# Patient Record
Sex: Female | Born: 1950 | Race: White | Hispanic: No | Marital: Married | Smoking: Never smoker
Health system: Southern US, Community
[De-identification: ages and names within clinical notes are randomized; demographics above are authoritative.]

## PROBLEM LIST (undated history)

## (undated) DIAGNOSIS — R0989 Other specified symptoms and signs involving the circulatory and respiratory systems: Secondary | ICD-10-CM

## (undated) DIAGNOSIS — I10 Essential (primary) hypertension: Secondary | ICD-10-CM

## (undated) DIAGNOSIS — M1990 Unspecified osteoarthritis, unspecified site: Secondary | ICD-10-CM

## (undated) DIAGNOSIS — F411 Generalized anxiety disorder: Secondary | ICD-10-CM

## (undated) DIAGNOSIS — K219 Gastro-esophageal reflux disease without esophagitis: Secondary | ICD-10-CM

## (undated) DIAGNOSIS — C4491 Basal cell carcinoma of skin, unspecified: Secondary | ICD-10-CM

## (undated) DIAGNOSIS — R112 Nausea with vomiting, unspecified: Secondary | ICD-10-CM

## (undated) DIAGNOSIS — T7840XA Allergy, unspecified, initial encounter: Secondary | ICD-10-CM

## (undated) DIAGNOSIS — I441 Atrioventricular block, second degree: Secondary | ICD-10-CM

## (undated) DIAGNOSIS — Z8719 Personal history of other diseases of the digestive system: Secondary | ICD-10-CM

## (undated) DIAGNOSIS — J189 Pneumonia, unspecified organism: Secondary | ICD-10-CM

## (undated) DIAGNOSIS — J309 Allergic rhinitis, unspecified: Secondary | ICD-10-CM

## (undated) DIAGNOSIS — E785 Hyperlipidemia, unspecified: Secondary | ICD-10-CM

## (undated) DIAGNOSIS — D509 Iron deficiency anemia, unspecified: Secondary | ICD-10-CM

## (undated) DIAGNOSIS — R519 Headache, unspecified: Secondary | ICD-10-CM

## (undated) DIAGNOSIS — G8929 Other chronic pain: Secondary | ICD-10-CM

## (undated) HISTORY — DX: Unspecified osteoarthritis, unspecified site: M19.90

## (undated) HISTORY — DX: Allergy, unspecified, initial encounter: T78.40XA

## (undated) HISTORY — DX: Basal cell carcinoma of skin, unspecified: C44.91

## (undated) HISTORY — DX: Hyperlipidemia, unspecified: E78.5

## (undated) HISTORY — DX: Essential (primary) hypertension: I10

## (undated) HISTORY — DX: Headache, unspecified: R51.9

## (undated) HISTORY — DX: Generalized anxiety disorder: F41.1

## (undated) HISTORY — DX: Atrioventricular block, second degree: I44.1

## (undated) HISTORY — DX: Other chronic pain: G89.29

## (undated) HISTORY — DX: Other specified symptoms and signs involving the circulatory and respiratory systems: R09.89

## (undated) HISTORY — DX: Personal history of other diseases of the digestive system: Z87.19

## (undated) HISTORY — DX: Allergic rhinitis, unspecified: J30.9

## (undated) HISTORY — DX: Iron deficiency anemia, unspecified: D50.9

---
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
## 1969-12-10 HISTORY — PX: INGUINAL HERNIA REPAIR: SUR1180

## 1980-12-10 HISTORY — PX: TUBAL LIGATION: SHX77

## 1984-12-10 HISTORY — PX: KNEE CARTILAGE SURGERY: SHX688

## 1988-12-10 HISTORY — PX: VAGINAL HYSTERECTOMY: SUR661

## 1992-12-10 HISTORY — PX: LAPAROSCOPIC CHOLECYSTECTOMY: SUR755

## 1992-12-10 HISTORY — PX: ESOPHAGOGASTRODUODENOSCOPY: SHX1529

## 1999-06-08 ENCOUNTER — Other Ambulatory Visit: Admission: RE | Admit: 1999-06-08 | Discharge: 1999-06-08 | Payer: Self-pay | Admitting: Obstetrics and Gynecology

## 1999-12-11 HISTORY — PX: ANTERIOR CRUCIATE LIGAMENT REPAIR: SHX115

## 2000-01-24 ENCOUNTER — Encounter: Admission: RE | Admit: 2000-01-24 | Discharge: 2000-01-24 | Payer: Self-pay | Admitting: Infectious Diseases

## 2000-01-31 ENCOUNTER — Encounter: Admission: RE | Admit: 2000-01-31 | Discharge: 2000-01-31 | Payer: Self-pay | Admitting: Infectious Diseases

## 2000-02-21 ENCOUNTER — Encounter: Admission: RE | Admit: 2000-02-21 | Discharge: 2000-02-21 | Payer: Self-pay | Admitting: *Deleted

## 2000-05-07 ENCOUNTER — Ambulatory Visit (HOSPITAL_BASED_OUTPATIENT_CLINIC_OR_DEPARTMENT_OTHER): Admission: RE | Admit: 2000-05-07 | Discharge: 2000-05-08 | Payer: Self-pay | Admitting: Orthopedic Surgery

## 2000-05-09 ENCOUNTER — Emergency Department (HOSPITAL_COMMUNITY): Admission: EM | Admit: 2000-05-09 | Discharge: 2000-05-09 | Payer: Self-pay | Admitting: Emergency Medicine

## 2000-07-09 ENCOUNTER — Other Ambulatory Visit: Admission: RE | Admit: 2000-07-09 | Discharge: 2000-07-09 | Payer: Self-pay | Admitting: Obstetrics and Gynecology

## 2000-12-10 HISTORY — PX: BREAST LUMPECTOMY: SHX2

## 2001-02-07 ENCOUNTER — Other Ambulatory Visit: Admission: RE | Admit: 2001-02-07 | Discharge: 2001-02-07 | Payer: Self-pay | Admitting: *Deleted

## 2001-02-07 ENCOUNTER — Encounter (INDEPENDENT_AMBULATORY_CARE_PROVIDER_SITE_OTHER): Payer: Self-pay | Admitting: Specialist

## 2001-03-27 ENCOUNTER — Encounter: Payer: Self-pay | Admitting: Occupational Medicine

## 2001-03-27 ENCOUNTER — Encounter: Admission: RE | Admit: 2001-03-27 | Discharge: 2001-03-27 | Payer: Self-pay | Admitting: Occupational Medicine

## 2001-07-22 ENCOUNTER — Other Ambulatory Visit: Admission: RE | Admit: 2001-07-22 | Discharge: 2001-07-22 | Payer: Self-pay | Admitting: Obstetrics and Gynecology

## 2002-01-20 ENCOUNTER — Ambulatory Visit (HOSPITAL_COMMUNITY): Admission: RE | Admit: 2002-01-20 | Discharge: 2002-01-20 | Payer: Self-pay | Admitting: Gastroenterology

## 2005-03-20 ENCOUNTER — Ambulatory Visit: Payer: Self-pay | Admitting: Internal Medicine

## 2005-08-30 ENCOUNTER — Ambulatory Visit: Payer: Self-pay | Admitting: Internal Medicine

## 2005-09-10 ENCOUNTER — Ambulatory Visit: Payer: Self-pay | Admitting: Internal Medicine

## 2005-09-25 ENCOUNTER — Ambulatory Visit: Payer: Self-pay | Admitting: Internal Medicine

## 2006-10-15 ENCOUNTER — Ambulatory Visit: Payer: Self-pay | Admitting: Internal Medicine

## 2006-10-15 LAB — CONVERTED CEMR LAB
ALT: 21 units/L (ref 0–40)
AST: 23 units/L (ref 0–37)
Albumin: 3.7 g/dL (ref 3.5–5.2)
Alkaline Phosphatase: 57 units/L (ref 39–117)
BUN: 15 mg/dL (ref 6–23)
Basophils Absolute: 0 10*3/uL (ref 0.0–0.1)
Basophils Relative: 1 % (ref 0.0–1.0)
Bilirubin Urine: NEGATIVE
CO2: 29 meq/L (ref 19–32)
Calcium: 9.2 mg/dL (ref 8.4–10.5)
Chloride: 106 meq/L (ref 96–112)
Chol/HDL Ratio, serum: 2.7
Cholesterol: 177 mg/dL (ref 0–200)
Creatinine, Ser: 0.7 mg/dL (ref 0.4–1.2)
Eosinophil percent: 7.1 % — ABNORMAL HIGH (ref 0.0–5.0)
GFR calc non Af Amer: 92 mL/min
Glomerular Filtration Rate, Af Am: 111 mL/min/{1.73_m2}
Glucose, Bld: 90 mg/dL (ref 70–99)
HCT: 42.1 % (ref 36.0–46.0)
HDL: 66.2 mg/dL (ref >39.0)
Hemoglobin: 14.1 g/dL (ref 12.0–15.0)
Ketones, ur: NEGATIVE mg/dL
LDL Cholesterol: 99 mg/dL (ref 0–99)
Leukocytes, UA: NEGATIVE
Lymphocytes Relative: 30 % (ref 12.0–46.0)
MCHC: 33.4 g/dL (ref 30.0–36.0)
MCV: 95.4 fL (ref 78.0–100.0)
Monocytes Absolute: 0.5 10*3/uL (ref 0.2–0.7)
Monocytes Relative: 15.4 % — ABNORMAL HIGH (ref 3.0–11.0)
Neutro Abs: 1.7 10*3/uL (ref 1.4–7.7)
Neutrophils Relative %: 46.5 % (ref 43.0–77.0)
Nitrite: NEGATIVE
Platelets: 344 10*3/uL (ref 150–400)
Potassium: 4.2 meq/L (ref 3.5–5.1)
RBC: 4.41 M/uL (ref 3.87–5.11)
RDW: 12.4 % (ref 11.5–14.6)
Sodium: 140 meq/L (ref 135–145)
Specific Gravity, Urine: 1.02 (ref 1.000–1.03)
TSH: 2.38 microintl units/mL (ref 0.35–5.50)
Total Bilirubin: 0.6 mg/dL (ref 0.3–1.2)
Total Protein, Urine: NEGATIVE mg/dL
Total Protein: 6.3 g/dL (ref 6.0–8.3)
Triglyceride fasting, serum: 59 mg/dL (ref 0–149)
Urine Glucose: NEGATIVE mg/dL
Urobilinogen, UA: 0.2 (ref 0.0–1.0)
VLDL: 12 mg/dL (ref 0–40)
WBC: 3.4 10*3/uL — ABNORMAL LOW (ref 4.5–10.5)
pH: 6 (ref 5.0–8.0)

## 2006-10-22 ENCOUNTER — Ambulatory Visit: Payer: Self-pay | Admitting: Internal Medicine

## 2007-03-20 ENCOUNTER — Ambulatory Visit: Payer: Self-pay | Admitting: Internal Medicine

## 2007-09-08 ENCOUNTER — Ambulatory Visit: Payer: Self-pay | Admitting: Internal Medicine

## 2007-09-11 ENCOUNTER — Ambulatory Visit: Payer: Self-pay | Admitting: Internal Medicine

## 2007-09-19 ENCOUNTER — Encounter: Payer: Self-pay | Admitting: Internal Medicine

## 2007-09-19 ENCOUNTER — Ambulatory Visit: Payer: Self-pay | Admitting: Internal Medicine

## 2007-09-19 DIAGNOSIS — D509 Iron deficiency anemia, unspecified: Secondary | ICD-10-CM

## 2007-09-19 DIAGNOSIS — F419 Anxiety disorder, unspecified: Secondary | ICD-10-CM | POA: Insufficient documentation

## 2007-09-19 DIAGNOSIS — J309 Allergic rhinitis, unspecified: Secondary | ICD-10-CM | POA: Insufficient documentation

## 2007-09-19 DIAGNOSIS — J069 Acute upper respiratory infection, unspecified: Secondary | ICD-10-CM | POA: Insufficient documentation

## 2007-09-19 DIAGNOSIS — F411 Generalized anxiety disorder: Secondary | ICD-10-CM

## 2007-09-19 DIAGNOSIS — E785 Hyperlipidemia, unspecified: Secondary | ICD-10-CM | POA: Insufficient documentation

## 2007-09-19 DIAGNOSIS — M1990 Unspecified osteoarthritis, unspecified site: Secondary | ICD-10-CM

## 2007-09-19 DIAGNOSIS — R21 Rash and other nonspecific skin eruption: Secondary | ICD-10-CM | POA: Insufficient documentation

## 2007-09-19 HISTORY — DX: Generalized anxiety disorder: F41.1

## 2007-09-19 HISTORY — DX: Allergic rhinitis, unspecified: J30.9

## 2007-09-19 HISTORY — DX: Iron deficiency anemia, unspecified: D50.9

## 2007-09-19 HISTORY — DX: Unspecified osteoarthritis, unspecified site: M19.90

## 2007-10-28 ENCOUNTER — Ambulatory Visit: Payer: Self-pay | Admitting: Internal Medicine

## 2007-10-28 ENCOUNTER — Encounter: Payer: Self-pay | Admitting: Internal Medicine

## 2007-11-07 ENCOUNTER — Ambulatory Visit: Payer: Self-pay | Admitting: Internal Medicine

## 2007-11-07 LAB — CONVERTED CEMR LAB
ALT: 26 units/L (ref 0–35)
AST: 22 units/L (ref 0–37)
Albumin: 3.9 g/dL (ref 3.5–5.2)
Alkaline Phosphatase: 53 units/L (ref 39–117)
BUN: 13 mg/dL (ref 6–23)
Basophils Absolute: 0 10*3/uL (ref 0.0–0.1)
Basophils Relative: 0.9 % (ref 0.0–1.0)
Bilirubin Urine: NEGATIVE
Bilirubin, Direct: 0.1 mg/dL (ref 0.0–0.3)
CO2: 31 meq/L (ref 19–32)
Calcium: 9.1 mg/dL (ref 8.4–10.5)
Chloride: 104 meq/L (ref 96–112)
Cholesterol: 178 mg/dL (ref 0–200)
Creatinine, Ser: 0.7 mg/dL (ref 0.4–1.2)
Eosinophils Absolute: 0.2 10*3/uL (ref 0.0–0.6)
Eosinophils Relative: 3.9 % (ref 0.0–5.0)
GFR calc Af Amer: 111 mL/min
GFR calc non Af Amer: 91 mL/min
Glucose, Bld: 84 mg/dL (ref 70–99)
HCT: 39.8 % (ref 36.0–46.0)
HDL: 82 mg/dL (ref >39.0)
Hemoglobin: 14.2 g/dL (ref 12.0–15.0)
Ketones, ur: NEGATIVE mg/dL
LDL Cholesterol: 85 mg/dL (ref 0–99)
Leukocytes, UA: NEGATIVE
Lymphocytes Relative: 29 % (ref 12.0–46.0)
MCHC: 35.7 g/dL (ref 30.0–36.0)
MCV: 95.1 fL (ref 78.0–100.0)
Monocytes Absolute: 0.6 10*3/uL (ref 0.2–0.7)
Monocytes Relative: 14.6 % — ABNORMAL HIGH (ref 3.0–11.0)
Neutro Abs: 2.2 10*3/uL (ref 1.4–7.7)
Neutrophils Relative %: 51.6 % (ref 43.0–77.0)
Nitrite: NEGATIVE
Platelets: 308 10*3/uL (ref 150–400)
Potassium: 3.8 meq/L (ref 3.5–5.1)
RBC: 4.19 M/uL (ref 3.87–5.11)
RDW: 12.9 % (ref 11.5–14.6)
Sodium: 141 meq/L (ref 135–145)
Specific Gravity, Urine: >=1.03 (ref 1.000–1.03)
TSH: 2.33 microintl units/mL (ref 0.35–5.50)
Total Bilirubin: 0.6 mg/dL (ref 0.3–1.2)
Total CHOL/HDL Ratio: 2.2
Total Protein, Urine: NEGATIVE mg/dL
Total Protein: 6.2 g/dL (ref 6.0–8.3)
Triglycerides: 53 mg/dL (ref 0–149)
Urine Glucose: NEGATIVE mg/dL
Urobilinogen, UA: 0.2 (ref 0.0–1.0)
VLDL: 11 mg/dL (ref 0–40)
WBC: 4.2 10*3/uL — ABNORMAL LOW (ref 4.5–10.5)
pH: 5 (ref 5.0–8.0)

## 2007-11-19 ENCOUNTER — Ambulatory Visit: Payer: Self-pay | Admitting: Internal Medicine

## 2007-11-19 DIAGNOSIS — J329 Chronic sinusitis, unspecified: Secondary | ICD-10-CM | POA: Insufficient documentation

## 2008-02-23 ENCOUNTER — Encounter: Payer: Self-pay | Admitting: Internal Medicine

## 2008-12-15 ENCOUNTER — Telehealth: Payer: Self-pay | Admitting: Internal Medicine

## 2008-12-15 ENCOUNTER — Encounter: Payer: Self-pay | Admitting: Internal Medicine

## 2008-12-15 ENCOUNTER — Ambulatory Visit: Payer: Self-pay | Admitting: Internal Medicine

## 2008-12-15 DIAGNOSIS — J209 Acute bronchitis, unspecified: Secondary | ICD-10-CM | POA: Insufficient documentation

## 2008-12-15 DIAGNOSIS — R030 Elevated blood-pressure reading, without diagnosis of hypertension: Secondary | ICD-10-CM | POA: Insufficient documentation

## 2008-12-15 DIAGNOSIS — J45991 Cough variant asthma: Secondary | ICD-10-CM | POA: Insufficient documentation

## 2008-12-15 DIAGNOSIS — L500 Allergic urticaria: Secondary | ICD-10-CM | POA: Insufficient documentation

## 2008-12-20 ENCOUNTER — Ambulatory Visit: Payer: Self-pay | Admitting: Internal Medicine

## 2008-12-20 LAB — CONVERTED CEMR LAB
ALT: 16 units/L (ref 0–35)
AST: 20 units/L (ref 0–37)
Albumin: 4.1 g/dL (ref 3.5–5.2)
Alkaline Phosphatase: 62 units/L (ref 39–117)
BUN: 12 mg/dL (ref 6–23)
Basophils Absolute: 0 10*3/uL (ref 0.0–0.1)
Basophils Relative: 0.9 % (ref 0.0–3.0)
Bilirubin Urine: NEGATIVE
Bilirubin, Direct: 0.1 mg/dL (ref 0.0–0.3)
CO2: 31 meq/L (ref 19–32)
Calcium: 9.6 mg/dL (ref 8.4–10.5)
Chloride: 105 meq/L (ref 96–112)
Cholesterol: 172 mg/dL (ref 0–200)
Creatinine, Ser: 0.7 mg/dL (ref 0.4–1.2)
Eosinophils Absolute: 0.1 10*3/uL (ref 0.0–0.7)
Eosinophils Relative: 2.6 % (ref 0.0–5.0)
GFR calc Af Amer: 110 mL/min
GFR calc non Af Amer: 91 mL/min
Glucose, Bld: 95 mg/dL (ref 70–99)
HCT: 42.8 % (ref 36.0–46.0)
HDL: 61.6 mg/dL (ref >39.0)
Hemoglobin, Urine: NEGATIVE
Hemoglobin: 15.1 g/dL — ABNORMAL HIGH (ref 12.0–15.0)
Ketones, ur: NEGATIVE mg/dL
LDL Cholesterol: 97 mg/dL (ref 0–99)
Leukocytes, UA: NEGATIVE
Lymphocytes Relative: 21.9 % (ref 12.0–46.0)
MCHC: 35.2 g/dL (ref 30.0–36.0)
MCV: 95.1 fL (ref 78.0–100.0)
Monocytes Absolute: 0.4 10*3/uL (ref 0.1–1.0)
Monocytes Relative: 7.3 % (ref 3.0–12.0)
Neutro Abs: 3.5 10*3/uL (ref 1.4–7.7)
Neutrophils Relative %: 67.3 % (ref 43.0–77.0)
Nitrite: NEGATIVE
Platelets: 331 10*3/uL (ref 150–400)
Potassium: 4.7 meq/L (ref 3.5–5.1)
RBC: 4.5 M/uL (ref 3.87–5.11)
RDW: 12.4 % (ref 11.5–14.6)
Sodium: 141 meq/L (ref 135–145)
Specific Gravity, Urine: 1.025 (ref 1.000–1.03)
TSH: 1.96 microintl units/mL (ref 0.35–5.50)
Total Bilirubin: 0.8 mg/dL (ref 0.3–1.2)
Total CHOL/HDL Ratio: 2.8
Total Protein, Urine: NEGATIVE mg/dL
Total Protein: 6.5 g/dL (ref 6.0–8.3)
Triglycerides: 69 mg/dL (ref 0–149)
Urine Glucose: NEGATIVE mg/dL
Urobilinogen, UA: 0.2 (ref 0.0–1.0)
VLDL: 14 mg/dL (ref 0–40)
WBC: 5.1 10*3/uL (ref 4.5–10.5)
pH: 5.5 (ref 5.0–8.0)

## 2008-12-27 ENCOUNTER — Ambulatory Visit: Payer: Self-pay | Admitting: Internal Medicine

## 2008-12-27 DIAGNOSIS — L509 Urticaria, unspecified: Secondary | ICD-10-CM | POA: Insufficient documentation

## 2009-05-02 ENCOUNTER — Ambulatory Visit: Payer: Self-pay | Admitting: Internal Medicine

## 2009-05-02 DIAGNOSIS — H6120 Impacted cerumen, unspecified ear: Secondary | ICD-10-CM | POA: Insufficient documentation

## 2009-05-02 DIAGNOSIS — H6690 Otitis media, unspecified, unspecified ear: Secondary | ICD-10-CM | POA: Insufficient documentation

## 2009-05-23 ENCOUNTER — Telehealth: Payer: Self-pay | Admitting: Internal Medicine

## 2009-06-22 ENCOUNTER — Telehealth: Payer: Self-pay | Admitting: Internal Medicine

## 2009-06-30 ENCOUNTER — Encounter (INDEPENDENT_AMBULATORY_CARE_PROVIDER_SITE_OTHER): Payer: Self-pay | Admitting: *Deleted

## 2009-10-05 ENCOUNTER — Ambulatory Visit: Payer: Self-pay | Admitting: Internal Medicine

## 2009-12-30 ENCOUNTER — Ambulatory Visit: Payer: Self-pay | Admitting: Internal Medicine

## 2010-01-02 ENCOUNTER — Ambulatory Visit: Payer: Self-pay | Admitting: Internal Medicine

## 2010-01-02 LAB — CONVERTED CEMR LAB
ALT: 19 units/L (ref 0–35)
AST: 20 units/L (ref 0–37)
Albumin: 3.8 g/dL (ref 3.5–5.2)
Alkaline Phosphatase: 74 units/L (ref 39–117)
BUN: 12 mg/dL (ref 6–23)
Basophils Absolute: 0.1 10*3/uL (ref 0.0–0.1)
Basophils Relative: 1.4 % (ref 0.0–3.0)
Bilirubin Urine: NEGATIVE
Bilirubin, Direct: 0.1 mg/dL (ref 0.0–0.3)
CO2: 30 meq/L (ref 19–32)
Calcium: 9.3 mg/dL (ref 8.4–10.5)
Chloride: 105 meq/L (ref 96–112)
Cholesterol: 160 mg/dL (ref 0–200)
Creatinine, Ser: 0.7 mg/dL (ref 0.4–1.2)
Eosinophils Absolute: 0.3 10*3/uL (ref 0.0–0.7)
Eosinophils Relative: 6.8 % — ABNORMAL HIGH (ref 0.0–5.0)
GFR calc non Af Amer: 90.56 mL/min (ref >60)
Glucose, Bld: 84 mg/dL (ref 70–99)
HCT: 41.4 % (ref 36.0–46.0)
HDL: 57.8 mg/dL (ref >39.00)
Hemoglobin: 14.1 g/dL (ref 12.0–15.0)
Ketones, ur: NEGATIVE mg/dL
LDL Cholesterol: 88 mg/dL (ref 0–99)
Leukocytes, UA: NEGATIVE
Lymphocytes Relative: 24.5 % (ref 12.0–46.0)
Lymphs Abs: 1 10*3/uL (ref 0.7–4.0)
MCHC: 34 g/dL (ref 30.0–36.0)
MCV: 96.6 fL (ref 78.0–100.0)
Monocytes Absolute: 0.4 10*3/uL (ref 0.1–1.0)
Monocytes Relative: 10.5 % (ref 3.0–12.0)
Neutro Abs: 2.1 10*3/uL (ref 1.4–7.7)
Neutrophils Relative %: 56.8 % (ref 43.0–77.0)
Nitrite: NEGATIVE
Platelets: 306 10*3/uL (ref 150.0–400.0)
Potassium: 4.4 meq/L (ref 3.5–5.1)
RBC: 4.29 M/uL (ref 3.87–5.11)
RDW: 12.6 % (ref 11.5–14.6)
Sodium: 141 meq/L (ref 135–145)
Specific Gravity, Urine: 1.025 (ref 1.000–1.030)
TSH: 1.65 microintl units/mL (ref 0.35–5.50)
Total Bilirubin: 0.7 mg/dL (ref 0.3–1.2)
Total CHOL/HDL Ratio: 3
Total Protein, Urine: NEGATIVE mg/dL
Total Protein: 6.4 g/dL (ref 6.0–8.3)
Triglycerides: 73 mg/dL (ref 0.0–149.0)
Urine Glucose: NEGATIVE mg/dL
Urobilinogen, UA: 0.2 (ref 0.0–1.0)
VLDL: 14.6 mg/dL (ref 0.0–40.0)
WBC: 3.9 10*3/uL — ABNORMAL LOW (ref 4.5–10.5)
pH: 5.5 (ref 5.0–8.0)

## 2010-01-06 ENCOUNTER — Ambulatory Visit: Payer: Self-pay | Admitting: Internal Medicine

## 2010-01-06 DIAGNOSIS — M25519 Pain in unspecified shoulder: Secondary | ICD-10-CM | POA: Insufficient documentation

## 2010-01-06 DIAGNOSIS — M79609 Pain in unspecified limb: Secondary | ICD-10-CM | POA: Insufficient documentation

## 2010-01-23 ENCOUNTER — Ambulatory Visit: Payer: Self-pay | Admitting: Internal Medicine

## 2010-01-23 ENCOUNTER — Ambulatory Visit: Payer: Self-pay | Admitting: Family Medicine

## 2010-01-25 ENCOUNTER — Telehealth: Payer: Self-pay | Admitting: Internal Medicine

## 2010-01-25 DIAGNOSIS — R930 Abnormal findings on diagnostic imaging of skull and head, not elsewhere classified: Secondary | ICD-10-CM | POA: Insufficient documentation

## 2010-01-27 ENCOUNTER — Ambulatory Visit: Payer: Self-pay | Admitting: Cardiology

## 2010-01-27 ENCOUNTER — Telehealth: Payer: Self-pay | Admitting: Internal Medicine

## 2010-07-14 ENCOUNTER — Encounter: Payer: Self-pay | Admitting: Internal Medicine

## 2010-07-24 ENCOUNTER — Ambulatory Visit: Payer: Self-pay | Admitting: Internal Medicine

## 2010-07-24 DIAGNOSIS — R51 Headache: Secondary | ICD-10-CM

## 2010-09-08 ENCOUNTER — Ambulatory Visit: Payer: Self-pay | Admitting: Internal Medicine

## 2010-09-20 ENCOUNTER — Encounter: Payer: Self-pay | Admitting: Internal Medicine

## 2011-01-01 ENCOUNTER — Ambulatory Visit
Admission: RE | Admit: 2011-01-01 | Discharge: 2011-01-01 | Payer: Self-pay | Source: Home / Self Care | Attending: Internal Medicine | Admitting: Internal Medicine

## 2011-01-01 ENCOUNTER — Other Ambulatory Visit: Payer: Self-pay | Admitting: Internal Medicine

## 2011-01-01 LAB — URINALYSIS
Bilirubin Urine: NEGATIVE
Ketones, ur: NEGATIVE
Leukocytes, UA: NEGATIVE
Nitrite: NEGATIVE
Specific Gravity, Urine: <=1.005 (ref 1.000–1.030)
Total Protein, Urine: NEGATIVE
Urine Glucose: NEGATIVE
Urobilinogen, UA: 0.2 (ref 0.0–1.0)
pH: 6.5 (ref 5.0–8.0)

## 2011-01-01 LAB — HEPATIC FUNCTION PANEL
ALT: 25 U/L (ref 0–35)
AST: 27 U/L (ref 0–37)
Albumin: 4.2 g/dL (ref 3.5–5.2)
Alkaline Phosphatase: 78 U/L (ref 39–117)
Bilirubin, Direct: 0.1 mg/dL (ref 0.0–0.3)
Total Bilirubin: 0.7 mg/dL (ref 0.3–1.2)
Total Protein: 6.5 g/dL (ref 6.0–8.3)

## 2011-01-01 LAB — LIPID PANEL
Cholesterol: 173 mg/dL (ref 0–200)
HDL: 68.5 mg/dL (ref >39.00)
LDL Cholesterol: 90 mg/dL (ref 0–99)
Total CHOL/HDL Ratio: 3
Triglycerides: 74 mg/dL (ref 0.0–149.0)
VLDL: 14.8 mg/dL (ref 0.0–40.0)

## 2011-01-01 LAB — CBC WITH DIFFERENTIAL/PLATELET
Basophils Absolute: 0 10*3/uL (ref 0.0–0.1)
Basophils Relative: 0.6 % (ref 0.0–3.0)
Eosinophils Absolute: 0.2 10*3/uL (ref 0.0–0.7)
Eosinophils Relative: 4.5 % (ref 0.0–5.0)
HCT: 42.6 % (ref 36.0–46.0)
Hemoglobin: 14.7 g/dL (ref 12.0–15.0)
Lymphocytes Relative: 23 % (ref 12.0–46.0)
Lymphs Abs: 1.1 10*3/uL (ref 0.7–4.0)
MCHC: 34.4 g/dL (ref 30.0–36.0)
MCV: 95.7 fl (ref 78.0–100.0)
Monocytes Absolute: 0.6 10*3/uL (ref 0.1–1.0)
Monocytes Relative: 11.5 % (ref 3.0–12.0)
Neutro Abs: 3 10*3/uL (ref 1.4–7.7)
Neutrophils Relative %: 60.4 % (ref 43.0–77.0)
Platelets: 323 10*3/uL (ref 150.0–400.0)
RBC: 4.45 Mil/uL (ref 3.87–5.11)
RDW: 12.8 % (ref 11.5–14.6)
WBC: 4.9 10*3/uL (ref 4.5–10.5)

## 2011-01-01 LAB — BASIC METABOLIC PANEL
BUN: 11 mg/dL (ref 6–23)
CO2: 29 mEq/L (ref 19–32)
Calcium: 9.8 mg/dL (ref 8.4–10.5)
Chloride: 104 mEq/L (ref 96–112)
Creatinine, Ser: 0.6 mg/dL (ref 0.4–1.2)
GFR: 103.83 mL/min (ref >60.00)
Glucose, Bld: 81 mg/dL (ref 70–99)
Potassium: 4.7 mEq/L (ref 3.5–5.1)
Sodium: 140 mEq/L (ref 135–145)

## 2011-01-01 LAB — TSH: TSH: 2.01 u[IU]/mL (ref 0.35–5.50)

## 2011-01-08 ENCOUNTER — Encounter: Payer: Self-pay | Admitting: Internal Medicine

## 2011-01-08 ENCOUNTER — Ambulatory Visit
Admission: RE | Admit: 2011-01-08 | Discharge: 2011-01-08 | Payer: Self-pay | Source: Home / Self Care | Attending: Internal Medicine | Admitting: Internal Medicine

## 2011-01-08 DIAGNOSIS — R0989 Other specified symptoms and signs involving the circulatory and respiratory systems: Secondary | ICD-10-CM | POA: Insufficient documentation

## 2011-01-08 HISTORY — DX: Other specified symptoms and signs involving the circulatory and respiratory systems: R09.89

## 2011-01-09 ENCOUNTER — Encounter: Payer: Self-pay | Admitting: Internal Medicine

## 2011-01-09 NOTE — Assessment & Plan Note (Signed)
Summary: SINUS PROBLEM--STC   Vital Signs:  Patient profile:   60 year old female Height:      64 inches Weight:      170 pounds BMI:     29.29 O2 Sat:      97 % on Room air Temp:     98.3 degrees F oral Pulse rate:   76 / minute Pulse rhythm:   regular Resp:     16 per minute BP sitting:   120 / 60  (left arm) Cuff size:   regular  Vitals Entered By: Lanier Prude, CMA(AAMA) (July 24, 2010 4:18 PM)  O2 Flow:  Room air CC: sinus pain/pressure X 2 wks Is Patient Diabetic? No   CC:  sinus pain/pressure X 2 wks.  History of Present Illness: C/o severe constant HA in L eye, temple and L maxilla x 3-4 d like before w/sinusitis. OTC meds did not help She has a question about her BDS She is allergic to red dye F/u urticaria  Current Medications (verified): 1)  Aspir-Low 81 Mg Tbec (Aspirin) .Marland Kitchen.. 1 Once Daily Pc 2)  Alprazolam 0.25 Mg Tabs (Alprazolam) .Marland Kitchen.. 1 Two Times A Day Prn 3)  Vitamin D3 1000 Unit  Tabs (Cholecalciferol) .Marland Kitchen.. 1 Qd 4)  Etodolac Cr 500 Mg  Tb24 (Etodolac) .Marland Kitchen.. 1 By Mouth Once Daily Pc 5)  Lipitor 20 Mg Tabs (Atorvastatin Calcium) .Marland Kitchen.. 1po Qd 6)  Epipen 2-Pak 0.3 Mg/0.5ml (1:1000) Devi (Epinephrine Hcl (Anaphylaxis)) .... As Dirr 7)  Hydrocodone-Homatropine 5-1.5 Mg/60ml Syrp (Hydrocodone-Homatropine) .Marland Kitchen.. 1 Tsp By Mouth Q 6 Hrs As Needed Cough 8)  Anti-Hist Allergy 25 Mg Tabs (Diphenhydramine Hcl) .... 2 Tabs Q4 H As Needed Hives 9)  Triamcinolone Acetonide 0.5 % Crea (Triamcinolone Acetonide) .... Apply Bid To Affected Area 10)  Fluticasone Propionate 50 Mcg/act  Susp (Fluticasone Propionate) .... 2 Sprays Each Nostril Once Daily 11)  Prednisone 10 Mg Tabs (Prednisone) .... Take 40mg  Qd For 3 Days, Then 20 Mg Qd For 3 Days, Then 10mg  Qd For 6 Days, Then Stop. Take Pc.  Allergies (verified): 1)  ! Sulfa 2)  ! Biaxin 3)  ! Food Color Red (Dye Fdc Red 40 (Allura Red)) 4)  ! * Depo Medrol  Review of Systems  The patient denies fever, hoarseness,  dyspnea on exertion, and abdominal pain.    Physical Exam  General:  alert and well-developed.    Head:  Normocephalic and atraumatic without obvious abnormalities. No apparent alopecia or balding. NT. Eyes:  No corneal or conjunctival inflammation noted. EOMI. Perrla.  Ears:  R TM w/slight bulging Nose:  External nasal examination shows no deformity or inflammation. Nasal mucosa are pink and moist without lesions or exudates. Mouth:  WNL Neck:  supple and cervical lymphadenopathy.   Lungs:  Normal respiratory effort, chest expands symmetrically. Lungs are clear to auscultation, no crackles or wheezes. Heart:  Normal rate and regular rhythm. S1 and S2 normal without gallop, murmur, click, rub or other extra sounds. Skin:  Intact without suspicious lesions or rashes Psych:  Cognition and judgment appear intact. Alert and cooperative with normal attention span and concentration. No apparent delusions, illusions, hallucinations   Impression & Recommendations:  Problem # 1:  SINUSITIS, ACUTE (ICD-461.9) R maxillary Assessment New  Her updated medication list for this problem includes:    Hydrocodone-homatropine 5-1.5 Mg/46ml Syrp (Hydrocodone-homatropine) .Marland Kitchen... 1 tsp by mouth q 6 hrs as needed cough    Fluticasone Propionate 50 Mcg/act Susp (Fluticasone propionate) .Marland Kitchen... 2 sprays  each nostril once daily    Ceftin 250 Mg Tabs (Cefuroxime axetil) .Marland Kitchen... 2 by mouth two times a day for sinus infection    Sudafed 12 Hour 120 Mg Xr12h-tab (Pseudoephedrine hcl) .Marland Kitchen... 1 by mouth two times a day as needed allergies  Orders: Depo- Medrol 40mg  (J1030) Admin of Therapeutic Inj  intramuscular or subcutaneous ([address])  Problem # 2:  HEADACHE (ICD-784.0) R due to #1 Assessment: New  Her updated medication list for this problem includes:    Aspir-low 81 Mg Tbec (Aspirin) .Marland Kitchen... 1 once daily pc    Etodolac Cr 500 Mg Tb24 (Etodolac) .Marland Kitchen... 1 by mouth once daily pc  Problem # 3:  URTICARIA  (ICD-708.9) Assessment: Comment Only Take 40mg  qd for 3 days, then 20 mg qd for 3 days, then 10mg  qd for 6 days, then stop. Take pc.  - use PRN  Problem # 4:  BDS report was OK Discussed  Complete Medication List: 1)  Aspir-low 81 Mg Tbec (Aspirin) .Marland Kitchen.. 1 once daily pc 2)  Alprazolam 0.25 Mg Tabs (Alprazolam) .Marland Kitchen.. 1 two times a day prn 3)  Vitamin D3 1000 Unit Tabs (Cholecalciferol) .Marland Kitchen.. 1 qd 4)  Etodolac Cr 500 Mg Tb24 (Etodolac) .Marland Kitchen.. 1 by mouth once daily pc 5)  Lipitor 20 Mg Tabs (Atorvastatin calcium) .Marland Kitchen.. 1po qd 6)  Epipen 2-pak 0.3 Mg/0.67ml (1:1000) Devi (Epinephrine hcl (anaphylaxis)) .... As dirr 7)  Hydrocodone-homatropine 5-1.5 Mg/72ml Syrp (Hydrocodone-homatropine) .Marland Kitchen.. 1 tsp by mouth q 6 hrs as needed cough 8)  Anti-hist Allergy 25 Mg Tabs (Diphenhydramine hcl) .... 2 tabs q4 h as needed hives 9)  Triamcinolone Acetonide 0.5 % Crea (Triamcinolone acetonide) .... Apply bid to affected area 10)  Fluticasone Propionate 50 Mcg/act Susp (Fluticasone propionate) .... 2 sprays each nostril once daily 11)  Prednisone 10 Mg Tabs (Prednisone) .... Take 40mg  qd for 3 days, then 20 mg qd for 3 days, then 10mg  qd for 6 days, then stop. take pc. 12)  Ceftin 250 Mg Tabs (Cefuroxime axetil) .... 2 by mouth two times a day for sinus infection 13)  Sudafed 12 Hour 120 Mg Xr12h-tab (Pseudoephedrine hcl) .Marland Kitchen.. 1 by mouth two times a day as needed allergies  Patient Instructions: 1)  Call if you are not better in a reasonable amount of time or if worse.  Prescriptions: PREDNISONE 10 MG TABS (PREDNISONE) Take 40mg  qd for 3 days, then 20 mg qd for 3 days, then 10mg  qd for 6 days, then stop. Take pc.  #24 x 2   Entered and Authorized by:   Tresa Garter MD   Signed by:   Tresa Garter MD on 07/24/2010   Method used:   Print then Give to Patient   RxID:   [id number] SUDAFED 12 HOUR 120 MG XR12H-TAB (PSEUDOEPHEDRINE HCL) 1 by mouth two times a day as needed allergies  #60 x 1    Entered and Authorized by:   Tresa Garter MD   Signed by:   Tresa Garter MD on 07/24/2010   Method used:   Print then Give to Patient   RxID:   [id number] CEFTIN 250 MG TABS (CEFUROXIME AXETIL) 2 by mouth two times a day for sinus infection  #56 x 1   Entered and Authorized by:   Tresa Garter MD   Signed by:   Tresa Garter MD on 07/24/2010   Method used:   Print then Give to Patient   RxID:   [id number]  Medication Administration  Injection # 1:    Medication: Depo- Medrol 40mg     Diagnosis: SINUSITIS, ACUTE (ICD-461.9)    Route: IM    Site: LUOQ gluteus    Exp Date: 03/10/2013    Lot #: [id number]    Mfr: [employer]    Comments: pt rec 120mg     Patient tolerated injection without complications    Given by: Lanier Prude, CMA(AAMA) (July 24, 2010 5:06 PM)  Injection # 2:    Medication: Depo- Medrol 80mg     Comments: same as above.  pt rec 120mg   Orders Added: 1)  Depo- Medrol 40mg  [J1030] 2)  Admin of Therapeutic Inj  intramuscular or subcutaneous [96372] 3)  Est. Patient Level IV [31517]

## 2011-01-09 NOTE — Assessment & Plan Note (Signed)
Summary: EARACHE--STC   Vital Signs:  Patient profile:   60 year old female Height:      64 inches Weight:      167 pounds BMI:     28.77 Temp:     98.3 degrees F oral Pulse rate:   84 / minute Pulse rhythm:   regular Resp:     16 per minute BP sitting:   118 / 80  (left arm) Cuff size:   regular  Vitals Entered By: Lanier Prude, CMA(AAMA) (September 08, 2010 9:12 AM) CC: Lt ear ache, sinus pain & pressure X 2 wks Is Patient Diabetic? No   CC:  Lt ear ache and sinus pain & pressure X 2 wks.  History of Present Illness: C/o earache L>>R and sinus pain x 1 wk  Preventive Screening-Counseling & Management  Alcohol-Tobacco     Smoking Status: never     Passive Smoke Exposure: no  Current Medications (verified): 1)  Aspir-Low 81 Mg Tbec (Aspirin) .Marland Kitchen.. 1 Once Daily Pc 2)  Alprazolam 0.25 Mg Tabs (Alprazolam) .Marland Kitchen.. 1 Two Times A Day Prn 3)  Vitamin D3 1000 Unit  Tabs (Cholecalciferol) .Marland Kitchen.. 1 Qd 4)  Etodolac Cr 500 Mg  Tb24 (Etodolac) .Marland Kitchen.. 1 By Mouth Once Daily Pc 5)  Lipitor 20 Mg Tabs (Atorvastatin Calcium) .Marland Kitchen.. 1po Qd 6)  Epipen 2-Pak 0.3 Mg/0.47ml (1:1000) Devi (Epinephrine Hcl (Anaphylaxis)) .... As Dirr 7)  Hydrocodone-Homatropine 5-1.5 Mg/43ml Syrp (Hydrocodone-Homatropine) .Marland Kitchen.. 1 Tsp By Mouth Q 6 Hrs As Needed Cough 8)  Anti-Hist Allergy 25 Mg Tabs (Diphenhydramine Hcl) .... 2 Tabs Q4 H As Needed Hives 9)  Triamcinolone Acetonide 0.5 % Crea (Triamcinolone Acetonide) .... Apply Bid To Affected Area 10)  Fluticasone Propionate 50 Mcg/act  Susp (Fluticasone Propionate) .... 2 Sprays Each Nostril Once Daily 11)  Prednisone 10 Mg Tabs (Prednisone) .... Take 40mg  Qd For 3 Days, Then 20 Mg Qd For 3 Days, Then 10mg  Qd For 6 Days, Then Stop. Take Pc. 12)  Sudafed 12 Hour 120 Mg Xr12h-Tab (Pseudoephedrine Hcl) .Marland Kitchen.. 1 By Mouth Two Times A Day As Needed Allergies  Allergies (verified): 1)  ! Sulfa 2)  ! Biaxin 3)  ! Food Color Red (Dye Fdc Red 40 (Allura Red)) 4)  ! 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    Ears:  L TM w/bulging; R TM w/slight bulging Nose:  External nasal examination shows no deformity or inflammation. Nasal mucosa are pink and moist without lesions or exudates. Mouth:  WNL Lungs:  Normal respiratory effort, chest expands symmetrically. Lungs are clear to auscultation, no crackles or wheezes. Heart:  Normal rate and regular rhythm. S1 and S2 normal without gallop, murmur, click, rub or other extra sounds. Skin:  Intact without suspicious lesions or rashes   Impression & Recommendations:  Problem # 1:  OTITIS MEDIA, ACUTE, LEFT (ICD-382.9)L>R - keeps coming back Assessment New  Her updated medication list for this problem includes:    Aspir-low 81 Mg Tbec (Aspirin) .Marland Kitchen... 1 once daily pc    Etodolac Cr 500 Mg Tb24 (Etodolac) .Marland Kitchen... 1 by mouth once daily pc    Ceftin 250 Mg Tabs (Cefuroxime axetil) .Marland Kitchen... 2 by mouth bid  Orders: ENT Referral (ENT) ?? ear tubes  Problem # 2:  HEADACHE (ICD-784.0) Assessment: Deteriorated  Her updated medication list  for this problem includes:    Aspir-low 81 Mg Tbec (Aspirin) .Marland Kitchen... 1 once daily pc    Etodolac Cr 500 Mg Tb24 (Etodolac) .Marland Kitchen... 1 by mouth once daily pc  Complete Medication List: 1)  Aspir-low 81 Mg Tbec (Aspirin) .Marland Kitchen.. 1 once daily pc 2)  Alprazolam 0.25 Mg Tabs (Alprazolam) .Marland Kitchen.. 1 two times a day prn 3)  Vitamin D3 1000 Unit Tabs (Cholecalciferol) .Marland Kitchen.. 1 qd 4)  Etodolac Cr 500 Mg Tb24 (Etodolac) .Marland Kitchen.. 1 by mouth once daily pc 5)  Lipitor 20 Mg Tabs (Atorvastatin calcium) .Marland Kitchen.. 1po qd 6)  Epipen 2-pak 0.3 Mg/0.51ml (1:1000) Devi (Epinephrine hcl (anaphylaxis)) .... As dirr 7)  Hydrocodone-homatropine 5-1.5 Mg/42ml Syrp (Hydrocodone-homatropine) .Marland Kitchen.. 1 tsp by mouth q 6 hrs as needed  cough 8)  Anti-hist Allergy 25 Mg Tabs (Diphenhydramine hcl) .... 2 tabs q4 h as needed hives 9)  Triamcinolone Acetonide 0.5 % Crea (Triamcinolone acetonide) .... Apply bid to affected area 10)  Fluticasone Propionate 50 Mcg/act Susp (Fluticasone propionate) .... 2 sprays each nostril once daily 11)  Prednisone 10 Mg Tabs (Prednisone) .... Take 40mg  qd for 3 days, then 20 mg qd for 3 days, then 10mg  qd for 6 days, then stop. take pc. 12)  Sudafed 12 Hour 120 Mg Xr12h-tab (Pseudoephedrine hcl) .Marland Kitchen.. 1 by mouth two times a day as needed allergies 13)  Ceftin 250 Mg Tabs (Cefuroxime axetil) .... 2 by mouth bid 14)  Meclizine Hcl 12.5 Mg Tabs (Meclizine hcl) .Marland Kitchen.. 1-2 by mouth qid as needed dizziness  Patient Instructions: 1)  Call if you are not better in a reasonable amount of time or if worse.  Prescriptions: MECLIZINE HCL 12.5 MG TABS (MECLIZINE HCL) 1-2 by mouth qid as needed dizziness  #60 x 1   Entered and Authorized by:   Tresa Garter MD   Signed by:   Tresa Garter MD on 09/08/2010   Method used:   Electronically to        [facility]  [address] [id number] (retail)       [address]       [place], [place]  [address]       Ph: [phone] or [phone]       Fax: [phone]   RxID:   [id number] CEFTIN 250 MG TABS (CEFUROXIME AXETIL) 2 by mouth bid  #56 x 2   Entered and Authorized by:   Tresa Garter MD   Signed by:   Tresa Garter MD on 09/08/2010   Method used:   Electronically to        [facility]  [address] [id number] (retail)       [address]       [place], [place]  [address]       Ph: [phone] or [phone]       Fax: [phone]   RxID:   [id number] SUDAFED 12 HOUR 120 MG XR12H-TAB (PSEUDOEPHEDRINE HCL) 1 by mouth two times a day as needed allergies  #60 x 1   Entered and Authorized by:   Tresa Garter MD   Signed by:   Tresa Garter MD on 09/08/2010   Method used:   Electronically to         [facility]  [address] [id number]       [address]       [facility]  [place], [place]  [address]       Ph: [phone] or [phone]       Fax: [phone]   RxID:   [id number] PREDNISONE 10 MG TABS (PREDNISONE) Take 40mg  qd for 3 days, then 20 mg qd for 3 days, then 10mg  qd for 6 days, then stop. Take pc.  #24 x 2   Entered and Authorized by:   Tresa Garter MD   Signed by:   Tresa Garter MD on 09/08/2010   Method used:   [facility]ronically to        [facility]  [address] [id number] ([facility]       [address]       [place], [place]  [address]       Ph: [phone] or [phone]       Fax: [phone]   RxID:   [id number]

## 2011-01-09 NOTE — Consult Note (Signed)
Summary: [facility]  [facility]   Imported By: Sherian Rein 09/28/2010 11:36:08  _____________________________________________________________________  External Attachment:    Type:   Image     Comment:   External Document

## 2011-01-09 NOTE — Progress Notes (Signed)
Summary: Rtn call  Phone Note Call from Patient Call back at Home Phone [phone] Call back at [phone]   Summary of Call: Patient is returning MD call to discuss test results and referral. Patient will be having a mammogram from around 11-12 today, but would really like to discuss with MD. Initial call taken by: Lucious Groves,  January 25, 2010 8:44 AM  Follow-up for Phone Call        called will get a CT Follow-up by: Tresa Garter MD,  January 25, 2010 5:59 PM  New Problems: ABNORMAL CHEST XRAY (ICD-793.1)   New Problems: ABNORMAL CHEST XRAY (ICD-793.1)

## 2011-01-09 NOTE — Progress Notes (Signed)
Summary: CT TODAY  Phone Note From Other Clinic Call back at [facility] Phone [phone]   Caller: Cell [phone] Summary of Call: Pt is scheduled for CT today at 4pm. She is very anxious for results.  Initial call taken by: Lamar Sprinkles, CMA,  January 27, 2010 2:29 PM  Follow-up for Phone Call        Results ready in EMR. Please review, pt would like to know before the weekend.  Follow-up by: Lamar Sprinkles, CMA,  January 27, 2010 4:44 PM  Additional Follow-up for Phone Call Additional follow up Details #1::        I called and left a VM - good news - no pulm. fibrosis on CT Additional Follow-up by: Tresa Garter MD,  January 27, 2010 5:11 PM    Additional Follow-up for Phone Call Additional follow up Details #2::    Pt informed  Follow-up by: Lamar Sprinkles, CMA,  January 27, 2010 5:37 PM

## 2011-01-09 NOTE — Assessment & Plan Note (Signed)
Summary: CONGESTION / COUGH / AVP'S PT/NWS   Vital Signs:  Patient profile:   60 year old female Height:      64 inches Weight:      168 pounds BMI:     28.94 O2 Sat:      98 % on Room air Temp:     98.2 degrees F oral Pulse rate:   81 / minute BP sitting:   138 / 100  (left arm) Cuff size:   regular  Vitals Entered ByZella Ball Ewing (December 30, 2009 10:35 AM)  O2 Flow:  Room air CC: congestion,cough,sore throat/RE   CC:  congestion, cough, and sore throat/RE.  History of Present Illness: here with acute symptoms;  states BP at home has been < 140/90;  acute symptoms include left ear pain and fever mild to mod for 3 days without sinus pain, congestion or ST, but does have prod cough with greenish sputum.  ;  Pt denies CP, sob, doe, wheezing, orthopnea, pnd, worsening LE edema, palps, dizziness or syncope .  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 1 Once Daily Pc 2)  Alprazolam 0.25 Mg Tabs (Alprazolam) .Marland Kitchen.. 1 Two Times A Day Prn 3)  Vitamin D3 1000 Unit  Tabs (Cholecalciferol) .Marland Kitchen.. 1 Qd 4)  Etodolac Cr 500 Mg   Tb24 (Etodolac) .Marland Kitchen.. 1 By Mouth Once Daily Pc 5)  Lipitor 20 Mg Tabs (Atorvastatin Calcium) .Marland Kitchen.. 1po Qd 6)  Epipen 2-Pak 0.3 Mg/0.60ml (1:1000) Devi (Epinephrine Hcl (Anaphylaxis)) .... As Dirr 7)  Prednisone 10 Mg  Tabs (Prednisone) .... 4 Tabs On D#1 and 2 Tabs On D#2 As Needed Hives 8)  Anti-Hist Allergy 25 Mg Tabs (Diphenhydramine Hcl) .... 2 Tabs Q4 H As Needed Hives 9)  Triamcinolone Acetonide 0.5 % Crea (Triamcinolone Acetonide) .... Apply Bid To Affected Area 10)  Fluticasone Propionate 50 Mcg/act  Susp (Fluticasone Propionate) .... 2 Sprays Each Nostril Once Daily 11)  Prednisone 10 Mg  Tabs (Prednisone) .... Take 40mg  Qd For 3 Days, Then 20 Mg Qd For 3 Days, Then 10mg  Qd For 6 Days, Then Stop. Take Pc.  Current Medications (verified): 1)  Aspir-Low 81 Mg Tbec (Aspirin) .Marland Kitchen.. 1 Once Daily Pc 2)  Alprazolam 0.25 Mg Tabs (Alprazolam) .Marland Kitchen.. 1 Two Times A Day Prn 3)  Vitamin D3 1000 Unit  Tabs (Cholecalciferol) .Marland Kitchen.. 1 Qd 4)  Etodolac Cr 500 Mg  Tb24 (Etodolac) .Marland Kitchen.. 1 By Mouth Once Daily Pc 5)  Lipitor 20 Mg Tabs (Atorvastatin Calcium) .Marland Kitchen.. 1po Qd 6)  Epipen 2-Pak 0.3 Mg/0.70ml (1:1000) Devi (Epinephrine Hcl (Anaphylaxis)) .... As Dirr 7)  Hydrocodone-Homatropine 5-1.5 Mg/45ml Syrp (Hydrocodone-Homatropine) .Marland KitchenMarland KitchenMarland Kitchen  1 Tsp By Mouth Q 6 Hrs As Needed Cough 8)  Anti-Hist Allergy 25 Mg Tabs (Diphenhydramine Hcl) .... 2 Tabs Q4 H As Needed Hives 9)  Triamcinolone Acetonide 0.5 % Crea (Triamcinolone Acetonide) .... Apply Bid To Affected Area 10)  Fluticasone Propionate 50 Mcg/act  Susp (Fluticasone Propionate) .... 2 Sprays Each Nostril Once Daily 11)  Ceftin 250 Mg Tabs (Cefuroxime Axetil) .Marland Kitchen.. 1 By Mouth Two Times A Day  Allergies (verified): 1)  ! Sulfa 2)  ! Biaxin 3)  ! Food Color Red (Dye Fdc Red 40 (Allura Red)) 4)  ! 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  , mild ill  Head:  normocephalic and atraumatic.   Eyes:  vision grossly intact, pupils equal, and pupils round.   Ears:  keft tm with severe erythema and mild bulging;  right TM ok;  sinus nontender Nose:  nasal dischargemucosal pallor and mucosal erythema.   Mouth:  pharyngeal erythema and fair dentition.   Neck:  supple and cervical lymphadenopathy.   Lungs:  normal respiratory effort and normal breath sounds.   Heart:  normal rate and regular rhythm.   Extremities:  no edema, no erythema    Impression & Recommendations:  Problem # 1:  OTITIS MEDIA, ACUTE, LEFT (ICD-382.9)  Her updated medication list for this problem includes:    Aspir-low 81 Mg Tbec (Aspirin) .Marland Kitchen... 1 once daily pc    Etodolac Cr 500 Mg Tb24 (Etodolac) .Marland Kitchen... 1 by mouth once daily pc    Ceftin 250 Mg Tabs (Cefuroxime axetil) .Marland Kitchen... 1 by mouth two times a day treat as above, f/u any worsening signs or symptoms   Problem # 2:  BRONCHITIS-ACUTE (ICD-466.0)  Her updated medication list for this problem includes:    Hydrocodone-homatropine 5-1.5 Mg/75ml Syrp (Hydrocodone-homatropine) .Marland Kitchen... 1 tsp by mouth q 6 hrs as needed cough    Ceftin 250 Mg Tabs (Cefuroxime axetil) .Marland Kitchen... 1 by mouth two times a day treat as above, f/u any worsening signs or symptoms   Problem # 3:  ELEVATED BLOOD PRESSURE WITHOUT DIAGNOSIS OF HYPERTENSION (ICD-796.2)  BP today: 138/100 Prior BP: 144/80  (10/05/2009)  Labs Reviewed: Creat: 0.7 (12/20/2008) Chol: 172 (12/20/2008)   HDL: 61.6 (12/20/2008)   LDL: 97 (12/20/2008)   TG: 69 (12/20/2008)  Instructed in low sodium diet (DASH Handout) and behavior modification.  mild elev today, likely situational, ok to follow, continue same treatment .  Declines meds at this time.    Complete Medication List: 1)  Aspir-low 81 Mg Tbec (Aspirin) .Marland Kitchen.. 1 once daily pc 2)  Alprazolam 0.25 Mg Tabs (Alprazolam) .Marland Kitchen.. 1 two times a day prn 3)  Vitamin D3 1000 Unit Tabs (Cholecalciferol) .Marland Kitchen.. 1 qd 4)  Etodolac Cr 500 Mg Tb24 (Etodolac) .Marland Kitchen.. 1 by mouth once daily pc 5)  Lipitor 20 Mg Tabs (Atorvastatin calcium) .Marland Kitchen.. 1po qd 6)  Epipen 2-pak 0.3 Mg/0.55ml (1:1000) Devi (Epinephrine hcl (anaphylaxis)) .Marland KitchenMarland KitchenMarland Kitchen  As dirr 7)  Hydrocodone-homatropine 5-1.5 Mg/33ml Syrp (Hydrocodone-homatropine) .Marland Kitchen.. 1 tsp by mouth q 6 hrs as needed cough 8)  Anti-hist Allergy 25 Mg Tabs (Diphenhydramine hcl) .... 2 tabs q4 h as needed hives 9)  Triamcinolone Acetonide 0.5 % Crea (Triamcinolone acetonide) .... Apply bid to affected area 10)  Fluticasone Propionate 50 Mcg/act Susp (Fluticasone propionate) .... 2 sprays each nostril once daily 11)  Ceftin 250 Mg Tabs (Cefuroxime axetil) .Marland Kitchen.. 1 by mouth two times a day  Patient Instructions: 1)  Please take all new medications as prescribed 2)  Continue all previous medications as before this visit  3)  Please schedule an appointment with your primary doctor as needed 4)  Check your Blood Pressure regularly. If it is above 140/90: you should make an appointment as soon as able. Prescriptions: HYDROCODONE-HOMATROPINE 5-1.5 MG/5ML SYRP (HYDROCODONE-HOMATROPINE) 1 tsp by mouth q 6 hrs as needed cough  #6 oz x 1   Entered and Authorized by:   Corwin Levins MD   Signed by:   Corwin Levins MD on 12/30/2009   Method used:   Print then Give to Patient   RxID:   [id number] CEFTIN 250 MG TABS (CEFUROXIME AXETIL) 1 by mouth two times a day   #20 x 0   Entered and Authorized by:   Corwin Levins MD   Signed by:   Corwin Levins MD on 12/30/2009   Method used:   Print then Give to Patient   RxID:   [id number]

## 2011-01-09 NOTE — Miscellaneous (Signed)
Summary: BONE DENSITY  Clinical Lists Changes  Orders: Added new Test order of T-Bone Densitometry (77080) - Signed Added new Test order of T-Lumbar Vertebral Assessment (77082) - Signed 

## 2011-01-09 NOTE — Assessment & Plan Note (Signed)
Summary: EARACHE/CD   Vital Signs:  Patient profile:   60 year old female Weight:      170 pounds Temp:     98.2 degrees F oral Pulse rate:   70 / minute BP sitting:   142 / 90  (left arm)  Vitals Entered By: Tora Perches (January 23, 2010 11:22 AM) CC: earache Is Patient Diabetic? No   CC:  earache.  History of Present Illness: C/o cough x almost a year following a bad case of URI, mild cough, nonproductive C/o bad L earache x 2 d F/u foot pain - better  Preventive Screening-Counseling & Management  Alcohol-Tobacco     Smoking Status: never  Allergies: 1)  ! Sulfa 2)  ! Biaxin 3)  ! Food Color Red (Dye Fdc Red 40 (Allura Red)) 4)  ! 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  The patient denies fever, chest pain, syncope, and dyspnea on exertion.    Physical Exam  General:  alert and well-developed.    Ears:  External ear exam shows no significant lesions or deformities.  Otoscopic examination reveals clear canals, tympanic membranes are intact bilaterally with mild bulging, no  retraction,no  inflammation or discharge. Hearing is grossly normal bilaterally. Nose:  External nasal examination shows no deformity or inflammation. Nasal mucosa are pink and moist without lesions or exudates. Mouth:  Oral mucosa and oropharynx without lesions or exudates.  Teeth in good repair. Lungs:  Normal respiratory effort, chest expands symmetrically. Lungs are clear to auscultation, no crackles or wheezes. Heart:  Normal rate and regular rhythm. S1 and S2 normal without gallop, murmur, click, rub or other extra  sounds.   Impression & Recommendations:  Problem # 1:  OTITIS MEDIA (ICD-382.9) L Assessment Deteriorated Take 40mg  qd for 3 days, then 20 mg qd for 3 days, then 10mg  qd for 6 days, then stop. Take pc.     Ceftin 250 Mg Tabs (Cefuroxime axetil) .Marland Kitchen... 2 by mouth two times a day if worse  Orders: ENT Referral (ENT)  Problem # 2:  ABNORMAL CHEST XRAY (ICD-793.1) poss pulm fibrosis per Radiol. - new since last CXR 1 y ago Assessment: New Will get a chest CT  Problem # 3:  COUGH (ICD-786.2) Assessment: Unchanged As per #2 Orders: T-2 View CXR, Same Day (71020.5TC)  Problem # 4:  FOOT PAIN (ICD-729.5) Assessment: Improved The Xrays were reviewed with the patient.   Complete Medication List: 1)  Aspir-low 81 Mg Tbec (Aspirin) .Marland Kitchen.. 1 once daily pc 2)  Alprazolam 0.25 Mg Tabs (Alprazolam) .Marland Kitchen.. 1 two times a day prn 3)  Vitamin D3 1000 Unit Tabs (Cholecalciferol) .Marland Kitchen.. 1 qd 4)  Etodolac Cr 500 Mg Tb24 (Etodolac) .Marland Kitchen.. 1 by mouth once daily pc 5)  Lipitor 20 Mg Tabs (Atorvastatin calcium) .Marland Kitchen.. 1po qd 6)  Epipen 2-pak 0.3 Mg/0.100ml (1:1000) Devi (Epinephrine hcl (anaphylaxis)) .... As dirr 7)  Hydrocodone-homatropine 5-1.5 Mg/71ml Syrp (Hydrocodone-homatropine) .Marland Kitchen.. 1 tsp by mouth q 6 hrs as needed cough 8)  Anti-hist Allergy 25 Mg Tabs (Diphenhydramine hcl) .... 2 tabs q4 h as needed hives 9)  Triamcinolone Acetonide 0.5 % Crea (Triamcinolone acetonide) .... Apply bid to affected area  10)  Fluticasone Propionate 50 Mcg/act Susp (Fluticasone propionate) .... 2 sprays each nostril once daily 11)  Ceftin 250 Mg Tabs (Cefuroxime axetil) .... 2 by mouth two times a day 12)  Prednisone 10 Mg Tabs (Prednisone) .... Take 40mg  qd for 3 days, then 20 mg qd for 3 days, then 10mg  qd for 6 days, then stop. take pc.  Patient Instructions: 1)  Call if you are not better in a reasonable amount of time or if worse.  Prescriptions: CEFTIN 250 MG TABS (CEFUROXIME AXETIL) 2 by mouth two times a day  #40 x  0   Entered and Authorized by:   Tresa Garter MD   Signed by:   Tresa Garter MD on 01/23/2010   Method used:   Print then Give to Patient   RxID:   [id number] PREDNISONE 10 MG TABS (PREDNISONE) Take 40mg  qd for 3 days, then 20 mg qd for 3 days, then 10mg  qd for 6 days, then stop. Take pc.  #24 x 1   Entered and Authorized by:   Tresa Garter MD   Signed by:   Tresa Garter MD on 01/23/2010   Method used:   Print then Give to Patient   RxID:   [id number]

## 2011-01-09 NOTE — Letter (Signed)
Summary: [facility]   Imported By: Sherian Rein 08/09/2010 10:04:26  _____________________________________________________________________  External Attachment:    Type:   Image     Comment:   External Document

## 2011-01-09 NOTE — Assessment & Plan Note (Signed)
Summary: CPX/[facility]/#/CD   Vital Signs:  Patient profile:   60 year old female Weight:      167 pounds Temp:     98 degrees F oral Pulse rate:   69 / minute BP sitting:   140 / 92  (left arm)  Vitals Entered By: Tora Perches (January 06, 2010 10:35 AM) CC: cpx Is Patient Diabetic? No   CC:  cpx.  History of Present Illness: The patient presents for a wellness examination   Preventive Screening-Counseling & Management  Alcohol-Tobacco     Smoking Status: never  Current Medications (verified): 1)  Aspir-Low 81 Mg Tbec (Aspirin) .Marland Kitchen.. 1 Once Daily Pc 2)  Alprazolam 0.25 Mg Tabs (Alprazolam) .Marland Kitchen.. 1 Two Times A Day Prn 3)  Vitamin D3 1000 Unit  Tabs (Cholecalciferol) .Marland Kitchen.. 1 Qd 4)  Etodolac Cr 500 Mg  Tb24 (Etodolac) .Marland Kitchen.. 1 By Mouth Once Daily Pc 5)  Lipitor 20 Mg Tabs (Atorvastatin Calcium) .Marland Kitchen.. 1po Qd 6)  Epipen 2-Pak 0.3 Mg/0.17ml (1:1000) Devi (Epinephrine Hcl (Anaphylaxis)) .... As Dirr 7)  Hydrocodone-Homatropine 5-1.5 Mg/39ml Syrp (Hydrocodone-Homatropine) .Marland Kitchen.. 1 Tsp By Mouth Q 6 Hrs As Needed Cough 8)  Anti-Hist Allergy 25 Mg Tabs (Diphenhydramine Hcl) .... 2 Tabs Q4 H As Needed Hives 9)  Triamcinolone Acetonide 0.5 % Crea (Triamcinolone Acetonide) .... Apply Bid To Affected Area 10)  Fluticasone Propionate 50 Mcg/act  Susp (Fluticasone Propionate) .... 2 Sprays Each Nostril Once Daily 11)  Ceftin 250 Mg Tabs (Cefuroxime Axetil) .Marland Kitchen.. 1 By Mouth Two Times A Day  Allergies: 1)  ! Sulfa 2)  ! Biaxin 3)  ! Food Color Red (Dye Fdc Red 40 (Allura Red)) 4)  ! 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  The patient denies anorexia, fever, weight loss, vision loss, decreased hearing, hoarseness, chest pain, syncope, dyspnea on exertion, peripheral edema, prolonged cough, headaches, hemoptysis, abdominal pain, melena, hematochezia, severe indigestion/heartburn, hematuria, incontinence, genital sores, muscle weakness, suspicious skin lesions, transient blindness, difficulty walking, depression, unusual weight change, abnormal bleeding, enlarged lymph nodes, angioedema, and breast masses.    Physical Exam  General:  alert and well-developed.    Head:  Normocephalic and atraumatic without obvious abnormalities. No apparent alopecia or balding. Eyes:  No corneal or conjunctival inflammation noted. EOMI. Perrla. Funduscopic exam benign, without hemorrhages, exudates or papilledema. Vision grossly normal. Ears:  External ear exam shows no significant lesions or deformities.  Otoscopic examination reveals clear canals, tympanic membranes are intact bilaterally without bulging, retraction, inflammation or discharge. Hearing is grossly normal bilaterally. Nose:  External nasal examination shows no deformity or inflammation. Nasal mucosa are pink and moist without lesions or exudates. Mouth:  Oral mucosa and oropharynx without lesions or exudates.  Teeth in good repair. Neck:  supple and cervical lymphadenopathy.   Lungs:  Normal respiratory effort, chest expands symmetrically. Lungs are clear to auscultation, no crackles or wheezes. Heart:  Normal rate and regular rhythm. S1 and S2 normal without gallop, murmur, click, rub or other extra sounds.  Abdomen:  Bowel sounds positive,abdomen soft and non-tender without masses, organomegaly or hernias noted. Msk:  R lat foot swollen and tender L AC prominent,  NT Pulses:  R and L carotid,radial,femoral,dorsalis pedis and posterior tibial pulses are full and equal bilaterally Extremities:  No clubbing, cyanosis, edema, or deformity noted with normal full range of motion of all joints.   Neurologic:  No cranial nerve deficits noted. Station and gait are normal. Plantar reflexes are down-going bilaterally. DTRs are symmetrical throughout. Sensory, motor and coordinative functions appear intact. Skin:  Intact without suspicious lesions or rashes Cervical Nodes:  No lymphadenopathy noted Inguinal Nodes:  No significant adenopathy Psych:  Cognition and judgment appear intact. Alert and cooperative with normal attention span and concentration. No apparent delusions, illusions, hallucinations   Impression & Recommendations:  Problem # 1:  PHYSICAL EXAMINATION (ICD-V70.0) Assessment New BP nl at home Orders: EKG w/ Interpretation (93000) nl Health and age related issues were discussed. Available screening tests and vaccinations were discussed as well. Healthy life style including good diet and execise was discussed. The labs were reviewed with the patient.   Problem # 2:  ANXIETY (ICD-300.00) Assessment: Unchanged  Her updated medication list for this problem includes:    Alprazolam 0.25 Mg Tabs (Alprazolam) .Marland Kitchen... 1 two times a day prn  Problem # 3:  HYPERLIPIDEMIA (ICD-272.4) Assessment: Unchanged  Her updated medication list for this problem includes:    Lipitor 20 Mg Tabs (Atorvastatin calcium) .Marland Kitchen... 1po qd  Problem # 4:  URTICARIA (ICD-708.9) Assessment: Improved  Problem # 5:  SHOULDER PAIN (ICD-719.41) Assessment: New  Her updated medication list for this problem includes:    Aspir-low 81 Mg Tbec (Aspirin) .Marland Kitchen... 1 once daily pc    Etodolac Cr 500 Mg Tb24 (Etodolac) .Marland Kitchen... 1 by mouth once daily pc  Orders: T-Shoulder Left Min 2 Views (73030TC)  Problem # 6:  FOOT PAIN (ICD-729.5) r/o fx  Assessment: New  Orders: T-Foot Right  (73630TC)  Complete Medication List: 1)  Aspir-low 81 Mg Tbec (Aspirin) .Marland Kitchen.. 1 once daily pc 2)  Alprazolam 0.25 Mg Tabs (Alprazolam) .Marland Kitchen.. 1 two times a day prn 3)  Vitamin D3 1000 Unit Tabs (Cholecalciferol) .Marland Kitchen.. 1 qd 4)  Etodolac Cr 500 Mg Tb24 (Etodolac) .Marland Kitchen.. 1 by mouth once daily pc 5)  Lipitor 20 Mg Tabs (Atorvastatin calcium) .Marland Kitchen.. 1po qd 6)  Epipen 2-pak 0.3 Mg/0.73ml (1:1000) Devi (Epinephrine hcl (anaphylaxis)) .... As dirr 7)  Hydrocodone-homatropine 5-1.5 Mg/15ml Syrp (Hydrocodone-homatropine) .Marland Kitchen.. 1 tsp by mouth q 6 hrs as needed cough 8)  Anti-hist Allergy 25 Mg Tabs (Diphenhydramine hcl) .... 2 tabs q4 h as needed hives 9)  Triamcinolone Acetonide 0.5 % Crea (Triamcinolone acetonide) .... Apply bid to affected area 10)  Fluticasone Propionate 50 Mcg/act Susp (Fluticasone propionate) .... 2 sprays each nostril once daily 11)  Ceftin 250 Mg Tabs (Cefuroxime axetil) .Marland Kitchen.. 1 by mouth two times a day  Other Orders: Zoster (Shingles) Vaccine Live [phone]) Admin 1st Vaccine (19147)  Patient Instructions: 1)  Please schedule a follow-up appointment in 1 year well w/labs. Prescriptions: ETODOLAC CR 500 MG  TB24 (ETODOLAC) 1 by mouth once daily pc  #90 x 3   Entered and Authorized by:   Tresa Garter MD   Signed by:   Tresa Garter MD on 01/06/2010   Method used:   Print then Give to Patient   RxID:   [id number] ALPRAZOLAM 0.25 MG TABS (ALPRAZOLAM) 1 two times a day prn  #60  x 3   Entered and Authorized by:   Tresa Garter MD   Signed by:   Tresa Garter MD on 01/06/2010   Method used:   Print then Give to Patient   RxID:   [id number] LIPITOR 20 MG TABS (ATORVASTATIN CALCIUM) 1po qd  #90 x 3   Entered and Authorized by:   Tresa Garter MD   Signed by:   Tresa Garter MD on 01/06/2010   Method used:   Print then Give to Patient   RxID:   [id number]    Immunizations Administered:  Zostavax # 1:    Vaccine Type: Zostavax     Site: left deltoid    Mfr: Merck    Dose: 0.5 ml    Route:     Given by: Tora Perches    Exp. Date: 01/06/2011    Lot #: [id number]    VIS given: 09/21/05 given January 06, 2010.

## 2011-01-10 ENCOUNTER — Encounter (INDEPENDENT_AMBULATORY_CARE_PROVIDER_SITE_OTHER): Payer: 59

## 2011-01-10 DIAGNOSIS — R0989 Other specified symptoms and signs involving the circulatory and respiratory systems: Secondary | ICD-10-CM

## 2011-01-12 ENCOUNTER — Encounter: Payer: Self-pay | Admitting: Internal Medicine

## 2011-01-17 NOTE — Assessment & Plan Note (Signed)
Summary: ]cpx/[facility]/#?cd   Vital Signs:  Patient profile:   60 year old female Height:      64 inches Weight:      172 pounds BMI:     29.63 Temp:     97.9 degrees F oral Pulse rate:   80 / minute Pulse rhythm:   regular Resp:     16 per minute BP sitting:   150 / 98  (left arm) Cuff size:   regular  Vitals Entered By: Lanier Prude, CMA(AAMA) (January 08, 2011 3:47 PM) CC: CPX Is Patient Diabetic? No   CC:  CPX.  History of Present Illness: The patient presents for a preventive health examination. C/o foot injury  Current Medications (verified): 1)  Aspir-Low 81 Mg Tbec (Aspirin) .Marland Kitchen.. 1 Once Daily Pc 2)  Alprazolam 0.25 Mg Tabs (Alprazolam) .Marland Kitchen.. 1 Two Times A Day Prn 3)  Vitamin D3 1000 Unit  Tabs (Cholecalciferol) .Marland Kitchen.. 1 Qd 4)  Etodolac Cr 500 Mg  Tb24 (Etodolac) .Marland Kitchen.. 1 By Mouth Once Daily Pc 5)  Lipitor 20 Mg Tabs (Atorvastatin Calcium) .Marland Kitchen.. 1po Qd 6)  Epipen 2-Pak 0.3 Mg/0.27ml (1:1000) Devi (Epinephrine Hcl (Anaphylaxis)) .... As Dirr 7)  Hydrocodone-Homatropine 5-1.5 Mg/28ml Syrp (Hydrocodone-Homatropine) .Marland Kitchen.. 1 Tsp By Mouth Q 6 Hrs As Needed Cough 8)  Anti-Hist Allergy 25 Mg Tabs (Diphenhydramine Hcl) .... 2 Tabs Q4 H As Needed Hives 9)  Triamcinolone Acetonide 0.5 % Crea (Triamcinolone Acetonide) .... Apply Bid To Affected Area 10)  Fluticasone Propionate 50 Mcg/act  Susp (Fluticasone Propionate) .... 2 Sprays Each Nostril Once Daily 11)  Prednisone 10 Mg Tabs (Prednisone) .... Take 40mg  Qd For 3 Days, Then 20 Mg Qd For 3 Days, Then 10mg  Qd For 6 Days, Then Stop. Take Pc. 12)  Sudafed 12 Hour 120 Mg Xr12h-Tab (Pseudoephedrine Hcl) .Marland Kitchen.. 1 By Mouth Two Times A Day As Needed Allergies 13)  Meclizine Hcl 12.5 Mg Tabs (Meclizine Hcl) .Marland Kitchen.. 1-2 By Mouth Qid As Needed Dizziness  Allergies (verified): 1)  ! Sulfa 2)  ! Biaxin 3)  ! Food Color Red (Dye Fdc Red 40 (Allura Red)) 4)  ! 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-yes  Past Surgical History: Cholecystectomy  89 Hysterectomy98 partial Tonsillectomy ACL L knee B ear tubes 10/11  Review of Systems  The patient denies anorexia, fever, weight loss, weight gain, vision loss, decreased hearing, hoarseness, chest pain, syncope, dyspnea on exertion, peripheral edema, prolonged cough, headaches, hemoptysis, abdominal pain, melena, hematochezia, severe indigestion/heartburn, hematuria, incontinence, genital sores, muscle weakness, suspicious skin lesions, transient blindness, difficulty walking, depression, unusual weight change, abnormal bleeding, enlarged lymph nodes, angioedema, and breast masses.         BP nl at home  Physical Exam  General:  alert and well-developed.    Head:  Normocephalic and atraumatic without obvious abnormalities. No apparent alopecia or balding. NT. Eyes:  No corneal or conjunctival inflammation noted. EOMI. Perrla.  Ears:  tubes in B TMs Nose:  External nasal examination shows no deformity or inflammation. Nasal mucosa are pink and moist without lesions or exudates. Mouth:  WNL Neck:  No deformities, masses, or tenderness noted. Lungs:  Normal respiratory effort, chest  expands symmetrically. Lungs are clear to auscultation, no crackles or wheezes. Heart:  Normal rate and regular rhythm. S1 and S2 normal without gallop, murmur, click, rub or other extra sounds. Abdomen:  Bowel sounds positive,abdomen soft and non-tender without masses, organomegaly or hernias noted. Msk:  R lat foot swollen and tender L AC prominent, NT Pulses:  R and L  carotid,radial,femoral,dorsalis pedis and posterior tibial pulses are full and equal bilaterally Extremities:  No clubbing, cyanosis, edema, or deformity noted with normal full range of motion of all joints.   Neurologic:  No cranial nerve deficits noted. Station and gait are normal. Plantar reflexes are down-going bilaterally. DTRs are symmetrical throughout. Sensory, motor and coordinative functions appear intact. Skin:  Intact without suspicious lesions or rashes Cervical Nodes:  No lymphadenopathy noted Inguinal Nodes:  No significant adenopathy Psych:  Cognition and judgment appear intact. Alert and cooperative with normal attention span and concentration. No apparent delusions, illusions, hallucinations   Impression & Recommendations:  Problem # 1:  PHYSICAL EXAMINATION (ICD-V70.0) Assessment New Health and age related issues were discussed. Available screening tests and vaccinations were discussed as well. Healthy life style including good diet and exercise was discussed.  The labs were reviewed with the patient.  GYN q 12 months  Colon when recalled by Dr Kinnie Scales Orders: EKG w/ Interpretation (93000)OK  Problem # 2:  FOOT PAIN (ICD-729.5)L  contusion Assessment: New Xray if not better  Problem # 3:  CAROTID BRUIT (ICD-785.9)R Assessment: New  Orders: Doppler Referral (Doppler)  Complete Medication List: 1)  Aspir-low 81 Mg Tbec (Aspirin) .Marland Kitchen.. 1 once daily pc 2)  Alprazolam 0.25 Mg Tabs (Alprazolam) .Marland Kitchen.. 1 two times a day prn 3)  Vitamin D3 1000 Unit Tabs (Cholecalciferol) .Marland Kitchen.. 1 qd 4)  Etodolac Cr 500 Mg Tb24 (Etodolac) .Marland Kitchen.. 1 by mouth once daily pc 5)  Lipitor 20 Mg Tabs (Atorvastatin calcium) .Marland Kitchen.. 1po qd 6)  Epipen 2-pak 0.3 Mg/0.83ml (1:1000) Devi (Epinephrine hcl (anaphylaxis)) .... As dirr 7)  Hydrocodone-homatropine 5-1.5 Mg/32ml Syrp (Hydrocodone-homatropine) .Marland Kitchen.. 1 tsp by mouth q 6 hrs as needed cough 8)  Anti-hist Allergy 25 Mg Tabs (Diphenhydramine hcl) .... 2  tabs q4 h as needed hives 9)  Triamcinolone Acetonide 0.5 % Crea (Triamcinolone acetonide) .... Apply bid to affected area 10)  Fluticasone Propionate 50 Mcg/act Susp (Fluticasone propionate) .... 2 sprays each nostril once daily 11)  Prednisone 10 Mg Tabs (Prednisone) .... Take 40mg  qd for 3 days, then 20 mg qd for 3 days, then 10mg  qd for 6 days, then stop. take pc. 12)  Sudafed 12 Hour 120 Mg Xr12h-tab (Pseudoephedrine hcl) .Marland Kitchen.. 1 by mouth two times a day as needed allergies 13)  Meclizine Hcl 12.5 Mg Tabs (Meclizine hcl) .Marland Kitchen.. 1-2 by mouth qid as needed dizziness  Patient Instructions: 1)  Please schedule a follow-up appointment in 6 months. 2)  BMP prior to visit, ICD-9: 3)  Hepatic Panel prior to visit, ICD-9:272.0 4)  Lipid Panel prior to visit, ICD-9: Prescriptions: LIPITOR 20 MG TABS (ATORVASTATIN CALCIUM) 1po qd  #90 x 3   Entered and Authorized by:   Tresa Garter MD   Signed by:   Tresa Garter MD on 01/08/2011   Method used:   Print then Give to Patient   RxID:   [id number] ETODOLAC CR 500 MG  TB24 (ETODOLAC) 1 by mouth once daily pc  #90 x 3   Entered and Authorized by:   Tresa Garter MD   Signed by:  Georgina Quint Plotnikov MD on 01/08/2011   Method used:   Print then Give to Patient   RxID:   [id number] ALPRAZOLAM 0.25 MG TABS (ALPRAZOLAM) 1 two times a day prn  #60 x 3   Entered and Authorized by:   Tresa Garter MD   Signed by:   Tresa Garter MD on 01/08/2011   Method used:   Print then Give to Patient   RxID:   [id number]    Orders Added: 1)  EKG w/ Interpretation [93000] 2)  Doppler Referral [Doppler] 3)  Est. Patient 40-64 years [99396]   Immunization History:  Influenza Immunization History:    Influenza:  historical (09/21/2010)   Immunization History:  Influenza Immunization History:    Influenza:  Historical (09/21/2010)

## 2011-01-17 NOTE — Miscellaneous (Signed)
Summary: Orders Update  Clinical Lists Changes  Problems: Added new problem of CAROTID BRUIT (ICD-785.9) Orders: Added new Test order of Carotid Duplex (Carotid Duplex) - Signed 

## 2011-01-31 ENCOUNTER — Encounter: Payer: Self-pay | Admitting: Internal Medicine

## 2011-02-14 ENCOUNTER — Encounter: Payer: Self-pay | Admitting: Internal Medicine

## 2011-02-20 NOTE — Miscellaneous (Signed)
Summary: mammogram 2012  Clinical Lists Changes  Observations: Added new observation of MAMMOGRAM: normal (01/31/2011 9:17)      Preventive Care Screening  Mammogram:    Date:  01/31/2011    Results:  normal

## 2011-04-27 NOTE — Op Note (Signed)
[place]  Patient:    Anna Ellis, Anna Ellis                      MRN: [id number] Proc. Date: 05/07/00 Adm. Date:  [id number] Attending:  Sandi Raveling                           Operative Report  PREOPERATIVE DIAGNOSIS: 1. Left knee anterior cruciate ligament tear. 2. Left knee medial and lateral meniscal tears.  POSTOPERATIVE DIAGNOSIS: 1. Left knee anterior cruciate ligament tear. 2. Left knee medial and lateral meniscal tears. 3. 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  OPERATION PERFORMED:  Left knee examination under anesthesia followed by arthroscopically assisted endoscopic bone-patellar tendon-bone allograft, anterior cruciate ligament reconstruction using 9 x 25 mm femoral interference Bioscrew and 9 x 25 mm tibial interference Bioscrew.  Left knee partial medial and lateral meniscectomies.  Left knee lateral compartment chondroplasty.  SURGEON:  Elana Alm. Thurston Hole, M.D.  ASSISTANT:  Kirstin Adelberger, P.A.  ANESTHESIA:  General.  OPERATIVE TIME:  One and a half hours.  COMPLICATIONS:  None.  INDICATIONS FOR PROCEDURE:  Anna Ellis is a 60 year old woman who has had significant left knee pain secondary to a twisting injury approximately eight weeks ago.  Has a history of an ACL deficient knee but has done well until this recent pivot shift twisting episode and now has significant pain and instability and swelling of the knee.  She is now to undergo arthroscopy after an MRI documented medial and lateral meniscal tears and an ACL deficiency and also ACL reconstruction due to her significant instability.  DESCRIPTION OF PROCEDURE:  Anna Ellis was brought to the operating room on May 07, 2000, placed on the operating table in a supine position.  After an adequate level of general anesthesia was obtained, her left knee was examined under anesthesia.  She had range of motion from 0 to 130 degrees, mild valgus deformity,  3+ Lachman, positive pivot shift, knee stable to varus-valgus and posterior stress.  After this was done, the knee was sterilely injected with 0.25% Marcaine with epinephrine.  The left leg was prepped using sterile Betadine and draped using sterile technique.  She received 1 gm IV Ancef preoperatively for prophylaxis.  Initially, through an inferolateral portal, the arthroscope with a pump attached was placed and through an inferomedial portal an arthroscopic probe was placed.  On initial inspection of the medial compartment, articular cartilage, medial femoral condyle, medial tibial plateau showed 30 to 40% grade 1 and 2 chondromalacia which was debrided, medial meniscus tear posterior horn in the white on white nonrepairable zone and thus the posterior 50% was resected back to a stable rim.  The medial and anterior horns were intact.  Intercondylar notch inspected.  The anterior cruciate ligament was absent.  There was significant anterior laxity.  The posterior cruciate ligament was intact and stable.  There were osteophytes in the tibial spine area and these were resected with a 6 mm bur.  Lateral compartment inspected.  She had a discrete 30% grade 4 chondral defect on the lateral femoral condyle which was debrided.  She had grade 2 changes over the lateral tibial plateau and 20 to 30% more grade 3 changes laterally on the femoral condyle which were debrided as well.  The lateral meniscus showed a split tear of the posterolateral corner of which 20 to 30% was resected back to a stable  rim.  The patellofemoral joint showed mild grade 1 and 2 chondromalacia.  The patella tracked normally.  Moderate synovitis in the medial and lateral gutters was debrided.  Otherwise they were free of pathology.  The patellar tendon graft allograft was prepared with 10 x 28 mm of patellar bone and tibial tubercle bone and then a 12 mm strip of patellar tendon was tubed and sewn with 2-0 Vicryl suture.   The tibial drill guide was then placed in the anteromedial aspect of the anteromedial portal and a Steinmann pin drilled up into the ACL insertion point on the tibial plateau through a 1.5 cm anteromedial proximal tibial incision and then overdrilled with a 10 mm drill.  Through this hole a posterior femoral guide was placed after a notch plasty had been performed and a Steinmann pin drilled up in the ACL origin point in the posterior femoral notch and then overdrilled with a 109 mm drill to a depth of 30 mm.  Through the tibial hole, a double pin passer was passed up through the tibial hole joint, femoral hole and through the femoral cortex and thigh through a stab wound.  This was used to pass the ACL allograft through the joint into the femoral tunnel.  It was locked into position there with a 9 x 25 mm interference Bioscrew through a separate anteromedial low portal.  After this was done, the knee was brought through a full range of motion.  There was found to be no impingement of the graft.  The tibial bone plug was then locked into its tunnel with a 9 x 25 mm interference Bioscrew with the knee in 10 degrees of flexion and the tibial held reduced on the femur.  After this was done there was found to be firm fixation of both bone plugs.  The Lachman and pivot shift were found to be eliminated and the knee could be brought through a full range of motion with no impingement of the graft.  At this point it is felt that all pathology had been satisfactorily addressed.  The wounds were closed with 2-0 Vicryl and 3-0 Prolene.  Steri-Strips were applied.  Sterile dressings were applied and a long leg splint.  The patient had a femoral nerve block placed by Dr. Krista Blue of anesthesia.  She was then awakened and taken to the recovery room in stable condition.  FOLLOW-UP:  Anna Ellis will be followed overnight [facility] for IV pain control and neurovascular monitoring and CPM  use.  Discharged tomorrow on Percocet and Naprosyn with a home CPM and ice machine.  See her back in the office in a week for suture removal and follow-up. DD:  05/07/00 TD:  05/09/00 Job: [id number]

## 2011-05-04 ENCOUNTER — Encounter (INDEPENDENT_AMBULATORY_CARE_PROVIDER_SITE_OTHER): Payer: Self-pay | Admitting: General Surgery

## 2011-06-15 ENCOUNTER — Ambulatory Visit (INDEPENDENT_AMBULATORY_CARE_PROVIDER_SITE_OTHER): Payer: 59 | Admitting: Internal Medicine

## 2011-06-15 ENCOUNTER — Encounter: Payer: Self-pay | Admitting: Internal Medicine

## 2011-06-15 VITALS — BP 130/88 | HR 80 | Temp 97.8°F | Resp 16 | Ht 64.0 in | Wt 167.0 lb

## 2011-06-15 DIAGNOSIS — J45909 Unspecified asthma, uncomplicated: Secondary | ICD-10-CM | POA: Insufficient documentation

## 2011-06-15 DIAGNOSIS — J069 Acute upper respiratory infection, unspecified: Secondary | ICD-10-CM

## 2011-06-15 DIAGNOSIS — L509 Urticaria, unspecified: Secondary | ICD-10-CM

## 2011-06-15 DIAGNOSIS — J309 Allergic rhinitis, unspecified: Secondary | ICD-10-CM

## 2011-06-15 DIAGNOSIS — R05 Cough: Secondary | ICD-10-CM

## 2011-06-15 MED ORDER — PREDNISONE 10 MG PO TABS: ORAL_TABLET | ORAL | Status: DC

## 2011-06-15 MED ORDER — METHYLPREDNISOLONE ACETATE 80 MG/ML IJ SUSP
120.0000 mg | Freq: Once | INTRAMUSCULAR | Status: AC
  Administered 2011-06-15: 120 mg via INTRAMUSCULAR

## 2011-06-15 MED ORDER — BUDESONIDE-FORMOTEROL FUMARATE 80-4.5 MCG/ACT IN AERO: 2.0000 | INHALATION_SPRAY | Freq: Two times a day (BID) | RESPIRATORY_TRACT | Status: DC

## 2011-06-15 MED ORDER — BENZONATATE 100 MG PO CAPS: 100.0000 mg | ORAL_CAPSULE | Freq: Three times a day (TID) | ORAL | Status: DC | PRN

## 2011-06-15 MED ORDER — CEFUROXIME AXETIL 250 MG PO TABS: 500.0000 mg | ORAL_TABLET | Freq: Two times a day (BID) | ORAL | Status: DC

## 2011-06-15 MED ORDER — HYDROCOD POLST-CPM POLST ER 10-8 MG PO CP12: 1.0000 | ORAL_CAPSULE | Freq: Two times a day (BID) | ORAL | Status: DC | PRN

## 2011-06-15 NOTE — Progress Notes (Signed)
  Subjective:    Patient ID: Anna Ellis, female    DOB: [birth year], 60 y.o.   MRN: [id number]  HPIf grass.  C/o bad cough x 2-3 wks, dry and spastic, not relieved w/OTC meds. It started after she cut 3 acres of grass. C/o sinus congestion   Review of Systems  Constitutional: Positive for chills and fatigue. Negative for fever, activity change, appetite change and unexpected weight change.  HENT: Negative for congestion, mouth sores and sinus pressure.   Eyes: Negative for visual disturbance.  Respiratory: Positive for cough, shortness of breath and wheezing. Negative for choking and chest tightness.   Cardiovascular: Negative for chest pain and palpitations.  Gastrointestinal: Negative for nausea, abdominal pain and constipation.  Genitourinary: Negative for frequency, difficulty urinating and vaginal pain.  Musculoskeletal: Negative for back pain and gait problem.  Skin: Negative for pallor and rash.  Neurological: Negative for dizziness, tremors, weakness, numbness and headaches.  Psychiatric/Behavioral: Negative for confusion and sleep disturbance. The patient is not nervous/anxious.        Objective:   Physical Exam  Constitutional: She appears well-developed and well-nourished. No distress.  HENT:  Head: Normocephalic.  Right Ear: External ear normal.  Left Ear: External ear normal.  Nose: Nose normal.  Mouth/Throat: Oropharynx is clear and moist.       eryth throat  Eyes: Conjunctivae are normal. Pupils are equal, round, and reactive to light. Right eye exhibits no discharge. Left eye exhibits no discharge.  Neck: Normal range of motion. Neck supple. No JVD present. No tracheal deviation present. No thyromegaly present.  Cardiovascular: Normal rate, regular rhythm and normal heart sounds.   Pulmonary/Chest: No stridor. No respiratory distress. She has no wheezes.  Abdominal: Soft. Bowel sounds are normal. She exhibits no distension and no mass. There is no tenderness.  There is no rebound and no guarding.  Musculoskeletal: She exhibits no edema and no tenderness.  Lymphadenopathy:    She has no cervical adenopathy.  Neurological: She displays normal reflexes. No cranial nerve deficit. She exhibits normal muscle tone. Coordination normal.  Skin: No rash noted. No erythema.  Psychiatric: She has a normal mood and affect. Her behavior is normal. Judgment and thought content normal.          Assessment & Plan:

## 2011-06-17 NOTE — Assessment & Plan Note (Signed)
Abx if worse

## 2011-06-17 NOTE — Assessment & Plan Note (Signed)
On Meds 

## 2011-06-17 NOTE — Assessment & Plan Note (Addendum)
Cont avoidance See Meds

## 2011-06-17 NOTE — Assessment & Plan Note (Signed)
Pulm cons

## 2011-06-17 NOTE — Assessment & Plan Note (Signed)
Depo 120 mg IM Symbicort bid

## 2011-07-10 ENCOUNTER — Institutional Professional Consult (permissible substitution): Payer: 59 | Admitting: Critical Care Medicine

## 2011-07-11 ENCOUNTER — Ambulatory Visit (INDEPENDENT_AMBULATORY_CARE_PROVIDER_SITE_OTHER): Payer: 59 | Admitting: Critical Care Medicine

## 2011-07-11 ENCOUNTER — Encounter: Payer: Self-pay | Admitting: Critical Care Medicine

## 2011-07-11 VITALS — BP 144/88 | HR 67 | Temp 98.0°F | Ht 64.0 in | Wt 167.2 lb

## 2011-07-11 DIAGNOSIS — R05 Cough: Secondary | ICD-10-CM

## 2011-07-11 DIAGNOSIS — K219 Gastro-esophageal reflux disease without esophagitis: Secondary | ICD-10-CM | POA: Insufficient documentation

## 2011-07-11 NOTE — Patient Instructions (Signed)
No lung medications needed You have asthmatic bronchitis that is now stable Follow a reflux diet No further antibiotics needed Stay off inhalers for now Return 6 months or sooner if cough recurs

## 2011-07-11 NOTE — Progress Notes (Signed)
Subjective:    Patient ID: Anna Ellis, female    DOB: [birth year], 60 y.o.   MRN: [id number] 60 y.o.WF referred for bronchitis eval Cough This is a recurrent problem. The current episode started more than 1 year ago. The problem has been rapidly improving. The cough is non-productive. Associated symptoms include ear pain, heartburn, postnasal drip, rhinorrhea and a sore throat. Pertinent negatives include no chest pain, chills, fever, headaches, hemoptysis, myalgias, nasal congestion, rash, shortness of breath or wheezing. The symptoms are aggravated by nothing. Treatments tried: cipro, abx, symbicort  The treatment provided moderate relief. Her past medical history is significant for bronchitis, environmental allergies and pneumonia. There is no history of asthma, bronchiectasis, COPD or emphysema.       Review of Systems  Constitutional: Negative for fever, chills, diaphoresis, activity change, appetite change, fatigue and unexpected weight change.  HENT: Positive for ear pain, congestion, sore throat, rhinorrhea, postnasal drip and tinnitus. Negative for hearing loss, nosebleeds, facial swelling, sneezing, mouth sores, trouble swallowing, neck pain, neck stiffness, dental problem, voice change, sinus pressure and ear discharge.   Eyes: Positive for itching. Negative for photophobia, discharge and visual disturbance.  Respiratory: Positive for cough. Negative for apnea, hemoptysis, choking, chest tightness, shortness of breath, wheezing and stridor.   Cardiovascular: Negative for chest pain, palpitations and leg swelling.  Gastrointestinal: Positive for heartburn. Negative for nausea, vomiting, abdominal pain, constipation, blood in stool and abdominal distention.  Genitourinary: Negative for dysuria, urgency, frequency, hematuria, flank pain, decreased urine volume and difficulty urinating.  Musculoskeletal: Negative for myalgias, back pain, joint swelling, arthralgias and gait problem.    Skin: Negative for color change, pallor and rash.  Neurological: Negative for dizziness, tremors, seizures, syncope, speech difficulty, weakness, light-headedness, numbness and headaches.  Hematological: Positive for environmental allergies. Negative for adenopathy. Does not bruise/bleed easily.  Psychiatric/Behavioral: Negative for confusion, sleep disturbance and agitation. The patient is not nervous/anxious.        Objective:   Physical Exam Filed Vitals:   07/11/11 1340  BP: 144/88  Pulse: 67  Temp: 98 F (36.7 C)  TempSrc: Oral  Height: 5\' 4"  (1.626 m)  Weight: 167 lb 3.2 oz (75.841 kg)  SpO2: 100%    Gen: Pleasant, well-nourished, in no distress,  normal affect  ENT: No lesions,  mouth clear,  oropharynx clear, no postnasal drip  Neck: No JVD, no TMG, no carotid bruits  Lungs: No use of accessory muscles, no dullness to percussion, clear without rales or rhonchi  Cardiovascular: RRR, heart sounds normal, no murmur or gallops, no peripheral edema  Abdomen: soft and NT, no HSM,  BS normal  Musculoskeletal: No deformities, no cyanosis or clubbing  Neuro: alert, non focal  Skin: Warm, no lesions or rashes        Assessment & Plan:   Asthma, cough variant Pt clearly had lower airway inflammation but now spirometry is normal and cough is resolved. I see no role for ICS meds now.   Plan No need for pulm meds Follow expectantly Follow a reflux diet, given. Return 6months or sooner if needed     Updated Medication List Outpatient Encounter Prescriptions as of 07/11/2011  Medication Sig Dispense Refill  . aspirin 81 MG chewable tablet Chew 81 mg by mouth daily.        Marland Kitchen atorvastatin (LIPITOR) 10 MG tablet Take 10 mg by mouth daily.        Marland Kitchen CALCIUM-VITAMIN D PO Take by mouth.        Marland Kitchen  etodolac (LODINE XL) 500 MG 24 hr tablet Take 500 mg by mouth daily.        . Nutritional Supplements (GLUCOSAMINE COMPLEX PO) Take by mouth.        . benzonatate (TESSALON  PERLES) 100 MG capsule Take 1 capsule (100 mg total) by mouth 3 (three) times daily as needed for cough.  100 capsule  0  . Hydrocod Polst-Chlorphen Polst (TUSSICAPS) 10-8 MG CP12 Take 1 capsule by mouth 2 (two) times daily as needed.  100 each  2  . VITAMIN E PO Take by mouth.        . DISCONTD: budesonide-formoterol (SYMBICORT) 80-4.5 MCG/ACT inhaler Inhale 2 puffs into the lungs 2 (two) times daily.  1 Inhaler  12

## 2011-07-12 NOTE — Assessment & Plan Note (Addendum)
Pt clearly had lower airway inflammation but now spirometry is normal and cough is resolved. I see no role for ICS meds now.   Plan No need for pulm meds Follow expectantly Follow a reflux diet, given. Return 6months or sooner if needed

## 2011-07-25 ENCOUNTER — Encounter (INDEPENDENT_AMBULATORY_CARE_PROVIDER_SITE_OTHER): Payer: Self-pay | Admitting: General Surgery

## 2011-07-26 ENCOUNTER — Ambulatory Visit (INDEPENDENT_AMBULATORY_CARE_PROVIDER_SITE_OTHER): Payer: 59 | Admitting: General Surgery

## 2011-08-06 ENCOUNTER — Ambulatory Visit (INDEPENDENT_AMBULATORY_CARE_PROVIDER_SITE_OTHER): Payer: 59 | Admitting: General Surgery

## 2011-08-06 ENCOUNTER — Encounter (INDEPENDENT_AMBULATORY_CARE_PROVIDER_SITE_OTHER): Payer: Self-pay | Admitting: General Surgery

## 2011-08-06 DIAGNOSIS — N6019 Diffuse cystic mastopathy of unspecified breast: Secondary | ICD-10-CM | POA: Insufficient documentation

## 2011-08-06 DIAGNOSIS — Z803 Family history of malignant neoplasm of breast: Secondary | ICD-10-CM

## 2011-08-06 NOTE — Assessment & Plan Note (Signed)
No new masses or symptoms. Mammogram from February reviewed from [place].   Follow up in 1 year.

## 2011-08-06 NOTE — Assessment & Plan Note (Signed)
No new relatives with breast cancer. Continue yearly mammogram and yearly breast exam.

## 2011-08-06 NOTE — Progress Notes (Signed)
HISTORY: Patient is doing well status post history of abnormal mammograms.  She has not noticed any masses in her breasts and has not had any new breast pain.  She denies nipple discharge, nipple retraction, skin dimpling, palpable masses. She has not had any family history of breast cancer. She is retired. She did get a callback last year for a 6 month follow up mammogram, but February's mammogram cleared her for 1 year follow up.     PERTINENT REVIEW OF SYSTEMS: 12 point review of systems otherwise negative.     EXAM: Head: Normocephalic and atraumatic.  Eyes:  Conjunctivae are normal. Pupils are equal, round, and reactive to light. No scleral icterus.  Neck:  Normal range of motion. Neck supple. No tracheal deviation present. No thyromegaly present.  Resp: No respiratory distress, normal effort. Breast:   Abd: Soft. Bowel sounds are normal. Abdomen is soft, non distended and non tender No masses are palpable.  There is no rebound and no guarding.  Neurological: Alert and oriented to person, place, and time. Coordination normal.  Skin: Skin is warm and dry. No rash noted. No diaphoretic. No erythema. No pallor.  Psychiatric: Normal mood and affect. Normal behavior. Judgment and thought content normal.     ASSESSMENT AND PLAN:    Family history of breast cancer No new relatives with breast cancer. Continue yearly mammogram and yearly breast exam.    Fibrocystic breast disease No new masses or symptoms. Mammogram from February reviewed from [place].   Follow up in 1 year.         Maudry Diego, MD Surgical Oncology, General & Endocrine Surgery [facility]  Sonda Primes, MD, MD Sonda Primes, MD

## 2011-08-15 ENCOUNTER — Encounter: Payer: Self-pay | Admitting: General Surgery

## 2011-08-20 ENCOUNTER — Encounter: Payer: Self-pay | Admitting: Internal Medicine

## 2011-08-20 ENCOUNTER — Ambulatory Visit (INDEPENDENT_AMBULATORY_CARE_PROVIDER_SITE_OTHER): Payer: 59 | Admitting: Internal Medicine

## 2011-08-20 DIAGNOSIS — J309 Allergic rhinitis, unspecified: Secondary | ICD-10-CM

## 2011-08-20 DIAGNOSIS — J019 Acute sinusitis, unspecified: Secondary | ICD-10-CM

## 2011-08-20 MED ORDER — CEFUROXIME AXETIL 250 MG PO TABS: 500.0000 mg | ORAL_TABLET | Freq: Two times a day (BID) | ORAL | Status: DC

## 2011-08-20 MED ORDER — METHYLPREDNISOLONE ACETATE 80 MG/ML IJ SUSP
120.0000 mg | Freq: Once | INTRAMUSCULAR | Status: AC
  Administered 2011-08-20: 120 mg via INTRAMUSCULAR

## 2011-08-20 NOTE — Progress Notes (Signed)
  Subjective:    Patient ID: Anna Ellis, female    DOB: 09/24/1951, 60 y.o.   MRN: [id number]  HPI  complains of sinus pressure typical of infection Located over right maxillary Episodes occur 3-4 years Onset current symptoms 2 weeks ago Not responding to OTC meds and taking rx meds as rx'd  Past Medical History  Diagnosis Date  . Basal cell cancer   . Arthritis   . Hyperlipidemia   . ALLERGIC RHINITIS 09/19/2007  . Allergic urticaria 12/15/2008  . ANEMIA-IRON DEFICIENCY 09/19/2007  . ANXIETY 09/19/2007  . Asthma with hay fever 06/15/2011  . Asthma, cough variant 12/15/2008  . CAROTID BRUIT 01/08/2011  . SHOULDER PAIN 01/06/2010  . OSTEOARTHRITIS 09/19/2007     Review of Systems  HENT: Negative for ear pain, tinnitus and ear discharge.   Respiratory: Negative for shortness of breath.   Cardiovascular: Negative for chest pain.       Objective:   Physical Exam BP 130/82  Pulse 84  Temp(Src) 97.5 F (36.4 C) (Oral)  Ht 5\' 4"  (1.626 m)  SpO2 96% Constitutional: She is well-developed and well-nourished. No acute distress.  HENT: Head: Normocephalic and atraumatic, tender over R maxillary region. Ears: B TMs ok, no erythema or effusion; Nose: Nose normal.  Mouth/Throat: Oropharynx is clear and moist. No oropharyngeal exudate.  Eyes: Conjunctivae and EOM are normal. Pupils are equal, round, and reactive to light. No scleral icterus.  Neck: Normal range of motion. Neck supple. No JVD present. No thyromegaly present.  Cardiovascular: Normal rate, regular rhythm and normal heart sounds.  No murmur heard. No BLE edema. Pulmonary/Chest: Effort normal and breath sounds normal. No respiratory distress. She has no wheezes.   Skin: Skin is warm and dry. No rash noted. No erythema.  Psychiatric: She has a normal mood and affect. Her behavior is normal. Judgment and thought content normal.   Lab Results  Component Value Date   WBC 4.9 01/01/2011   HGB 14.7 01/01/2011   HCT 42.6  01/01/2011   PLT 323.0 01/01/2011   CHOL 173 01/01/2011   TRIG 74.0 01/01/2011   HDL 68.50 01/01/2011   ALT 25 01/01/2011   AST 27 01/01/2011   NA 140 01/01/2011   K 4.7 01/01/2011   CL 104 01/01/2011   CREATININE 0.6 01/01/2011   BUN 11 01/01/2011   CO2 29 01/01/2011   TSH 2.01 01/01/2011       Assessment & Plan:  Acute maxillary sinusitis - hx same, complicated by allergic rhinitis flare - reports prior tx with medrol shot and antibiotics gives best response Ceftin x 10d and IM medrol today - to call if unimproved, sooner if worse  allergic rhinitis - tx as above and continue home meds

## 2011-08-20 NOTE — Patient Instructions (Signed)
It was good to see you today. Medrol 120mg  steroid shot given today Ceftin antibiotics as previously prescribed - Your prescription(s) have been submitted to your pharmacy. Please take as directed and contact our office if you believe you are having problem(s) with the medication(s). Continue ongoing allergy and sinus treatments as your are doing, call if symptoms worse or not improved

## 2011-09-11 ENCOUNTER — Other Ambulatory Visit (INDEPENDENT_AMBULATORY_CARE_PROVIDER_SITE_OTHER): Payer: 59

## 2011-09-11 ENCOUNTER — Ambulatory Visit (INDEPENDENT_AMBULATORY_CARE_PROVIDER_SITE_OTHER): Payer: 59 | Admitting: Internal Medicine

## 2011-09-11 ENCOUNTER — Encounter: Payer: Self-pay | Admitting: Internal Medicine

## 2011-09-11 ENCOUNTER — Other Ambulatory Visit: Payer: Self-pay | Admitting: *Deleted

## 2011-09-11 VITALS — BP 140/90 | HR 84 | Temp 98.3°F | Resp 16 | Wt 168.0 lb

## 2011-09-11 DIAGNOSIS — R51 Headache: Secondary | ICD-10-CM

## 2011-09-11 DIAGNOSIS — R110 Nausea: Secondary | ICD-10-CM

## 2011-09-11 LAB — CBC WITH DIFFERENTIAL/PLATELET
Basophils Absolute: 0 10*3/uL (ref 0.0–0.1)
Basophils Relative: 0.6 % (ref 0.0–3.0)
Eosinophils Absolute: 0.3 10*3/uL (ref 0.0–0.7)
Eosinophils Relative: 5 % (ref 0.0–5.0)
HCT: 43.5 % (ref 36.0–46.0)
Hemoglobin: 14.6 g/dL (ref 12.0–15.0)
Lymphocytes Relative: 17.3 % (ref 12.0–46.0)
Lymphs Abs: 1.1 10*3/uL (ref 0.7–4.0)
MCHC: 33.7 g/dL (ref 30.0–36.0)
MCV: 95.8 fl (ref 78.0–100.0)
Monocytes Absolute: 0.6 10*3/uL (ref 0.1–1.0)
Monocytes Relative: 9.5 % (ref 3.0–12.0)
Neutro Abs: 4.1 10*3/uL (ref 1.4–7.7)
Neutrophils Relative %: 67.6 % (ref 43.0–77.0)
Platelets: 304 10*3/uL (ref 150.0–400.0)
RBC: 4.54 Mil/uL (ref 3.87–5.11)
RDW: 14.1 % (ref 11.5–14.6)
WBC: 6.1 10*3/uL (ref 4.5–10.5)

## 2011-09-11 LAB — COMPREHENSIVE METABOLIC PANEL
ALT: 20 U/L (ref 0–35)
AST: 20 U/L (ref 0–37)
Albumin: 4.2 g/dL (ref 3.5–5.2)
Alkaline Phosphatase: 64 U/L (ref 39–117)
BUN: 20 mg/dL (ref 6–23)
CO2: 29 mEq/L (ref 19–32)
Calcium: 9.1 mg/dL (ref 8.4–10.5)
Chloride: 105 mEq/L (ref 96–112)
Creatinine, Ser: 0.6 mg/dL (ref 0.4–1.2)
GFR: 101.7 mL/min (ref >60.00)
Glucose, Bld: 94 mg/dL (ref 70–99)
Potassium: 3.9 mEq/L (ref 3.5–5.1)
Sodium: 142 mEq/L (ref 135–145)
Total Bilirubin: 0.7 mg/dL (ref 0.3–1.2)
Total Protein: 6.9 g/dL (ref 6.0–8.3)

## 2011-09-11 LAB — SEDIMENTATION RATE: Sed Rate: 12 mm/hr (ref 0–22)

## 2011-09-11 MED ORDER — ACYCLOVIR 800 MG PO TABS: 800.0000 mg | ORAL_TABLET | Freq: Every day | ORAL | Status: DC

## 2011-09-11 MED ORDER — IBUPROFEN 600 MG PO TABS: ORAL_TABLET | ORAL | Status: DC

## 2011-09-11 MED ORDER — PREGABALIN 50 MG PO CAPS: 50.0000 mg | ORAL_CAPSULE | Freq: Three times a day (TID) | ORAL | Status: DC

## 2011-09-11 MED ORDER — TRAMADOL HCL 50 MG PO TABS: 50.0000 mg | ORAL_TABLET | Freq: Four times a day (QID) | ORAL | Status: DC | PRN

## 2011-09-11 MED ORDER — IBUPROFEN 600 MG PO TABS: ORAL_TABLET | ORAL | Status: AC

## 2011-09-11 NOTE — Assessment & Plan Note (Signed)
9/12 severe R HA x 5 wks - trigeminal neuralgia vs other MRI brain Labs See Meds

## 2011-09-11 NOTE — Assessment & Plan Note (Signed)
MRI brain

## 2011-09-11 NOTE — Progress Notes (Signed)
RX's were sent to [facility], she needs RX's to go to [facility], resent, Patient informed

## 2011-09-11 NOTE — Progress Notes (Signed)
  Subjective:    Patient ID: Anna Ellis, female    DOB: 1951/02/05, 60 y.o.   MRN: [id number]  HPI  C/o R sinus severe burning pain spreading to the whole 1/2 of her head in waves x 5 wks - not better with meds, recent abx and steroids.  Review of Systems  Constitutional: Positive for fatigue. Negative for fever, chills, activity change, appetite change and unexpected weight change.  HENT: Positive for congestion. Negative for rhinorrhea, sneezing, mouth sores, neck pain, neck stiffness and sinus pressure.   Eyes: Positive for pain. Negative for visual disturbance.  Respiratory: Negative for cough and chest tightness.   Gastrointestinal: Negative for nausea, abdominal pain and abdominal distention.  Genitourinary: Negative for frequency, difficulty urinating and vaginal pain.  Musculoskeletal: Negative for back pain and gait problem.  Skin: Negative for pallor and rash.  Neurological: Positive for headaches. Negative for dizziness, tremors, weakness and numbness.  Psychiatric/Behavioral: Negative for confusion and sleep disturbance.       Objective:   Physical Exam  Constitutional: She appears well-developed and well-nourished. No distress.  HENT:  Head: Normocephalic.  Right Ear: External ear normal.  Left Ear: External ear normal.  Nose: Nose normal.  Mouth/Throat: Oropharynx is clear and moist.  Eyes: Conjunctivae are normal. Pupils are equal, round, and reactive to light. Right eye exhibits no discharge. Left eye exhibits no discharge.  Neck: Normal range of motion. Neck supple. No JVD present. No tracheal deviation present. No thyromegaly present.  Cardiovascular: Normal rate, regular rhythm and normal heart sounds.   Pulmonary/Chest: No stridor. No respiratory distress. She has no wheezes.  Abdominal: Soft. Bowel sounds are normal. She exhibits no distension and no mass. There is no tenderness. There is no rebound and no guarding.  Musculoskeletal: She exhibits no edema  and no tenderness.  Lymphadenopathy:    She has no cervical adenopathy.  Neurological: She displays normal reflexes. No cranial nerve deficit. She exhibits normal muscle tone. Coordination normal.  Skin: No rash noted. No erythema.  Psychiatric: She has a normal mood and affect. Her behavior is normal. Judgment and thought content normal.   R scalp palpation is sensitive. No rash       Assessment & Plan:

## 2011-09-17 ENCOUNTER — Telehealth: Payer: Self-pay | Admitting: *Deleted

## 2011-09-17 NOTE — Telephone Encounter (Signed)
Pt called re: Depomedrol IM inj she received 08-2011. I advised her the lots that we have were not affected and she is not at risk for meningitis per Dr. Posey Rea.

## 2011-11-19 ENCOUNTER — Ambulatory Visit (INDEPENDENT_AMBULATORY_CARE_PROVIDER_SITE_OTHER): Payer: 59 | Admitting: Internal Medicine

## 2011-11-19 ENCOUNTER — Encounter: Payer: Self-pay | Admitting: Internal Medicine

## 2011-11-19 VITALS — BP 102/72 | HR 72 | Temp 99.2°F | Resp 16 | Wt 169.0 lb

## 2011-11-19 DIAGNOSIS — R51 Headache: Secondary | ICD-10-CM

## 2011-11-19 DIAGNOSIS — H6120 Impacted cerumen, unspecified ear: Secondary | ICD-10-CM

## 2011-11-19 DIAGNOSIS — J069 Acute upper respiratory infection, unspecified: Secondary | ICD-10-CM

## 2011-11-19 MED ORDER — BENZONATATE 100 MG PO CAPS: 100.0000 mg | ORAL_CAPSULE | Freq: Four times a day (QID) | ORAL | Status: DC | PRN

## 2011-11-19 MED ORDER — HYDROCOD POLST-CPM POLST ER 10-8 MG PO CP12: 1.0000 | ORAL_CAPSULE | Freq: Two times a day (BID) | ORAL | Status: DC | PRN

## 2011-11-19 MED ORDER — MUPIROCIN 2 % EX OINT: TOPICAL_OINTMENT | Freq: Two times a day (BID) | CUTANEOUS | Status: DC

## 2011-11-19 NOTE — Progress Notes (Signed)
  Subjective:    Patient ID: Anna Ellis, female    DOB: [birth year], 60 y.o.   MRN: [id number]  HPI   HPI  C/o URI sx's x 4 days. C/o ST, cough, weakness. Not better with OTC medicines. Actually, the patient is getting worse. The patient did not sleep last night due to cough.  Review of Systems  Constitutional: Positive for fever, chills and fatigue.  HENT: Positive for congestion, rhinorrhea, sneezing and postnasal drip.   Eyes: Positive for photophobia and pain. Negative for discharge and visual disturbance.  Respiratory: Positive for cough and wheezing.   Positive for chest pain.  Gastrointestinal: Negative for vomiting, abdominal pain, diarrhea and abdominal distention.  Genitourinary: Negative for dysuria and difficulty urinating.  Skin: Negative for rash.  Neurological: Positive for dizziness, weakness and light-headedness.      Review of Systems     Objective:   Physical Exam  Constitutional: She appears well-developed and well-nourished. No distress.  HENT:  Head: Normocephalic.  Right Ear: External ear normal.  Left Ear: External ear normal.  Nose: Nose normal.       eryth throat Wax L ear  Eyes: Conjunctivae are normal. Pupils are equal, round, and reactive to light. Right eye exhibits no discharge. Left eye exhibits no discharge.  Neck: Normal range of motion. Neck supple. No JVD present. No tracheal deviation present. No thyromegaly present.  Cardiovascular: Normal rate, regular rhythm and normal heart sounds.   Pulmonary/Chest: No stridor. No respiratory distress. She has no wheezes.  Abdominal: Soft. Bowel sounds are normal. She exhibits no distension and no mass. There is no tenderness. There is no rebound and no guarding.  Musculoskeletal: She exhibits no edema and no tenderness.  Lymphadenopathy:    She has no cervical adenopathy.  Neurological: She displays normal reflexes. No cranial nerve deficit. She exhibits normal muscle tone. Coordination normal.   Skin: No rash noted. No erythema.  Psychiatric: She has a normal mood and affect. Her behavior is normal. Judgment and thought content normal.          Assessment & Plan:

## 2011-11-19 NOTE — Assessment & Plan Note (Signed)
Removed w/a loop

## 2011-11-20 ENCOUNTER — Encounter: Payer: Self-pay | Admitting: Internal Medicine

## 2011-11-20 NOTE — Assessment & Plan Note (Signed)
Acute 12/12 See meds

## 2011-11-20 NOTE — Assessment & Plan Note (Signed)
Resolved for now 

## 2011-11-21 ENCOUNTER — Ambulatory Visit: Payer: 59

## 2011-11-22 ENCOUNTER — Ambulatory Visit (INDEPENDENT_AMBULATORY_CARE_PROVIDER_SITE_OTHER): Payer: 59 | Admitting: *Deleted

## 2011-11-22 DIAGNOSIS — Z23 Encounter for immunization: Secondary | ICD-10-CM

## 2011-12-13 ENCOUNTER — Telehealth: Payer: Self-pay | Admitting: *Deleted

## 2011-12-13 DIAGNOSIS — Z0000 Encounter for general adult medical examination without abnormal findings: Secondary | ICD-10-CM

## 2011-12-13 NOTE — Telephone Encounter (Signed)
Feb CPE labs entered.

## 2012-01-09 ENCOUNTER — Other Ambulatory Visit (INDEPENDENT_AMBULATORY_CARE_PROVIDER_SITE_OTHER): Payer: 59

## 2012-01-09 DIAGNOSIS — Z0000 Encounter for general adult medical examination without abnormal findings: Secondary | ICD-10-CM

## 2012-01-09 LAB — CBC WITH DIFFERENTIAL/PLATELET
Basophils Absolute: 0 10*3/uL (ref 0.0–0.1)
Basophils Relative: 0.9 % (ref 0.0–3.0)
Eosinophils Absolute: 0.2 10*3/uL (ref 0.0–0.7)
Eosinophils Relative: 6.3 % — ABNORMAL HIGH (ref 0.0–5.0)
HCT: 42.2 % (ref 36.0–46.0)
Hemoglobin: 14.4 g/dL (ref 12.0–15.0)
Lymphocytes Relative: 26.9 % (ref 12.0–46.0)
Lymphs Abs: 1 10*3/uL (ref 0.7–4.0)
MCHC: 34.2 g/dL (ref 30.0–36.0)
MCV: 95.8 fl (ref 78.0–100.0)
Monocytes Absolute: 0.4 10*3/uL (ref 0.1–1.0)
Monocytes Relative: 11.6 % (ref 3.0–12.0)
Neutro Abs: 2 10*3/uL (ref 1.4–7.7)
Neutrophils Relative %: 54.3 % (ref 43.0–77.0)
Platelets: 313 10*3/uL (ref 150.0–400.0)
RBC: 4.4 Mil/uL (ref 3.87–5.11)
RDW: 12.8 % (ref 11.5–14.6)
WBC: 3.6 10*3/uL — ABNORMAL LOW (ref 4.5–10.5)

## 2012-01-09 LAB — BASIC METABOLIC PANEL
BUN: 15 mg/dL (ref 6–23)
CO2: 29 mEq/L (ref 19–32)
Calcium: 9.4 mg/dL (ref 8.4–10.5)
Chloride: 106 mEq/L (ref 96–112)
Creatinine, Ser: 0.7 mg/dL (ref 0.4–1.2)
GFR: 97.99 mL/min (ref >60.00)
Glucose, Bld: 78 mg/dL (ref 70–99)
Potassium: 5.4 mEq/L — ABNORMAL HIGH (ref 3.5–5.1)
Sodium: 142 mEq/L (ref 135–145)

## 2012-01-09 LAB — HEPATIC FUNCTION PANEL
ALT: 21 U/L (ref 0–35)
AST: 23 U/L (ref 0–37)
Albumin: 4.1 g/dL (ref 3.5–5.2)
Alkaline Phosphatase: 67 U/L (ref 39–117)
Bilirubin, Direct: 0.1 mg/dL (ref 0.0–0.3)
Total Bilirubin: 0.4 mg/dL (ref 0.3–1.2)
Total Protein: 6.7 g/dL (ref 6.0–8.3)

## 2012-01-09 LAB — URINALYSIS, ROUTINE W REFLEX MICROSCOPIC
Hgb urine dipstick: NEGATIVE
Ketones, ur: NEGATIVE
Leukocytes, UA: NEGATIVE
Nitrite: NEGATIVE
Specific Gravity, Urine: 1.02 (ref 1.000–1.030)
Total Protein, Urine: NEGATIVE
Urine Glucose: NEGATIVE
Urobilinogen, UA: 0.2 (ref 0.0–1.0)
pH: 6 (ref 5.0–8.0)

## 2012-01-09 LAB — LIPID PANEL
Cholesterol: 165 mg/dL (ref 0–200)
HDL: 63.2 mg/dL (ref >39.00)
LDL Cholesterol: 87 mg/dL (ref 0–99)
Total CHOL/HDL Ratio: 3
Triglycerides: 74 mg/dL (ref 0.0–149.0)
VLDL: 14.8 mg/dL (ref 0.0–40.0)

## 2012-01-09 LAB — TSH: TSH: 3.05 u[IU]/mL (ref 0.35–5.50)

## 2012-01-14 ENCOUNTER — Ambulatory Visit (INDEPENDENT_AMBULATORY_CARE_PROVIDER_SITE_OTHER): Payer: 59 | Admitting: Internal Medicine

## 2012-01-14 ENCOUNTER — Encounter: Payer: Self-pay | Admitting: Internal Medicine

## 2012-01-14 DIAGNOSIS — N3090 Cystitis, unspecified without hematuria: Secondary | ICD-10-CM | POA: Insufficient documentation

## 2012-01-14 DIAGNOSIS — Z23 Encounter for immunization: Secondary | ICD-10-CM

## 2012-01-14 DIAGNOSIS — Z0000 Encounter for general adult medical examination without abnormal findings: Secondary | ICD-10-CM | POA: Insufficient documentation

## 2012-01-14 DIAGNOSIS — E875 Hyperkalemia: Secondary | ICD-10-CM

## 2012-01-14 DIAGNOSIS — E785 Hyperlipidemia, unspecified: Secondary | ICD-10-CM

## 2012-01-14 DIAGNOSIS — F411 Generalized anxiety disorder: Secondary | ICD-10-CM

## 2012-01-14 MED ORDER — ALPRAZOLAM 0.25 MG PO TABS: 0.2500 mg | ORAL_TABLET | Freq: Two times a day (BID) | ORAL | Status: DC

## 2012-01-14 MED ORDER — CIPROFLOXACIN HCL 250 MG PO TABS: ORAL_TABLET | ORAL | Status: DC

## 2012-01-14 MED ORDER — ETODOLAC ER 500 MG PO TB24: 500.0000 mg | ORAL_TABLET | Freq: Every day | ORAL | Status: DC

## 2012-01-14 MED ORDER — ESTROGENS, CONJUGATED 0.625 MG/GM VA CREA: TOPICAL_CREAM | VAGINAL | Status: DC

## 2012-01-14 MED ORDER — ATORVASTATIN CALCIUM 10 MG PO TABS: 10.0000 mg | ORAL_TABLET | Freq: Every day | ORAL | Status: DC

## 2012-01-14 MED ORDER — TRAMADOL HCL 50 MG PO TABS: 50.0000 mg | ORAL_TABLET | Freq: Four times a day (QID) | ORAL | Status: DC | PRN

## 2012-01-14 MED ORDER — ATORVASTATIN CALCIUM 20 MG PO TABS: 20.0000 mg | ORAL_TABLET | Freq: Every day | ORAL | Status: DC

## 2012-01-14 NOTE — Assessment & Plan Note (Signed)
Will recheck

## 2012-01-14 NOTE — Assessment & Plan Note (Signed)
Continue with current prescription therapy as reflected on the Med list.  

## 2012-01-14 NOTE — Progress Notes (Signed)
  Subjective:    Patient ID: Anna Ellis, female    DOB: 04/30/1951, 61 y.o.   MRN: [id number]  HPI  The patient is here for a wellness exam. The patient has been doing well overall without major physical or psychological issues going on lately, except for bladder pains at times The patient needs to address  chronic hypertension that has been well controlled with medicines; to address chronic  hyperlipidemia controlled with medicines as well; and to address type 2 chronic diabetes, controlled with medical treatment and diet.   Review of Systems  Constitutional: Negative for chills, activity change, appetite change, fatigue and unexpected weight change.  HENT: Negative for congestion, mouth sores and sinus pressure.   Eyes: Negative for visual disturbance.  Respiratory: Negative for cough and chest tightness.   Gastrointestinal: Negative for nausea and abdominal pain.  Genitourinary: Negative for frequency, difficulty urinating and vaginal pain.  Musculoskeletal: Negative for back pain and gait problem.  Skin: Negative for pallor and rash.  Neurological: Negative for dizziness, tremors, weakness, numbness and headaches.  Psychiatric/Behavioral: Negative for confusion and sleep disturbance.       Objective:   Physical Exam  Constitutional: She appears well-developed. No distress.  HENT:  Head: Normocephalic.  Right Ear: External ear normal.  Left Ear: External ear normal.  Nose: Nose normal.  Mouth/Throat: Oropharynx is clear and moist.  Eyes: Conjunctivae are normal. Pupils are equal, round, and reactive to light. Right eye exhibits no discharge. Left eye exhibits no discharge.  Neck: Normal range of motion. Neck supple. No JVD present. No tracheal deviation present. No thyromegaly present.  Cardiovascular: Normal rate, regular rhythm and normal heart sounds.   Pulmonary/Chest: No stridor. No respiratory distress. She has no wheezes.  Abdominal: Soft. Bowel sounds are normal.  She exhibits no distension and no mass. There is no tenderness. There is no rebound and no guarding.  Musculoskeletal: She exhibits no edema and no tenderness.  Lymphadenopathy:    She has no cervical adenopathy.  Neurological: She displays normal reflexes. No cranial nerve deficit. She exhibits normal muscle tone. Coordination normal.  Skin: No rash noted. No erythema.  Psychiatric: She has a normal mood and affect. Her behavior is normal. Judgment and thought content normal.   Lab Results  Component Value Date   WBC 3.6* 01/09/2012   HGB 14.4 01/09/2012   HCT 42.2 01/09/2012   PLT 313.0 01/09/2012   GLUCOSE 78 01/09/2012   CHOL 165 01/09/2012   TRIG 74.0 01/09/2012   HDL 63.20 01/09/2012   LDLCALC 87 01/09/2012   ALT 21 01/09/2012   AST 23 01/09/2012   NA 142 01/09/2012   K 5.4* 01/09/2012   CL 106 01/09/2012   CREATININE 0.7 01/09/2012   BUN 15 01/09/2012   CO2 29 01/09/2012   TSH 3.05 01/09/2012          Assessment & Plan:

## 2012-01-14 NOTE — Assessment & Plan Note (Signed)
Chronic  Potential benefits of a long term benzodiazepines  use as well as potential risks  and complications were explained to the patient and were aknowledged. 

## 2012-01-14 NOTE — Assessment & Plan Note (Signed)
We discussed age appropriate health related issues, including available/recomended screening tests and vaccinations. We discussed a need for adhering to healthy diet and exercise. Labs/EKG were reviewed/ordered. All questions were answered.   

## 2012-01-14 NOTE — Assessment & Plan Note (Signed)
Try Cipro, Premarin, Try HCT cream

## 2012-03-05 ENCOUNTER — Encounter (INDEPENDENT_AMBULATORY_CARE_PROVIDER_SITE_OTHER): Payer: Self-pay

## 2012-04-08 ENCOUNTER — Other Ambulatory Visit (INDEPENDENT_AMBULATORY_CARE_PROVIDER_SITE_OTHER): Payer: 59

## 2012-04-08 DIAGNOSIS — E875 Hyperkalemia: Secondary | ICD-10-CM

## 2012-04-08 DIAGNOSIS — E785 Hyperlipidemia, unspecified: Secondary | ICD-10-CM

## 2012-04-08 DIAGNOSIS — Z0000 Encounter for general adult medical examination without abnormal findings: Secondary | ICD-10-CM

## 2012-04-08 DIAGNOSIS — N3090 Cystitis, unspecified without hematuria: Secondary | ICD-10-CM

## 2012-04-08 DIAGNOSIS — F411 Generalized anxiety disorder: Secondary | ICD-10-CM

## 2012-04-08 LAB — URINALYSIS, ROUTINE W REFLEX MICROSCOPIC
Ketones, ur: NEGATIVE
Leukocytes, UA: NEGATIVE
Nitrite: NEGATIVE
Specific Gravity, Urine: 1.025 (ref 1.000–1.030)
Total Protein, Urine: NEGATIVE
Urine Glucose: NEGATIVE
Urobilinogen, UA: 0.2 (ref 0.0–1.0)
pH: 6 (ref 5.0–8.0)

## 2012-04-08 LAB — BASIC METABOLIC PANEL
BUN: 16 mg/dL (ref 6–23)
CO2: 29 mEq/L (ref 19–32)
Calcium: 9.5 mg/dL (ref 8.4–10.5)
Chloride: 107 mEq/L (ref 96–112)
Creatinine, Ser: 0.6 mg/dL (ref 0.4–1.2)
GFR: 101.51 mL/min (ref >60.00)
Glucose, Bld: 84 mg/dL (ref 70–99)
Potassium: 4.4 mEq/L (ref 3.5–5.1)
Sodium: 142 mEq/L (ref 135–145)

## 2012-04-14 ENCOUNTER — Encounter: Payer: Self-pay | Admitting: Internal Medicine

## 2012-04-14 ENCOUNTER — Ambulatory Visit (INDEPENDENT_AMBULATORY_CARE_PROVIDER_SITE_OTHER): Payer: 59 | Admitting: Internal Medicine

## 2012-04-14 VITALS — BP 132/80 | HR 84 | Temp 97.8°F | Resp 16 | Wt 172.0 lb

## 2012-04-14 DIAGNOSIS — N3090 Cystitis, unspecified without hematuria: Secondary | ICD-10-CM

## 2012-04-14 DIAGNOSIS — E875 Hyperkalemia: Secondary | ICD-10-CM

## 2012-04-14 DIAGNOSIS — R51 Headache: Secondary | ICD-10-CM

## 2012-04-14 MED ORDER — ACYCLOVIR 800 MG PO TABS: 800.0000 mg | ORAL_TABLET | Freq: Every day | ORAL | Status: DC

## 2012-04-14 NOTE — Assessment & Plan Note (Signed)
9/12 severe R HA x 5 wks - trigeminal neuralgia vs other

## 2012-04-14 NOTE — Assessment & Plan Note (Signed)
2/13 - poss lab error Rechecked nl

## 2012-04-14 NOTE — Progress Notes (Signed)
  Subjective:    Patient ID: Anna Ellis, female    DOB: January 14, 1951, 61 y.o.   MRN: [id number]  HPI  F/u elev K, urinary complaints C/o L ear ache F/u trigeminal neuralgia Knee OA  Wt Readings from Last 3 Encounters:  04/14/12 172 lb (78.019 kg)  01/14/12 170 lb (77.111 kg)  11/19/11 169 lb (76.658 kg)   BP Readings from Last 3 Encounters:  04/14/12 132/80  01/14/12 140/90  11/19/11 102/72     .   Review of Systems  Constitutional: Negative for fever, chills, diaphoresis, activity change, appetite change, fatigue and unexpected weight change.  HENT: Positive for ear pain (L). Negative for hearing loss, congestion, sore throat, rhinorrhea, sneezing, mouth sores, neck pain, dental problem, voice change, postnasal drip and sinus pressure.   Eyes: Negative for pain and visual disturbance.  Respiratory: Negative for cough, chest tightness, wheezing and stridor.   Cardiovascular: Negative for chest pain, palpitations and leg swelling.  Gastrointestinal: Negative for nausea, vomiting, abdominal pain, blood in stool, abdominal distention and rectal pain.  Genitourinary: Negative for dysuria, hematuria, decreased urine volume, vaginal bleeding, vaginal discharge, difficulty urinating, vaginal pain and menstrual problem.  Musculoskeletal: Negative for back pain, joint swelling and gait problem.  Skin: Negative for color change, rash and wound.  Neurological: Negative for dizziness, tremors, syncope, speech difficulty and light-headedness.  Hematological: Negative for adenopathy.  Psychiatric/Behavioral: Negative for suicidal ideas, hallucinations, behavioral problems, confusion, sleep disturbance, dysphoric mood and decreased concentration. The patient is not hyperactive.        Objective:   Physical Exam  Constitutional: She appears well-developed. No distress.  HENT:  Head: Normocephalic.  Right Ear: External ear normal.  Left Ear: External ear normal.  Nose: Nose normal.    Mouth/Throat: Oropharynx is clear and moist.       Tubes in B ears  Eyes: Conjunctivae are normal. Pupils are equal, round, and reactive to light. Right eye exhibits no discharge. Left eye exhibits no discharge.  Neck: Normal range of motion. Neck supple. No JVD present. No tracheal deviation present. No thyromegaly present.  Cardiovascular: Normal rate, regular rhythm and normal heart sounds.   Pulmonary/Chest: No stridor. No respiratory distress. She has no wheezes.  Abdominal: Soft. Bowel sounds are normal. She exhibits no distension and no mass. There is no tenderness. There is no rebound and no guarding.  Musculoskeletal: She exhibits no edema and no tenderness.  Lymphadenopathy:    She has no cervical adenopathy.  Neurological: She displays normal reflexes. No cranial nerve deficit. She exhibits normal muscle tone. Coordination normal.  Skin: No rash noted. No erythema.  Psychiatric: She has a normal mood and affect. Her behavior is normal. Judgment and thought content normal.    Lab Results  Component Value Date   WBC 3.6* 01/09/2012   HGB 14.4 01/09/2012   HCT 42.2 01/09/2012   PLT 313.0 01/09/2012   GLUCOSE 84 04/08/2012   CHOL 165 01/09/2012   TRIG 74.0 01/09/2012   HDL 63.20 01/09/2012   LDLCALC 87 01/09/2012   ALT 21 01/09/2012   AST 23 01/09/2012   NA 142 04/08/2012   K 4.4 04/08/2012   CL 107 04/08/2012   CREATININE 0.6 04/08/2012   BUN 16 04/08/2012   CO2 29 04/08/2012   TSH 3.05 01/09/2012         Assessment & Plan:

## 2012-04-15 ENCOUNTER — Encounter: Payer: Self-pay | Admitting: Internal Medicine

## 2012-04-15 NOTE — Assessment & Plan Note (Signed)
Resolved

## 2012-07-16 ENCOUNTER — Encounter (INDEPENDENT_AMBULATORY_CARE_PROVIDER_SITE_OTHER): Payer: 59 | Admitting: General Surgery

## 2012-07-25 ENCOUNTER — Ambulatory Visit (INDEPENDENT_AMBULATORY_CARE_PROVIDER_SITE_OTHER): Payer: 59 | Admitting: General Surgery

## 2012-07-25 ENCOUNTER — Encounter (INDEPENDENT_AMBULATORY_CARE_PROVIDER_SITE_OTHER): Payer: Self-pay | Admitting: General Surgery

## 2012-07-25 VITALS — BP 140/78 | HR 72 | Temp 97.8°F | Resp 14 | Ht 64.0 in | Wt 169.2 lb

## 2012-07-25 DIAGNOSIS — R1031 Right lower quadrant pain: Secondary | ICD-10-CM | POA: Insufficient documentation

## 2012-07-25 DIAGNOSIS — N6019 Diffuse cystic mastopathy of unspecified breast: Secondary | ICD-10-CM

## 2012-07-25 NOTE — Assessment & Plan Note (Signed)
Possible recurrent right inguinal hernia.  Call if symptoms increase.

## 2012-07-25 NOTE — Patient Instructions (Signed)
Follow up for breast exam next June/july.  Call earlier if your groin pain increases.    Get mammogram in February.

## 2012-07-25 NOTE — Assessment & Plan Note (Addendum)
Pt without clinical evidence of breast cancer.   Mammogram and exam negative. Pt with very dense breasts.    Follow up in around 10-11 months. Will try to stagger mammogram with my appt and PCP appt.

## 2012-07-25 NOTE — Progress Notes (Addendum)
HISTORY: Patient is a 61 year old female who has a family history of breast cancer in her mother and a history of abnormal mammograms.  She also has very dense breasts. She has not had to get called back now for two mammogram cycles.  She has not felt any breast masses and has not felt any change in her breast contour. She denies nipple discharge. She has not had any new family members with breast cancer.  Patient does have her ovaries and uses estrogen vaginal cream but no other estrogen containing products.  Of note she has noticed some right groin pain. This was very severe for a few days that has lessened dramatically. She had a right open inguinal hernia repair in 1971 in the [employer].  She denies obstructive symptoms.     PERTINENT REVIEW OF SYSTEMS: Otherwise negative.     EXAM: Head: Normocephalic and atraumatic.  Eyes:  Conjunctivae are normal. Pupils are equal, round, and reactive to light. No scleral icterus.  Neck:  Normal range of motion. Neck supple. No tracheal deviation present. No thyromegaly present.  Resp: No respiratory distress, normal effort. Breasts:  No masses or skin dimpling.  No nipple discharge.  No axillary or supraclavicular lymphadenopathy.  Slight difference in breast symmetry that is stable when patient elevates arm.   Abd:  Abdomen is soft, non distended and non tender. No masses are palpable.  There is no rebound and no guarding.  Groin:  Laxity in right inguinal floor.  No hernia on left.   Neurological: Alert and oriented to person, place, and time. Coordination normal.  Skin: Skin is warm and dry. No rash noted. No diaphoretic. No erythema. No pallor.  Psychiatric: Normal mood and affect. Normal behavior. Judgment and thought content normal.      ASSESSMENT AND PLAN:   Fibrocystic breast disease Pt without clinical evidence of breast cancer.   Mammogram and exam negative. Pt with very dense breasts.    Follow up in around 10-11 months. Will try to  stagger mammogram with my appt and PCP appt.     Right groin pain Possible recurrent right inguinal hernia.  Call if symptoms increase.      Maudry Diego, MD Surgical Oncology, General & Endocrine Surgery [facility]  Sonda Primes, MD Plotnikov, Georgina Quint, MD

## 2012-10-29 ENCOUNTER — Ambulatory Visit (INDEPENDENT_AMBULATORY_CARE_PROVIDER_SITE_OTHER): Payer: 59 | Admitting: Internal Medicine

## 2012-10-29 ENCOUNTER — Encounter: Payer: Self-pay | Admitting: Internal Medicine

## 2012-10-29 VITALS — BP 140/96 | HR 80 | Temp 97.4°F | Resp 16 | Wt 170.0 lb

## 2012-10-29 DIAGNOSIS — R51 Headache: Secondary | ICD-10-CM

## 2012-10-29 DIAGNOSIS — H5710 Ocular pain, unspecified eye: Secondary | ICD-10-CM

## 2012-10-29 DIAGNOSIS — J019 Acute sinusitis, unspecified: Secondary | ICD-10-CM

## 2012-10-29 DIAGNOSIS — Z23 Encounter for immunization: Secondary | ICD-10-CM

## 2012-10-29 DIAGNOSIS — H5711 Ocular pain, right eye: Secondary | ICD-10-CM

## 2012-10-29 DIAGNOSIS — L500 Allergic urticaria: Secondary | ICD-10-CM

## 2012-10-29 DIAGNOSIS — E785 Hyperlipidemia, unspecified: Secondary | ICD-10-CM

## 2012-10-29 MED ORDER — PREDNISONE 10 MG PO TABS: ORAL_TABLET | ORAL | Status: DC

## 2012-10-29 MED ORDER — ACYCLOVIR 800 MG PO TABS: 800.0000 mg | ORAL_TABLET | Freq: Every day | ORAL | Status: DC

## 2012-10-29 MED ORDER — CEFUROXIME AXETIL 250 MG PO TABS: 500.0000 mg | ORAL_TABLET | Freq: Two times a day (BID) | ORAL | Status: DC

## 2012-10-29 MED ORDER — PREDNISOLONE ACETATE 1 % OP SUSP: 1.0000 [drp] | Freq: Four times a day (QID) | OPHTHALMIC | Status: DC

## 2012-10-29 NOTE — Progress Notes (Signed)
  Subjective:    Patient ID: Anna Ellis, female    DOB: October 27, 1951, 61 y.o.   MRN: [id number]  Eye Pain  Pertinent negatives include no fever, nausea or vomiting.    C/o R eye and R cheek pain x 2 wks - severe F/u trigeminal neuralgia R   Wt Readings from Last 3 Encounters:  10/29/12 170 lb (77.111 kg)  07/25/12 169 lb 4 oz (76.771 kg)  04/14/12 172 lb (78.019 kg)   BP Readings from Last 3 Encounters:  10/29/12 140/96  07/25/12 140/78  04/14/12 132/80     .   Review of Systems  Constitutional: Negative for fever, chills, diaphoresis, activity change, appetite change, fatigue and unexpected weight change.  HENT: Positive for ear pain (L). Negative for hearing loss, congestion, sore throat, rhinorrhea, sneezing, mouth sores, neck pain, dental problem, voice change, postnasal drip and sinus pressure.   Eyes: Positive for pain. Negative for visual disturbance.  Respiratory: Negative for cough, chest tightness, wheezing and stridor.   Cardiovascular: Negative for chest pain, palpitations and leg swelling.  Gastrointestinal: Negative for nausea, vomiting, abdominal pain, blood in stool, abdominal distention and rectal pain.  Genitourinary: Negative for dysuria, hematuria, decreased urine volume, vaginal bleeding, vaginal discharge, difficulty urinating, vaginal pain and menstrual problem.  Musculoskeletal: Negative for back pain, joint swelling and gait problem.  Skin: Negative for color change, rash and wound.  Neurological: Negative for dizziness, tremors, syncope, speech difficulty and light-headedness.  Hematological: Negative for adenopathy.  Psychiatric/Behavioral: Negative for suicidal ideas, hallucinations, behavioral problems, confusion, sleep disturbance, dysphoric mood and decreased concentration. The patient is not hyperactive.        Objective:   Physical Exam  Constitutional: She appears well-developed. No distress.  HENT:  Head: Normocephalic.  Right Ear:  External ear normal.  Left Ear: External ear normal.  Nose: Nose normal.  Mouth/Throat: Oropharynx is clear and moist.       Tubes in B ears  Eyes: Conjunctivae normal are normal. Pupils are equal, round, and reactive to light. Right eye exhibits no discharge. Left eye exhibits no discharge.  Neck: Normal range of motion. Neck supple. No JVD present. No tracheal deviation present. No thyromegaly present.  Cardiovascular: Normal rate, regular rhythm and normal heart sounds.   Pulmonary/Chest: No stridor. No respiratory distress. She has no wheezes.  Abdominal: Soft. Bowel sounds are normal. She exhibits no distension and no mass. There is no tenderness. There is no rebound and no guarding.  Musculoskeletal: She exhibits no edema and no tenderness.  Lymphadenopathy:    She has no cervical adenopathy.  Neurological: She displays normal reflexes. No cranial nerve deficit. She exhibits normal muscle tone. Coordination normal.  Skin: No rash noted. No erythema.  Psychiatric: She has a normal mood and affect. Her behavior is normal. Judgment and thought content normal.    Lab Results  Component Value Date   WBC 3.6* 01/09/2012   HGB 14.4 01/09/2012   HCT 42.2 01/09/2012   PLT 313.0 01/09/2012   GLUCOSE 84 04/08/2012   CHOL 165 01/09/2012   TRIG 74.0 01/09/2012   HDL 63.20 01/09/2012   LDLCALC 87 01/09/2012   ALT 21 01/09/2012   AST 23 01/09/2012   NA 142 04/08/2012   K 4.4 04/08/2012   CL 107 04/08/2012   CREATININE 0.6 04/08/2012   BUN 16 04/08/2012   CO2 29 04/08/2012   TSH 3.05 01/09/2012         Assessment & Plan:

## 2012-10-30 DIAGNOSIS — H9201 Otalgia, right ear: Secondary | ICD-10-CM | POA: Insufficient documentation

## 2012-10-30 DIAGNOSIS — H5711 Ocular pain, right eye: Secondary | ICD-10-CM | POA: Insufficient documentation

## 2012-10-30 NOTE — Assessment & Plan Note (Signed)
Continue with current prescription therapy as reflected on the Med list.  

## 2012-10-30 NOTE — Assessment & Plan Note (Signed)
9/12 severe R HA x 5 wks - trigeminal neuralgia vs other Pred forte eye drops

## 2012-10-30 NOTE — Assessment & Plan Note (Signed)
Allergic to red dye

## 2012-10-30 NOTE — Assessment & Plan Note (Signed)
9/12 severe R HA x 5 wks - trigeminal neuralgia vs other Empiric Acyclovir Pred forte eye drops

## 2012-11-18 ENCOUNTER — Telehealth: Payer: Self-pay | Admitting: Internal Medicine

## 2012-11-18 MED ORDER — BENZONATATE 100 MG PO CAPS: 100.0000 mg | ORAL_CAPSULE | Freq: Two times a day (BID) | ORAL | Status: DC | PRN

## 2012-11-18 MED ORDER — HYDROCOD POLST-CPM POLST ER 10-8 MG PO CP12: ORAL_CAPSULE | ORAL | Status: DC

## 2012-11-18 NOTE — Telephone Encounter (Signed)
Needs cough meds OK

## 2012-12-19 ENCOUNTER — Telehealth: Payer: Self-pay | Admitting: *Deleted

## 2012-12-19 DIAGNOSIS — Z0000 Encounter for general adult medical examination without abnormal findings: Secondary | ICD-10-CM

## 2012-12-19 NOTE — Telephone Encounter (Signed)
Message copied by Merrilyn Puma on Fri Dec 19, 2012  2:15 PM ------      Message from: Etheleen Sia      Created: Thu Nov 13, 2012  2:40 PM      Regarding: LABS       PHYSICAL LABS FOR FEB APPT

## 2012-12-19 NOTE — Telephone Encounter (Signed)
Labs entered.

## 2013-01-06 ENCOUNTER — Other Ambulatory Visit (INDEPENDENT_AMBULATORY_CARE_PROVIDER_SITE_OTHER): Payer: 59

## 2013-01-06 DIAGNOSIS — Z0000 Encounter for general adult medical examination without abnormal findings: Secondary | ICD-10-CM

## 2013-01-06 LAB — CBC WITH DIFFERENTIAL/PLATELET
Basophils Absolute: 0 10*3/uL (ref 0.0–0.1)
Basophils Relative: 0.7 % (ref 0.0–3.0)
Eosinophils Absolute: 0.2 10*3/uL (ref 0.0–0.7)
Eosinophils Relative: 5.6 % — ABNORMAL HIGH (ref 0.0–5.0)
HCT: 41.7 % (ref 36.0–46.0)
Hemoglobin: 14.1 g/dL (ref 12.0–15.0)
Lymphocytes Relative: 30.5 % (ref 12.0–46.0)
Lymphs Abs: 1.2 10*3/uL (ref 0.7–4.0)
MCHC: 33.7 g/dL (ref 30.0–36.0)
MCV: 93.4 fl (ref 78.0–100.0)
Monocytes Absolute: 0.4 10*3/uL (ref 0.1–1.0)
Monocytes Relative: 10.3 % (ref 3.0–12.0)
Neutro Abs: 2.1 10*3/uL (ref 1.4–7.7)
Neutrophils Relative %: 52.9 % (ref 43.0–77.0)
Platelets: 322 10*3/uL (ref 150.0–400.0)
RBC: 4.47 Mil/uL (ref 3.87–5.11)
RDW: 13.1 % (ref 11.5–14.6)
WBC: 3.9 10*3/uL — ABNORMAL LOW (ref 4.5–10.5)

## 2013-01-06 LAB — URINALYSIS, ROUTINE W REFLEX MICROSCOPIC
Ketones, ur: NEGATIVE
Leukocytes, UA: NEGATIVE
Nitrite: NEGATIVE
Specific Gravity, Urine: 1.025 (ref 1.000–1.030)
Total Protein, Urine: NEGATIVE
Urine Glucose: NEGATIVE
Urobilinogen, UA: 0.2 (ref 0.0–1.0)
pH: 5.5 (ref 5.0–8.0)

## 2013-01-06 LAB — TSH: TSH: 2.53 u[IU]/mL (ref 0.35–5.50)

## 2013-01-06 LAB — BASIC METABOLIC PANEL
BUN: 11 mg/dL (ref 6–23)
CO2: 28 mEq/L (ref 19–32)
Calcium: 9.2 mg/dL (ref 8.4–10.5)
Chloride: 107 mEq/L (ref 96–112)
Creatinine, Ser: 0.6 mg/dL (ref 0.4–1.2)
GFR: 105.1 mL/min (ref >60.00)
Glucose, Bld: 86 mg/dL (ref 70–99)
Potassium: 4 mEq/L (ref 3.5–5.1)
Sodium: 140 mEq/L (ref 135–145)

## 2013-01-06 LAB — LIPID PANEL
Cholesterol: 164 mg/dL (ref 0–200)
HDL: 57.3 mg/dL (ref >39.00)
LDL Cholesterol: 91 mg/dL (ref 0–99)
Total CHOL/HDL Ratio: 3
Triglycerides: 77 mg/dL (ref 0.0–149.0)
VLDL: 15.4 mg/dL (ref 0.0–40.0)

## 2013-01-06 LAB — HEPATIC FUNCTION PANEL
ALT: 17 U/L (ref 0–35)
AST: 21 U/L (ref 0–37)
Albumin: 4 g/dL (ref 3.5–5.2)
Alkaline Phosphatase: 69 U/L (ref 39–117)
Bilirubin, Direct: 0.1 mg/dL (ref 0.0–0.3)
Total Bilirubin: 0.7 mg/dL (ref 0.3–1.2)
Total Protein: 6.8 g/dL (ref 6.0–8.3)

## 2013-01-14 ENCOUNTER — Encounter: Payer: Self-pay | Admitting: Internal Medicine

## 2013-01-14 ENCOUNTER — Ambulatory Visit (INDEPENDENT_AMBULATORY_CARE_PROVIDER_SITE_OTHER): Payer: 59 | Admitting: Internal Medicine

## 2013-01-14 VITALS — BP 130/90 | HR 76 | Temp 97.8°F | Resp 16 | Ht 64.0 in | Wt 173.0 lb

## 2013-01-14 DIAGNOSIS — Z0000 Encounter for general adult medical examination without abnormal findings: Secondary | ICD-10-CM

## 2013-01-14 MED ORDER — ACYCLOVIR 800 MG PO TABS: 800.0000 mg | ORAL_TABLET | Freq: Every day | ORAL | Status: DC

## 2013-01-14 MED ORDER — ATORVASTATIN CALCIUM 20 MG PO TABS: 20.0000 mg | ORAL_TABLET | Freq: Every day | ORAL | Status: DC

## 2013-01-14 MED ORDER — ETODOLAC ER 500 MG PO TB24: 500.0000 mg | ORAL_TABLET | Freq: Every day | ORAL | Status: DC

## 2013-01-14 MED ORDER — ALPRAZOLAM 0.25 MG PO TABS: 0.2500 mg | ORAL_TABLET | Freq: Two times a day (BID) | ORAL | Status: AC

## 2013-01-14 NOTE — Assessment & Plan Note (Signed)
We discussed age appropriate health related issues, including available/recomended screening tests and vaccinations. We discussed a need for adhering to healthy diet and exercise. Labs/EKG were reviewed/ordered. All questions were answered.   

## 2013-01-14 NOTE — Progress Notes (Signed)
   Subjective:     HPI  The patient is here for a wellness exam.   The patient has been doing well overall without major physical or psychological issues going on lately, except for bladder pains at times The patient needs to address  chronic hypertension that has been well controlled with medicines; to address chronic  hyperlipidemia controlled with medicines as well; and to address serous otitis, controlled with medical treatment and diet.  Wt Readings from Last 3 Encounters:  01/14/13 173 lb (78.472 kg)  10/29/12 170 lb (77.111 kg)  07/25/12 169 lb 4 oz (76.771 kg)   BP Readings from Last 3 Encounters:  01/14/13 130/90  10/29/12 140/96  07/25/12 140/78       Review of Systems  Constitutional: Negative for chills, activity change, appetite change, fatigue and unexpected weight change.  HENT: Negative for congestion, mouth sores and sinus pressure.   Eyes: Negative for visual disturbance.  Respiratory: Negative for cough and chest tightness.   Gastrointestinal: Negative for nausea and abdominal pain.  Genitourinary: Negative for frequency, difficulty urinating and vaginal pain.  Musculoskeletal: Negative for back pain and gait problem.  Skin: Negative for pallor and rash.  Neurological: Negative for dizziness, tremors, weakness, numbness and headaches.  Psychiatric/Behavioral: Negative for confusion and sleep disturbance.       Objective:   Physical Exam  Constitutional: She appears well-developed. No distress.  HENT:  Head: Normocephalic.  Right Ear: External ear normal.  Left Ear: External ear normal.  Nose: Nose normal.  Mouth/Throat: Oropharynx is clear and moist.  Eyes: Conjunctivae normal are normal. Pupils are equal, round, and reactive to light. Right eye exhibits no discharge. Left eye exhibits no discharge.  Neck: Normal range of motion. Neck supple. No JVD present. No tracheal deviation present. No thyromegaly present.  Cardiovascular: Normal rate,  regular rhythm and normal heart sounds.   Pulmonary/Chest: No stridor. No respiratory distress. She has no wheezes.  Abdominal: Soft. Bowel sounds are normal. She exhibits no distension and no mass. There is no tenderness. There is no rebound and no guarding.  Musculoskeletal: She exhibits no edema and no tenderness.  Lymphadenopathy:    She has no cervical adenopathy.  Neurological: She displays normal reflexes. No cranial nerve deficit. She exhibits normal muscle tone. Coordination normal.  Skin: No rash noted. No erythema.  Psychiatric: She has a normal mood and affect. Her behavior is normal. Judgment and thought content normal.  R ear tube  Lab Results  Component Value Date   WBC 3.9* 01/06/2013   HGB 14.1 01/06/2013   HCT 41.7 01/06/2013   PLT 322.0 01/06/2013   GLUCOSE 86 01/06/2013   CHOL 164 01/06/2013   TRIG 77.0 01/06/2013   HDL 57.30 01/06/2013   LDLCALC 91 01/06/2013   ALT 17 01/06/2013   AST 21 01/06/2013   NA 140 01/06/2013   K 4.0 01/06/2013   CL 107 01/06/2013   CREATININE 0.6 01/06/2013   BUN 11 01/06/2013   CO2 28 01/06/2013   TSH 2.53 01/06/2013          Assessment & Plan:

## 2013-02-23 ENCOUNTER — Ambulatory Visit: Payer: 59 | Admitting: Internal Medicine

## 2013-02-24 ENCOUNTER — Ambulatory Visit (INDEPENDENT_AMBULATORY_CARE_PROVIDER_SITE_OTHER): Payer: 59 | Admitting: Internal Medicine

## 2013-02-24 ENCOUNTER — Ambulatory Visit: Payer: 59 | Admitting: Internal Medicine

## 2013-02-24 ENCOUNTER — Encounter: Payer: Self-pay | Admitting: Internal Medicine

## 2013-02-24 VITALS — BP 128/82 | HR 80 | Temp 98.0°F | Resp 16 | Wt 172.0 lb

## 2013-02-24 DIAGNOSIS — K1370 Unspecified lesions of oral mucosa: Secondary | ICD-10-CM

## 2013-02-24 DIAGNOSIS — K121 Other forms of stomatitis: Secondary | ICD-10-CM | POA: Insufficient documentation

## 2013-02-24 MED ORDER — ACYCLOVIR 400 MG PO TABS: 400.0000 mg | ORAL_TABLET | Freq: Three times a day (TID) | ORAL | Status: DC

## 2013-02-24 NOTE — Assessment & Plan Note (Signed)
Acyclovir tid

## 2013-02-24 NOTE — Progress Notes (Signed)
  Subjective:    HPI  C/o mouth and tongue sores x 3 wks. She was seen at [facility] and given a mouth wash   Wt Readings from Last 3 Encounters:  02/24/13 172 lb (78.019 kg)  01/14/13 173 lb (78.472 kg)  10/29/12 170 lb (77.111 kg)   BP Readings from Last 3 Encounters:  02/24/13 128/82  01/14/13 130/90  10/29/12 140/96     .   Review of Systems  Constitutional: Negative for chills, diaphoresis, activity change, appetite change, fatigue and unexpected weight change.  HENT: Negative for hearing loss, sore throat, rhinorrhea, sneezing, mouth sores, dental problem, voice change, postnasal drip and sinus pressure.   Eyes: Negative for visual disturbance.  Respiratory: Negative for chest tightness and wheezing.   Cardiovascular: Negative for chest pain, palpitations and leg swelling.  Gastrointestinal: Negative for blood in stool, abdominal distention and rectal pain.  Genitourinary: Negative for dysuria, hematuria, decreased urine volume, vaginal bleeding, vaginal discharge, difficulty urinating, vaginal pain and menstrual problem.  Musculoskeletal: Negative for back pain, joint swelling and gait problem.  Skin: Negative for color change, rash and wound.  Neurological: Negative for dizziness, tremors, syncope, speech difficulty and light-headedness.  Hematological: Negative for adenopathy.  Psychiatric/Behavioral: Negative for suicidal ideas, hallucinations, behavioral problems, confusion, sleep disturbance, dysphoric mood and decreased concentration. The patient is not hyperactive.        Objective:   Physical Exam  Constitutional: She appears well-developed. No distress.  HENT:  Head: Normocephalic.  Right Ear: External ear normal.  Left Ear: External ear normal.  Nose: Nose normal.  Mouth/Throat: Oropharynx is clear and moist.  Tubes in B ears  Eyes: Conjunctivae are normal. Pupils are equal, round, and reactive to light. Right eye exhibits no discharge. Left eye exhibits no  discharge.  Neck: Normal range of motion. Neck supple. No JVD present. No tracheal deviation present. No thyromegaly present.  Cardiovascular: Normal rate, regular rhythm and normal heart sounds.   Pulmonary/Chest: No stridor. No respiratory distress. She has no wheezes.  Abdominal: Soft. Bowel sounds are normal. She exhibits no distension and no mass. There is no tenderness. There is no rebound and no guarding.  Musculoskeletal: She exhibits no edema and no tenderness.  Lymphadenopathy:    She has no cervical adenopathy.  Neurological: She displays normal reflexes. No cranial nerve deficit. She exhibits normal muscle tone. Coordination normal.  Skin: No rash noted. No erythema.  Psychiatric: She has a normal mood and affect. Her behavior is normal. Judgment and thought content normal.  Hard palate one 3 mm ulcer and 1 L tongue ulcer  Lab Results  Component Value Date   WBC 3.9* 01/06/2013   HGB 14.1 01/06/2013   HCT 41.7 01/06/2013   PLT 322.0 01/06/2013   GLUCOSE 86 01/06/2013   CHOL 164 01/06/2013   TRIG 77.0 01/06/2013   HDL 57.30 01/06/2013   LDLCALC 91 01/06/2013   ALT 17 01/06/2013   AST 21 01/06/2013   NA 140 01/06/2013   K 4.0 01/06/2013   CL 107 01/06/2013   CREATININE 0.6 01/06/2013   BUN 11 01/06/2013   CO2 28 01/06/2013   TSH 2.53 01/06/2013         Assessment & Plan:

## 2013-03-03 ENCOUNTER — Encounter: Payer: Self-pay | Admitting: Internal Medicine

## 2013-06-30 ENCOUNTER — Other Ambulatory Visit: Payer: Self-pay | Admitting: Otolaryngology

## 2013-06-30 DIAGNOSIS — R51 Headache: Secondary | ICD-10-CM

## 2013-07-07 ENCOUNTER — Ambulatory Visit
Admission: RE | Admit: 2013-07-07 | Discharge: 2013-07-07 | Disposition: A | Discharge status: Home / Self Care | Payer: 59 | Source: Ambulatory Visit | Attending: Otolaryngology | Admitting: Otolaryngology

## 2013-07-07 DIAGNOSIS — R51 Headache: Secondary | ICD-10-CM

## 2013-07-07 MED ORDER — GADOBENATE DIMEGLUMINE 529 MG/ML IV SOLN
15.0000 mL | Freq: Once | INTRAVENOUS | Status: AC | PRN
  Administered 2013-07-07: 15 mL via INTRAVENOUS

## 2013-07-22 DIAGNOSIS — G501 Atypical facial pain: Secondary | ICD-10-CM | POA: Insufficient documentation

## 2013-07-27 ENCOUNTER — Ambulatory Visit (INDEPENDENT_AMBULATORY_CARE_PROVIDER_SITE_OTHER): Payer: 59 | Admitting: General Surgery

## 2013-08-04 ENCOUNTER — Ambulatory Visit (INDEPENDENT_AMBULATORY_CARE_PROVIDER_SITE_OTHER): Payer: 59 | Admitting: General Surgery

## 2013-08-04 ENCOUNTER — Encounter (INDEPENDENT_AMBULATORY_CARE_PROVIDER_SITE_OTHER): Payer: Self-pay | Admitting: General Surgery

## 2013-08-04 VITALS — BP 136/80 | HR 68 | Resp 16 | Ht 64.0 in | Wt 172.0 lb

## 2013-08-04 DIAGNOSIS — Z803 Family history of malignant neoplasm of breast: Secondary | ICD-10-CM

## 2013-08-04 DIAGNOSIS — N6019 Diffuse cystic mastopathy of unspecified breast: Secondary | ICD-10-CM

## 2013-08-04 NOTE — Assessment & Plan Note (Signed)
No recent development of other family breast cancers.

## 2013-08-04 NOTE — Assessment & Plan Note (Signed)
Pt has no new breast masses or concerning findings.    Re-examine in 1 year.    Advised patient to call earlier if concerns arise.

## 2013-08-04 NOTE — Progress Notes (Signed)
HISTORY: Patient is a 63 year old female who has a family history of breast cancer in her mother and a history of abnormal mammograms.  She also has very dense breasts. She has not had to get called back now for two mammogram cycles.  She has not felt any breast masses and has not felt any change in her breast contour. She denies nipple discharge. She has not had any new family members with breast cancer. She did have a scare in march when she got called back for abnormal mammogram. She underwent ultrasound for focal asymmetry, and the ultrasound was negative for concerning lesions.  She was very anxious during this time.  She is debating whether or not to pursue 3D mammogram next year.    PERTINENT REVIEW OF SYSTEMS: Otherwise negative.     EXAM: Head: Normocephalic and atraumatic.  Eyes:  Conjunctivae are normal. Pupils are equal, round, and reactive to light. No scleral icterus.  Neck:  Normal range of motion. Neck supple. No tracheal deviation present. No thyromegaly present.  Resp: No respiratory distress, normal effort. Breasts:  No masses or skin dimpling.  No nipple discharge.  No axillary or supraclavicular lymphadenopathy.  Breasts are symmetric bilaterally.  Dense breasts bilaterally.  Abd:  Abdomen is soft, non distended and non tender. No masses are palpable.  There is no rebound and no guarding.  Neurological: Alert and oriented to person, place, and time. Coordination normal.  Skin: Skin is warm and dry. No rash noted. No diaphoretic. No erythema. No pallor.  Psychiatric: Normal mood and affect. Normal behavior. Judgment and thought content normal.      ASSESSMENT AND PLAN:   Family history of breast cancer No recent development of other family breast cancers.    Fibrocystic breast disease Pt has no new breast masses or concerning findings.    Re-examine in 1 year.    Advised patient to call earlier if concerns arise.       Maudry Diego, MD Surgical Oncology,  General & Endocrine Surgery [facility]  Sonda Primes, MD No ref. provider found

## 2013-08-04 NOTE — Patient Instructions (Signed)
Follow up in 1 year.    Get mammogram in march.    Call earlier if you develop concerns.

## 2013-08-12 ENCOUNTER — Ambulatory Visit (INDEPENDENT_AMBULATORY_CARE_PROVIDER_SITE_OTHER): Payer: 59 | Admitting: Internal Medicine

## 2013-08-12 ENCOUNTER — Encounter: Payer: Self-pay | Admitting: Internal Medicine

## 2013-08-12 VITALS — BP 140/90 | HR 80 | Temp 98.1°F | Resp 16 | Wt 172.0 lb

## 2013-08-12 DIAGNOSIS — L920 Granuloma annulare: Secondary | ICD-10-CM | POA: Insufficient documentation

## 2013-08-12 DIAGNOSIS — H5710 Ocular pain, unspecified eye: Secondary | ICD-10-CM

## 2013-08-12 DIAGNOSIS — K121 Other forms of stomatitis: Secondary | ICD-10-CM

## 2013-08-12 DIAGNOSIS — K1370 Unspecified lesions of oral mucosa: Secondary | ICD-10-CM

## 2013-08-12 DIAGNOSIS — L538 Other specified erythematous conditions: Secondary | ICD-10-CM

## 2013-08-12 DIAGNOSIS — H5711 Ocular pain, right eye: Secondary | ICD-10-CM

## 2013-08-12 MED ORDER — ACYCLOVIR 400 MG PO TABS: 400.0000 mg | ORAL_TABLET | Freq: Two times a day (BID) | ORAL | Status: DC

## 2013-08-12 MED ORDER — TOPIRAMATE 25 MG PO TABS: 25.0000 mg | ORAL_TABLET | Freq: Every day | ORAL | Status: DC

## 2013-08-12 MED ORDER — DICLOFENAC SODIUM 75 MG PO TBEC: 75.0000 mg | DELAYED_RELEASE_TABLET | Freq: Two times a day (BID) | ORAL | Status: DC

## 2013-08-12 MED ORDER — SUMATRIPTAN SUCCINATE 100 MG PO TABS: 100.0000 mg | ORAL_TABLET | Freq: Every day | ORAL | Status: DC | PRN

## 2013-08-12 NOTE — Assessment & Plan Note (Addendum)
9/12, 11/13, 9/14 severe R HA x 5 wks - trigeminal neuralgia vs migraines vs other Neurol appt w/Dr Daphine Deutscher at [facility] is pending A complex case  [facility] Rx: Topamax 25-50 mg qhs Acyclovir 400mg  bid Sumatriptan 100 mg qd prn acute pain

## 2013-08-12 NOTE — Assessment & Plan Note (Addendum)
9/14:L forearm x2 Will inject Verbal consent. Skin was cleaned. Each lesion was injected with 10 mg of DepoMedrol and 0.5 cc 1% Lidocaine.  Tolerated well Complications none Band-aid applied

## 2013-08-12 NOTE — Assessment & Plan Note (Signed)
No recent relapse 

## 2013-08-12 NOTE — Patient Instructions (Addendum)
Gluten free trial (no wheat products) for 4-6 weeks. OK to use gluten-free bread and gluten-free pasta.  Milk free trial (no milk, ice cream, cheese and yogurt) for 4-6 weeks. OK to use almond, coconut, rice or soy milk.  

## 2013-08-12 NOTE — Progress Notes (Signed)
Subjective:     Headache  This is a recurrent problem. The current episode started more than 1 year ago. The problem occurs intermittently. The problem has been unchanged. The pain is located in the right unilateral (R eye) region. Associated symptoms include eye pain (R eye). Pertinent negatives include no abdominal pain, back pain, coughing, dizziness, nausea, numbness, sinus pressure or weakness. The treatment provided no relief.      C/o R eye pain - relapsing. She saw Dr Ramond Dial, ENT. She had an MRI of her brain.  She has a Neurol appt sch for Oct 2014 at [facility].  The patient needs to address  chronic hypertension that has been well controlled with medicines; to address chronic  hyperlipidemia controlled with medicines as well; and to address serous otitis, controlled with medical treatment and diet.  Wt Readings from Last 3 Encounters:  08/12/13 172 lb (78.019 kg)  08/04/13 172 lb (78.019 kg)  02/24/13 172 lb (78.019 kg)   BP Readings from Last 3 Encounters:  08/12/13 140/90  08/04/13 136/80  02/24/13 128/82       Review of Systems  Constitutional: Negative for chills, activity change, appetite change, fatigue and unexpected weight change.  HENT: Negative for congestion, mouth sores and sinus pressure.   Eyes: Positive for pain (R eye). Negative for visual disturbance.  Respiratory: Negative for cough and chest tightness.   Gastrointestinal: Negative for nausea and abdominal pain.  Genitourinary: Negative for frequency, difficulty urinating and vaginal pain.  Musculoskeletal: Negative for back pain and gait problem.  Skin: Negative for pallor and rash.  Neurological: Negative for dizziness, tremors, weakness, numbness and headaches.  Psychiatric/Behavioral: Negative for confusion and sleep disturbance.       Objective:   Physical Exam  Constitutional: She appears well-developed. No distress.  HENT:  Head: Normocephalic.  Right Ear: External ear normal.  Left Ear:  External ear normal.  Nose: Nose normal.  Mouth/Throat: Oropharynx is clear and moist.  Eyes: Conjunctivae are normal. Pupils are equal, round, and reactive to light. Right eye exhibits no discharge. Left eye exhibits no discharge.  Neck: Normal range of motion. Neck supple. No JVD present. No tracheal deviation present. No thyromegaly present.  Cardiovascular: Normal rate, regular rhythm and normal heart sounds.   Pulmonary/Chest: No stridor. No respiratory distress. She has no wheezes.  Abdominal: Soft. Bowel sounds are normal. She exhibits no distension and no mass. There is no tenderness. There is no rebound and no guarding.  Musculoskeletal: She exhibits no edema and no tenderness.  Lymphadenopathy:    She has no cervical adenopathy.  Neurological: She displays normal reflexes. No cranial nerve deficit. She exhibits normal muscle tone. Coordination normal.  Skin: No rash noted. No erythema.  Psychiatric: She has a normal mood and affect. Her behavior is normal. Judgment and thought content normal.  R ear tube  Lab Results  Component Value Date   WBC 3.9* 01/06/2013   HGB 14.1 01/06/2013   HCT 41.7 01/06/2013   PLT 322.0 01/06/2013   GLUCOSE 86 01/06/2013   CHOL 164 01/06/2013   TRIG 77.0 01/06/2013   HDL 57.30 01/06/2013   LDLCALC 91 01/06/2013   ALT 17 01/06/2013   AST 21 01/06/2013   NA 140 01/06/2013   K 4.0 01/06/2013   CL 107 01/06/2013   CREATININE 0.6 01/06/2013   BUN 11 01/06/2013   CO2 28 01/06/2013   TSH 2.53 01/06/2013   MRI brain  A complex case     Assessment &  Plan:

## 2013-10-15 ENCOUNTER — Other Ambulatory Visit: Payer: Self-pay

## 2013-11-19 ENCOUNTER — Telehealth: Payer: Self-pay | Admitting: Internal Medicine

## 2013-11-19 ENCOUNTER — Ambulatory Visit (INDEPENDENT_AMBULATORY_CARE_PROVIDER_SITE_OTHER): Payer: 59 | Admitting: *Deleted

## 2013-11-19 ENCOUNTER — Other Ambulatory Visit: Payer: Self-pay | Admitting: Internal Medicine

## 2013-11-19 DIAGNOSIS — Z23 Encounter for immunization: Secondary | ICD-10-CM

## 2013-11-19 MED ORDER — TRIAMCINOLONE ACETONIDE 0.5 % EX CREA: 1.0000 "application " | TOPICAL_CREAM | Freq: Three times a day (TID) | CUTANEOUS | Status: DC

## 2013-11-19 MED ORDER — PREDNISONE 10 MG PO TABS: ORAL_TABLET | ORAL | Status: DC

## 2013-11-19 NOTE — Telephone Encounter (Signed)
C/o hives

## 2014-01-18 ENCOUNTER — Encounter: Payer: 59 | Admitting: Internal Medicine

## 2014-01-18 ENCOUNTER — Encounter: Payer: Self-pay | Admitting: Internal Medicine

## 2014-01-18 ENCOUNTER — Ambulatory Visit (INDEPENDENT_AMBULATORY_CARE_PROVIDER_SITE_OTHER): Payer: 59 | Admitting: Internal Medicine

## 2014-01-18 ENCOUNTER — Other Ambulatory Visit (INDEPENDENT_AMBULATORY_CARE_PROVIDER_SITE_OTHER): Payer: 59

## 2014-01-18 VITALS — BP 164/88 | HR 72 | Temp 98.3°F | Resp 16 | Ht 64.0 in | Wt 167.0 lb

## 2014-01-18 DIAGNOSIS — F411 Generalized anxiety disorder: Secondary | ICD-10-CM

## 2014-01-18 DIAGNOSIS — Z0000 Encounter for general adult medical examination without abnormal findings: Secondary | ICD-10-CM

## 2014-01-18 LAB — CBC WITH DIFFERENTIAL/PLATELET
Basophils Absolute: 0 10*3/uL (ref 0.0–0.1)
Basophils Relative: 0.7 % (ref 0.0–3.0)
Eosinophils Absolute: 0.3 10*3/uL (ref 0.0–0.7)
Eosinophils Relative: 7.3 % — ABNORMAL HIGH (ref 0.0–5.0)
HCT: 42.6 % (ref 36.0–46.0)
Hemoglobin: 14.3 g/dL (ref 12.0–15.0)
Lymphocytes Relative: 31.2 % (ref 12.0–46.0)
Lymphs Abs: 1.2 10*3/uL (ref 0.7–4.0)
MCHC: 33.4 g/dL (ref 30.0–36.0)
MCV: 95.8 fl (ref 78.0–100.0)
Monocytes Absolute: 0.4 10*3/uL (ref 0.1–1.0)
Monocytes Relative: 10.3 % (ref 3.0–12.0)
Neutro Abs: 2 10*3/uL (ref 1.4–7.7)
Neutrophils Relative %: 50.5 % (ref 43.0–77.0)
Platelets: 342 10*3/uL (ref 150.0–400.0)
RBC: 4.45 Mil/uL (ref 3.87–5.11)
RDW: 13.4 % (ref 11.5–14.6)
WBC: 3.9 10*3/uL — ABNORMAL LOW (ref 4.5–10.5)

## 2014-01-18 LAB — HEPATIC FUNCTION PANEL
ALT: 23 U/L (ref 0–35)
AST: 20 U/L (ref 0–37)
Albumin: 4.1 g/dL (ref 3.5–5.2)
Alkaline Phosphatase: 69 U/L (ref 39–117)
Bilirubin, Direct: 0 mg/dL (ref 0.0–0.3)
Total Bilirubin: 0.5 mg/dL (ref 0.3–1.2)
Total Protein: 6.7 g/dL (ref 6.0–8.3)

## 2014-01-18 LAB — URINALYSIS, ROUTINE W REFLEX MICROSCOPIC
Bilirubin Urine: NEGATIVE
Ketones, ur: NEGATIVE
Leukocytes, UA: NEGATIVE
Nitrite: NEGATIVE
Specific Gravity, Urine: 1.02 (ref 1.000–1.030)
Total Protein, Urine: NEGATIVE
Urine Glucose: NEGATIVE
Urobilinogen, UA: 0.2 (ref 0.0–1.0)
pH: 6 (ref 5.0–8.0)

## 2014-01-18 LAB — BASIC METABOLIC PANEL
BUN: 20 mg/dL (ref 6–23)
CO2: 24 mEq/L (ref 19–32)
Calcium: 9.3 mg/dL (ref 8.4–10.5)
Chloride: 106 mEq/L (ref 96–112)
Creatinine, Ser: 0.6 mg/dL (ref 0.4–1.2)
GFR: 99.11 mL/min (ref >60.00)
Glucose, Bld: 80 mg/dL (ref 70–99)
Potassium: 3.9 mEq/L (ref 3.5–5.1)
Sodium: 141 mEq/L (ref 135–145)

## 2014-01-18 LAB — LIPID PANEL
Cholesterol: 174 mg/dL (ref 0–200)
HDL: 60.2 mg/dL (ref >39.00)
LDL Cholesterol: 99 mg/dL (ref 0–99)
Total CHOL/HDL Ratio: 3
Triglycerides: 73 mg/dL (ref 0.0–149.0)
VLDL: 14.6 mg/dL (ref 0.0–40.0)

## 2014-01-18 LAB — TSH: TSH: 1.82 u[IU]/mL (ref 0.35–5.50)

## 2014-01-18 MED ORDER — BENZONATATE 200 MG PO CAPS: 200.0000 mg | ORAL_CAPSULE | Freq: Two times a day (BID) | ORAL | Status: DC | PRN

## 2014-01-18 MED ORDER — ALPRAZOLAM 0.25 MG PO TABS: 0.2500 mg | ORAL_TABLET | Freq: Two times a day (BID) | ORAL | Status: DC | PRN

## 2014-01-18 MED ORDER — BUTALBITAL-ACETAMINOPHEN 50-300 MG PO TABS: 1.0000 | ORAL_TABLET | Freq: Two times a day (BID) | ORAL | Status: DC | PRN

## 2014-01-18 MED ORDER — DICLOFENAC SODIUM 75 MG PO TBEC: 75.0000 mg | DELAYED_RELEASE_TABLET | Freq: Two times a day (BID) | ORAL | Status: DC

## 2014-01-18 MED ORDER — ATORVASTATIN CALCIUM 20 MG PO TABS: 20.0000 mg | ORAL_TABLET | Freq: Every day | ORAL | Status: DC

## 2014-01-18 MED ORDER — ACYCLOVIR 800 MG PO TABS: 800.0000 mg | ORAL_TABLET | Freq: Every day | ORAL | Status: DC

## 2014-01-18 MED ORDER — MUPIROCIN 2 % EX OINT: 1.0000 "application " | TOPICAL_OINTMENT | Freq: Every day | CUTANEOUS | Status: DC

## 2014-01-18 NOTE — Progress Notes (Signed)
Pre visit review using our clinic review tool, if applicable. No additional management support is needed unless otherwise documented below in the visit note. 

## 2014-01-18 NOTE — Assessment & Plan Note (Signed)
Continue with current prescription therapy as reflected on the Med list.  

## 2014-01-18 NOTE — Assessment & Plan Note (Signed)
We discussed age appropriate health related issues, including available/recomended screening tests and vaccinations. We discussed a need for adhering to healthy diet and exercise. Labs/EKG were reviewed/ordered. All questions were answered.   

## 2014-01-18 NOTE — Progress Notes (Signed)
Patient ID: Anna Ellis, female   DOB: 05/21/1951, 63 y.o.   MRN: [id number]  Subjective:    Patient ID: Anna Ellis, female    DOB: [birth year], 63 y.o.   MRN: [id number]  HPI  The patient is here for a wellness exam. The patient has been doing well overall without major physical or psychological issues going on lately, except for bladder pains at times The patient needs to address  chronic hypertension that has been well controlled with medicines; to address chronic  hyperlipidemia controlled with medicines as well; and to address type 2 chronic diabetes, controlled with medical treatment and diet.   Review of Systems  Constitutional: Negative for chills, activity change, appetite change, fatigue and unexpected weight change.  HENT: Negative for congestion, mouth sores and sinus pressure.   Eyes: Negative for visual disturbance.  Respiratory: Negative for cough and chest tightness.   Gastrointestinal: Negative for nausea and abdominal pain.  Genitourinary: Negative for frequency, difficulty urinating and vaginal pain.  Musculoskeletal: Negative for back pain and gait problem.  Skin: Negative for pallor and rash.  Neurological: Negative for dizziness, tremors, weakness, numbness and headaches.  Psychiatric/Behavioral: Negative for confusion and sleep disturbance.       Objective:   Physical Exam  Constitutional: She appears well-developed. No distress.  HENT:  Head: Normocephalic.  Right Ear: External ear normal.  Left Ear: External ear normal.  Nose: Nose normal.  Mouth/Throat: Oropharynx is clear and moist.  Eyes: Conjunctivae are normal. Pupils are equal, round, and reactive to light. Right eye exhibits no discharge. Left eye exhibits no discharge.  Neck: Normal range of motion. Neck supple. No JVD present. No tracheal deviation present. No thyromegaly present.  Cardiovascular: Normal rate, regular rhythm and normal heart sounds.   Pulmonary/Chest: No stridor. No  respiratory distress. She has no wheezes.  Abdominal: Soft. Bowel sounds are normal. She exhibits no distension and no mass. There is no tenderness. There is no rebound and no guarding.  Musculoskeletal: She exhibits no edema and no tenderness.  Lymphadenopathy:    She has no cervical adenopathy.  Neurological: She displays normal reflexes. No cranial nerve deficit. She exhibits normal muscle tone. Coordination normal.  Skin: No rash noted. No erythema.  Psychiatric: She has a normal mood and affect. Her behavior is normal. Judgment and thought content normal.   Lab Results  Component Value Date   WBC 3.9* 01/06/2013   HGB 14.1 01/06/2013   HCT 41.7 01/06/2013   PLT 322.0 01/06/2013   GLUCOSE 86 01/06/2013   CHOL 164 01/06/2013   TRIG 77.0 01/06/2013   HDL 57.30 01/06/2013   LDLCALC 91 01/06/2013   ALT 17 01/06/2013   AST 21 01/06/2013   NA 140 01/06/2013   K 4.0 01/06/2013   CL 107 01/06/2013   CREATININE 0.6 01/06/2013   BUN 11 01/06/2013   CO2 28 01/06/2013   TSH 2.53 01/06/2013          Assessment & Plan:

## 2014-03-15 ENCOUNTER — Encounter: Payer: Self-pay | Admitting: Internal Medicine

## 2014-08-06 ENCOUNTER — Encounter (INDEPENDENT_AMBULATORY_CARE_PROVIDER_SITE_OTHER): Payer: Self-pay | Admitting: General Surgery

## 2014-08-06 ENCOUNTER — Ambulatory Visit (INDEPENDENT_AMBULATORY_CARE_PROVIDER_SITE_OTHER): Payer: Medicare Other | Admitting: General Surgery

## 2014-08-06 VITALS — BP 140/90 | HR 76 | Temp 98.0°F | Ht 64.0 in | Wt 172.2 lb

## 2014-08-06 DIAGNOSIS — N6019 Diffuse cystic mastopathy of unspecified breast: Secondary | ICD-10-CM

## 2014-08-06 DIAGNOSIS — Z803 Family history of malignant neoplasm of breast: Secondary | ICD-10-CM

## 2014-08-06 NOTE — Progress Notes (Signed)
HISTORY: Patient is a 63 year old female who has a family history of breast cancer in her mother and a history of abnormal mammograms.  She also has very dense breasts. Pt has numerous callbacks in the past, the most recent one was with her first 3D mammogram.   She is back to screening mammograms.   She has no new breast nodules.  She occasionally has breast pain, but this is getting less frequent.  It resolves spontaneously.  She describes the pain as "feeling like the blood lets down into my breast."  When this occurs, she starts taking vitamin E and it resolves.       PERTINENT REVIEW OF SYSTEMS: Otherwise negative x 11 except for bilateral SI joint pain and left foot toe spacing      EXAM: Wt Readings from Last 3 Encounters:  08/06/14 172 lb 4 oz (78.132 kg)  01/18/14 167 lb (75.751 kg)  08/12/13 172 lb (78.019 kg)   Temp Readings from Last 3 Encounters:  08/06/14 98 F (36.7 C) Oral  01/18/14 98.3 F (36.8 C) Oral  08/12/13 98.1 F (36.7 C) Oral   BP Readings from Last 3 Encounters:  08/06/14 140/90  01/18/14 164/88  08/12/13 140/90   Pulse Readings from Last 3 Encounters:  08/06/14 76  01/18/14 72  08/12/13 80    Head: Normocephalic and atraumatic.  Eyes:  Conjunctivae are normal. Pupils are equal, round, and reactive to light. No scleral icterus.  Neck:  Normal range of motion. Neck supple. No tracheal deviation present. No thyromegaly present.  Resp: No respiratory distress, normal effort. Breasts:  No masses or skin dimpling.  No nipple discharge.  No axillary or supraclavicular lymphadenopathy.  Breasts are symmetric bilaterally.  Dense breasts bilaterally. No nipple retraction.   Abd:  Abdomen is soft, non distended and non tender. No masses are palpable.  There is no rebound and no guarding.  Neurological: Alert and oriented to person, place, and time. Coordination normal.  Skin: Skin is warm and dry. No rash noted. No diaphoretic. No erythema. No pallor.   Psychiatric: Normal mood and affect. Normal behavior. Judgment and thought content normal.      ASSESSMENT AND PLAN:   Fibrocystic breast disease No evidence of breast cancer  Follow up in 1 year.  Continue vitamin E prn breast pain.   Family history of breast cancer No clinical evidence of disease.     Milus Height, MD Surgical Oncology, [facility]  Walker Kehr, MD Plotnikov, Evie Lacks, MD

## 2014-08-06 NOTE — Assessment & Plan Note (Signed)
No clinical evidence of disease.

## 2014-08-06 NOTE — Patient Instructions (Signed)
Follow up in 1 year.  Mammogram in March 2016

## 2014-08-06 NOTE — Assessment & Plan Note (Signed)
No evidence of breast cancer  Follow up in 1 year.  Continue vitamin E prn breast pain.

## 2014-09-24 ENCOUNTER — Other Ambulatory Visit: Payer: Self-pay

## 2015-01-19 ENCOUNTER — Encounter: Payer: Self-pay | Admitting: Internal Medicine

## 2015-01-20 ENCOUNTER — Ambulatory Visit (INDEPENDENT_AMBULATORY_CARE_PROVIDER_SITE_OTHER): Payer: Medicare Other | Admitting: Internal Medicine

## 2015-01-20 ENCOUNTER — Other Ambulatory Visit (INDEPENDENT_AMBULATORY_CARE_PROVIDER_SITE_OTHER): Payer: Medicare Other

## 2015-01-20 ENCOUNTER — Encounter: Payer: Self-pay | Admitting: Internal Medicine

## 2015-01-20 VITALS — BP 160/85 | HR 73 | Temp 97.9°F | Ht 64.0 in | Wt 174.0 lb

## 2015-01-20 DIAGNOSIS — Z0000 Encounter for general adult medical examination without abnormal findings: Secondary | ICD-10-CM | POA: Diagnosis not present

## 2015-01-20 DIAGNOSIS — E785 Hyperlipidemia, unspecified: Secondary | ICD-10-CM | POA: Diagnosis not present

## 2015-01-20 DIAGNOSIS — M533 Sacrococcygeal disorders, not elsewhere classified: Secondary | ICD-10-CM

## 2015-01-20 DIAGNOSIS — Z23 Encounter for immunization: Secondary | ICD-10-CM

## 2015-01-20 DIAGNOSIS — M25552 Pain in left hip: Secondary | ICD-10-CM

## 2015-01-20 DIAGNOSIS — R030 Elevated blood-pressure reading, without diagnosis of hypertension: Secondary | ICD-10-CM

## 2015-01-20 DIAGNOSIS — G8929 Other chronic pain: Secondary | ICD-10-CM

## 2015-01-20 LAB — CBC WITH DIFFERENTIAL/PLATELET
Basophils Absolute: 0 10*3/uL (ref 0.0–0.1)
Basophils Relative: 0.6 % (ref 0.0–3.0)
Eosinophils Absolute: 0.3 10*3/uL (ref 0.0–0.7)
Eosinophils Relative: 5.9 % — ABNORMAL HIGH (ref 0.0–5.0)
HCT: 40.8 % (ref 36.0–46.0)
Hemoglobin: 14.2 g/dL (ref 12.0–15.0)
Lymphocytes Relative: 28.5 % (ref 12.0–46.0)
Lymphs Abs: 1.4 10*3/uL (ref 0.7–4.0)
MCHC: 34.7 g/dL (ref 30.0–36.0)
MCV: 91.8 fl (ref 78.0–100.0)
Monocytes Absolute: 0.5 10*3/uL (ref 0.1–1.0)
Monocytes Relative: 10.9 % (ref 3.0–12.0)
Neutro Abs: 2.7 10*3/uL (ref 1.4–7.7)
Neutrophils Relative %: 54.1 % (ref 43.0–77.0)
Platelets: 372 10*3/uL (ref 150.0–400.0)
RBC: 4.45 Mil/uL (ref 3.87–5.11)
RDW: 13.4 % (ref 11.5–15.5)
WBC: 4.9 10*3/uL (ref 4.0–10.5)

## 2015-01-20 LAB — BASIC METABOLIC PANEL
BUN: 22 mg/dL (ref 6–23)
CO2: 28 mEq/L (ref 19–32)
Calcium: 9.5 mg/dL (ref 8.4–10.5)
Chloride: 106 mEq/L (ref 96–112)
Creatinine, Ser: 0.73 mg/dL (ref 0.40–1.20)
GFR: 84.88 mL/min (ref >60.00)
Glucose, Bld: 90 mg/dL (ref 70–99)
Potassium: 4 mEq/L (ref 3.5–5.1)
Sodium: 140 mEq/L (ref 135–145)

## 2015-01-20 LAB — LIPID PANEL
Cholesterol: 169 mg/dL (ref 0–200)
HDL: 58.4 mg/dL (ref >39.00)
LDL Cholesterol: 92 mg/dL (ref 0–99)
NonHDL: 110.6
Total CHOL/HDL Ratio: 3
Triglycerides: 95 mg/dL (ref 0.0–149.0)
VLDL: 19 mg/dL (ref 0.0–40.0)

## 2015-01-20 LAB — URINALYSIS
Leukocytes, UA: NEGATIVE
Nitrite: NEGATIVE
Specific Gravity, Urine: >=1.03 — AB (ref 1.000–1.030)
Total Protein, Urine: NEGATIVE
Urine Glucose: NEGATIVE
Urobilinogen, UA: 0.2 (ref 0.0–1.0)
pH: 5.5 (ref 5.0–8.0)

## 2015-01-20 LAB — TSH: TSH: 2.46 u[IU]/mL (ref 0.35–4.50)

## 2015-01-20 LAB — HEPATIC FUNCTION PANEL
ALT: 23 U/L (ref 0–35)
AST: 21 U/L (ref 0–37)
Albumin: 4.4 g/dL (ref 3.5–5.2)
Alkaline Phosphatase: 67 U/L (ref 39–117)
Bilirubin, Direct: 0.1 mg/dL (ref 0.0–0.3)
Total Bilirubin: 0.5 mg/dL (ref 0.2–1.2)
Total Protein: 6.7 g/dL (ref 6.0–8.3)

## 2015-01-20 MED ORDER — IBUPROFEN 800 MG PO TABS: 800.0000 mg | ORAL_TABLET | Freq: Two times a day (BID) | ORAL | Status: DC | PRN

## 2015-01-20 MED ORDER — ATORVASTATIN CALCIUM 20 MG PO TABS: 20.0000 mg | ORAL_TABLET | Freq: Every day | ORAL | Status: DC

## 2015-01-20 MED ORDER — ACYCLOVIR 800 MG PO TABS: 800.0000 mg | ORAL_TABLET | Freq: Every day | ORAL | Status: DC

## 2015-01-20 MED ORDER — PREDNISOLONE ACETATE 1 % OP SUSP: 1.0000 [drp] | Freq: Four times a day (QID) | OPHTHALMIC | Status: DC

## 2015-01-20 MED ORDER — ALPRAZOLAM 0.25 MG PO TABS: 0.2500 mg | ORAL_TABLET | Freq: Two times a day (BID) | ORAL | Status: DC | PRN

## 2015-01-20 MED ORDER — TRAMADOL HCL 50 MG PO TABS: 25.0000 mg | ORAL_TABLET | Freq: Four times a day (QID) | ORAL | Status: DC | PRN

## 2015-01-20 NOTE — Patient Instructions (Signed)
Normal BP<130/85 

## 2015-01-20 NOTE — Assessment & Plan Note (Addendum)

## 2015-01-20 NOTE — Progress Notes (Signed)
   Subjective:    HPI  The patient is here for a wellness exam. The patient has been doing well overall without major physical or psychological issues going on lately, except for bladder pains at times   The patient needs to address  chronic hypertension that has been well controlled with medicines; to address chronic  hyperlipidemia controlled with medicines as well; and to address type 2 chronic diabetes, controlled with medical treatment and diet.   Review of Systems  Constitutional: Negative for chills, activity change, appetite change, fatigue and unexpected weight change.  HENT: Negative for congestion, mouth sores and sinus pressure.   Eyes: Negative for visual disturbance.  Respiratory: Negative for cough and chest tightness.   Gastrointestinal: Negative for nausea and abdominal pain.  Genitourinary: Negative for frequency, difficulty urinating and vaginal pain.  Musculoskeletal: Negative for back pain and gait problem.  Skin: Negative for pallor and rash.  Neurological: Negative for dizziness, tremors, weakness, numbness and headaches.  Psychiatric/Behavioral: Negative for confusion and sleep disturbance.       Objective:   Physical Exam  Constitutional: She appears well-developed. No distress.  HENT:  Head: Normocephalic.  Right Ear: External ear normal.  Left Ear: External ear normal.  Nose: Nose normal.  Mouth/Throat: Oropharynx is clear and moist.  Eyes: Conjunctivae are normal. Pupils are equal, round, and reactive to light. Right eye exhibits no discharge. Left eye exhibits no discharge.  Neck: Normal range of motion. Neck supple. No JVD present. No tracheal deviation present. No thyromegaly present.  Cardiovascular: Normal rate, regular rhythm and normal heart sounds.   Pulmonary/Chest: No stridor. No respiratory distress. She has no wheezes.  Abdominal: Soft. Bowel sounds are normal. She exhibits no distension and no mass. There is no tenderness. There is no  rebound and no guarding.  Musculoskeletal: She exhibits no edema or tenderness.  Lymphadenopathy:    She has no cervical adenopathy.  Neurological: She displays normal reflexes. No cranial nerve deficit. She exhibits normal muscle tone. Coordination normal.  Skin: No rash noted. No erythema.  Psychiatric: She has a normal mood and affect. Her behavior is normal. Judgment and thought content normal.  L SI joint is tender   Lab Results  Component Value Date   WBC 3.9* 01/18/2014   HGB 14.3 01/18/2014   HCT 42.6 01/18/2014   PLT 342.0 01/18/2014   GLUCOSE 80 01/18/2014   CHOL 174 01/18/2014   TRIG 73.0 01/18/2014   HDL 60.20 01/18/2014   LDLCALC 99 01/18/2014   ALT 23 01/18/2014   AST 20 01/18/2014   NA 141 01/18/2014   K 3.9 01/18/2014   CL 106 01/18/2014   CREATININE 0.6 01/18/2014   BUN 20 01/18/2014   CO2 24 01/18/2014   TSH 1.82 01/18/2014          Assessment & Plan:

## 2015-01-20 NOTE — Progress Notes (Signed)
Pre visit review using our clinic review tool, if applicable. No additional management support is needed unless otherwise documented below in the visit note. 

## 2015-01-20 NOTE — Assessment & Plan Note (Signed)
Sports Med ref Ibuprofen 800 mg prn Tramadol prn  Potential benefits of a long term opioids use as well as potential risks (i.e. addiction risk, apnea etc) and complications (i.e. Somnolence, constipation and others) were explained to the patient and were aknowledged.

## 2015-01-20 NOTE — Assessment & Plan Note (Signed)
Check BP at home and report

## 2015-01-31 ENCOUNTER — Ambulatory Visit: Payer: 59 | Admitting: Family Medicine

## 2015-02-11 ENCOUNTER — Ambulatory Visit (INDEPENDENT_AMBULATORY_CARE_PROVIDER_SITE_OTHER): Payer: Medicare Other | Admitting: Internal Medicine

## 2015-02-11 ENCOUNTER — Encounter: Payer: Self-pay | Admitting: Internal Medicine

## 2015-02-11 VITALS — BP 140/98 | HR 98 | Temp 98.2°F | Ht 64.0 in | Wt 167.8 lb

## 2015-02-11 DIAGNOSIS — J0101 Acute recurrent maxillary sinusitis: Secondary | ICD-10-CM

## 2015-02-11 MED ORDER — CEFUROXIME AXETIL 250 MG PO TABS: 250.0000 mg | ORAL_TABLET | Freq: Two times a day (BID) | ORAL | Status: DC

## 2015-02-11 NOTE — Patient Instructions (Signed)

## 2015-02-11 NOTE — Progress Notes (Signed)
   Subjective:    Patient ID: Anna Ellis, female    DOB: 1951-06-23, 64 y.o.   MRN: [id number]  HPI Symptoms began 02/06/15 as right frontal and maxillary sinus area pain and pain in the right eyebrow. She is producing some yellow nasal discharge.  She has a past history of chronic eye issues for which she's taking acyclovir. She is actively seeing her Ophthalmologist & in the last 2 weeks was placed on steroid eyedrops.  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 She denies other upper respiratory tract infection symptoms or extrinsic symptoms.  She is exposed to a 1.5 yo grandchild.   Review of Systems Dental pain, sore throat , otic pain or otic discharge denied. No fever , chills or sweats. Extrinsic symptoms of itchy, watery eyes, sneezing, or angioedema are denied. There is no significant cough, sputum production, wheezing,or  paroxysmal nocturnal dyspnea.    Objective:   Physical Exam   General appearance:Adequately nourished; no acute distress or increased work of breathing is present.  No  lymphadenopathy about the head, neck, or axilla noted.   Eyes: No conjunctival inflammation or lid edema is present. There is no scleral icterus.Minimal ptosis of the right eye is present.   Ears:  External ear exam shows no significant lesions or deformities.  Otoscopic examination reveals clear canals, tympanic membranes are intact bilaterally without bulging, retraction, inflammation or discharge.  Nose:  External nasal examination shows no deformity or inflammation.She has marked erythema of the right nare. No septal dislocation or deviation.No obstruction to airflow.   Oral exam: Dental hygiene is good; lips and gums are healthy appearing.There is no oropharyngeal erythema or exudate noted.   Neck:  No deformities, thyromegaly, masses, or tenderness noted.   Supple with full range of motion without pain.   Heart:  Normal rate and regular rhythm. S1 and S2 normal  without gallop, murmur, click, rub or other extra sounds.   Lungs:Chest clear to auscultation; no wheezes, rhonchi,rales ,or rubs present.  Extremities:  No cyanosis, edema, or clubbing  noted    Skin: Warm & dry w/o jaundice or tenting.      Assessment & Plan:  #1 rhinosinusitis without  bronchitis  Plan: Nasal hygiene interventions discussed. See prescription medications

## 2015-02-11 NOTE — Progress Notes (Signed)
Pre visit review using our clinic review tool, if applicable. No additional management support is needed unless otherwise documented below in the visit note. 

## 2015-09-19 ENCOUNTER — Encounter: Payer: Self-pay | Admitting: Internal Medicine

## 2015-09-19 ENCOUNTER — Ambulatory Visit (INDEPENDENT_AMBULATORY_CARE_PROVIDER_SITE_OTHER): Payer: Medicare Other | Admitting: Internal Medicine

## 2015-09-19 VITALS — BP 148/100 | HR 75 | Temp 97.9°F | Wt 168.0 lb

## 2015-09-19 DIAGNOSIS — Z23 Encounter for immunization: Secondary | ICD-10-CM | POA: Diagnosis not present

## 2015-09-19 DIAGNOSIS — J0101 Acute recurrent maxillary sinusitis: Secondary | ICD-10-CM

## 2015-09-19 DIAGNOSIS — H5711 Ocular pain, right eye: Secondary | ICD-10-CM

## 2015-09-19 MED ORDER — CEFUROXIME AXETIL 250 MG PO TABS: 250.0000 mg | ORAL_TABLET | Freq: Two times a day (BID) | ORAL | Status: DC

## 2015-09-19 MED ORDER — ACYCLOVIR 800 MG PO TABS: 800.0000 mg | ORAL_TABLET | Freq: Every day | ORAL | Status: DC

## 2015-09-19 MED ORDER — METHYLPREDNISOLONE ACETATE 80 MG/ML IJ SUSP
80.0000 mg | Freq: Once | INTRAMUSCULAR | Status: AC
  Administered 2015-09-19: 80 mg via INTRAMUSCULAR

## 2015-09-19 MED ORDER — ETODOLAC 500 MG PO TABS: ORAL_TABLET | ORAL | Status: DC

## 2015-09-19 NOTE — Progress Notes (Signed)
Pre visit review using our clinic review tool, if applicable. No additional management support is needed unless otherwise documented below in the visit note. 

## 2015-09-19 NOTE — Assessment & Plan Note (Signed)
9/12, 11/13, 9/14 severe R HA x 5 wks - trigeminal neuralgia vs migraines vs  Other. A complex case S/p Neurol eval w/Dr Hassell Done at [facility]   Depo-medrol 80 mg IM Abx

## 2015-09-19 NOTE — Progress Notes (Signed)
Subjective:  Patient ID: Anna Ellis, female    DOB: 1951/01/04  Age: 64 y.o. MRN: [id number]  CC: No chief complaint on file.   HPI Anna Ellis presents for  Rt eye pain, ST, sinus pain x3-4 weeks  Outpatient Prescriptions Prior to Visit  Medication Sig Dispense Refill  . ALPRAZolam (XANAX) 0.25 MG tablet Take 1 tablet (0.25 mg total) by mouth 2 (two) times daily as needed for anxiety. 30 tablet 1  . aspirin 81 MG chewable tablet Chew 81 mg by mouth daily.      Marland Kitchen atorvastatin (LIPITOR) 20 MG tablet Take 1 tablet (20 mg total) by mouth daily. 90 tablet 3  . mupirocin ointment (BACTROBAN) 2 % Place 1 application into the nose daily. 30 g 0  . prednisoLONE acetate (PRED FORTE) 1 % ophthalmic suspension Place 1 drop into the right eye 4 (four) times daily. Use prn 5 mL 1  . traMADol (ULTRAM) 50 MG tablet Take 0.5-1 tablets (25-50 mg total) by mouth every 6 (six) hours as needed for moderate pain or severe pain. 100 tablet 3  . VITAMIN E PO Take by mouth daily.     . cefUROXime (CEFTIN) 250 MG tablet Take 1 tablet (250 mg total) by mouth 2 (two) times daily with a meal. 20 tablet 0  . diclofenac (VOLTAREN) 75 MG EC tablet Take 1 tablet (75 mg total) by mouth 2 (two) times daily. (Patient not taking: Reported on 09/19/2015) 180 tablet 3  . acyclovir (ZOVIRAX) 800 MG tablet Take 1 tablet (800 mg total) by mouth 5 (five) times daily. 50 tablet 0  . ibuprofen (ADVIL,MOTRIN) 800 MG tablet Take 1 tablet (800 mg total) by mouth 2 (two) times daily as needed. (Patient not taking: Reported on 09/19/2015) 60 tablet 5   No facility-administered medications prior to visit.    ROS Review of Systems  Constitutional: Negative for fever, chills, activity change, appetite change, fatigue and unexpected weight change.  HENT: Positive for congestion and sinus pressure. Negative for mouth sores.   Eyes: Positive for pain. Negative for photophobia, discharge, itching and visual disturbance.    Respiratory: Negative for cough and chest tightness.   Gastrointestinal: Negative for nausea and abdominal pain.  Genitourinary: Negative for frequency, difficulty urinating and vaginal pain.  Musculoskeletal: Negative for back pain and gait problem.  Skin: Negative for pallor and rash.  Neurological: Positive for headaches. Negative for dizziness, tremors, weakness and numbness.  Psychiatric/Behavioral: Negative for confusion and sleep disturbance.    Objective:  BP 148/100 mmHg  Pulse 75  Temp(Src) 97.9 F (36.6 C) (Oral)  Wt 168 lb (76.204 kg)  SpO2 98%  BP Readings from Last 3 Encounters:  09/19/15 148/100  02/11/15 140/98  01/20/15 160/85    Wt Readings from Last 3 Encounters:  09/19/15 168 lb (76.204 kg)  02/11/15 167 lb 12 oz (76.091 kg)  01/20/15 174 lb (78.926 kg)    Physical Exam  Constitutional: She appears well-developed. No distress.  HENT:  Head: Normocephalic.  Right Ear: External ear normal.  Left Ear: External ear normal.  Nose: Nose normal.  Mouth/Throat: Oropharynx is clear and moist.  Eyes: Conjunctivae are normal. Pupils are equal, round, and reactive to light. Right eye exhibits no discharge. Left eye exhibits no discharge.  Neck: Normal range of motion. Neck supple. No JVD present. No tracheal deviation present. No thyromegaly present.  Cardiovascular: Normal rate, regular rhythm and normal heart sounds.   Pulmonary/Chest: No stridor. No respiratory distress.  She has no wheezes.  Abdominal: Soft. Bowel sounds are normal. She exhibits no distension and no mass. There is no tenderness. There is no rebound and no guarding.  Musculoskeletal: She exhibits no edema or tenderness.  Lymphadenopathy:    She has no cervical adenopathy.  Neurological: She displays normal reflexes. No cranial nerve deficit. She exhibits normal muscle tone. Coordination normal.  Skin: No rash noted. No erythema.  Psychiatric: She has a normal mood and affect. Her behavior is  normal. Judgment and thought content normal.  R maxillary sinus is tender  Lab Results  Component Value Date   WBC 4.9 01/20/2015   HGB 14.2 01/20/2015   HCT 40.8 01/20/2015   PLT 372.0 01/20/2015   GLUCOSE 90 01/20/2015   CHOL 169 01/20/2015   TRIG 95.0 01/20/2015   HDL 58.40 01/20/2015   LDLCALC 92 01/20/2015   ALT 23 01/20/2015   AST 21 01/20/2015   NA 140 01/20/2015   K 4.0 01/20/2015   CL 106 01/20/2015   CREATININE 0.73 01/20/2015   BUN 22 01/20/2015   CO2 28 01/20/2015   TSH 2.46 01/20/2015    Mr Brain W Wo Contrast  07/07/2013   *RADIOLOGY REPORT*  Clinical Data: Headache.  Facial pain.  Right eye pain  BUN and creatinine were obtained on site at [facility] at [address] Results:  BUN 13 mg/dL,  Creatinine 0.8 mg/dL.  MRI HEAD WITHOUT AND WITH CONTRAST  Technique:  Multiplanar, multiecho pulse sequences of the brain and surrounding structures were obtained according to standard protocol without and with intravenous contrast  Contrast: 78mL MULTIHANCE GADOBENATE DIMEGLUMINE 529 MG/ML IV SOLN  Comparison: None.  Findings: Ventricle size is normal.  Negative for acute infarct. Small hyperintensities in the frontal white matter bilaterally appear chronic.  No cortical infarct.  Brainstem and cerebellum are normal.  No intracranial mass lesion.  High resolution images of the posterior fossa were obtained. Seventh and eighth cranial nerves are normal.  Negative for vestibular schwannoma.  The cisternal segment of the trigeminal nerve is normal.  No vascular impingement of the trigeminal nerve. The cavernous sinus is normal without mass lesion.  No lesion in the skull base is identified.  Brainstem and temporal bone appears normal.  Mastoid sinus is clear bilaterally.  IMPRESSION: No cause for headache or facial pain is identified.  No acute abnormality.  Mild chronic changes in the white matter likely related to chronic microvascular ischemia or migraine headache.    Original Report Authenticated By: Carl Best, M.D.    Assessment & Plan:   Diagnoses and all orders for this visit:  Pain in right eye  Acute recurrent maxillary sinusitis -     cefUROXime (CEFTIN) 250 MG tablet; Take 1 tablet (250 mg total) by mouth 2 (two) times daily with a meal.  Other orders -     etodolac (LODINE) 500 MG tablet; TAKE 1 TABLET BY MOUTH TWICE DAILY AS NEEDED WITH FOOD -     acyclovir (ZOVIRAX) 800 MG tablet; Take 1 tablet (800 mg total) by mouth 5 (five) times daily.   I have discontinued Ms. Hinds's ibuprofen. I am also having her maintain her VITAMIN E PO, aspirin, mupirocin ointment, diclofenac, ALPRAZolam, atorvastatin, prednisoLONE acetate, traMADol, metroNIDAZOLE, cefUROXime, etodolac, and acyclovir.  Meds ordered this encounter  Medications  . DISCONTD: etodolac (LODINE) 500 MG tablet    Sig: TAKE 1 TABLET BY MOUTH TWICE DAILY AS NEEDED WITH FOOD    Refill:  1  .  metroNIDAZOLE (METROCREAM) 0.75 % cream    Sig: APPLY TO AFFECTED AREA ON FACE EVERY DAY    Refill:  3  . cefUROXime (CEFTIN) 250 MG tablet    Sig: Take 1 tablet (250 mg total) by mouth 2 (two) times daily with a meal.    Dispense:  20 tablet    Refill:  1  . etodolac (LODINE) 500 MG tablet    Sig: TAKE 1 TABLET BY MOUTH TWICE DAILY AS NEEDED WITH FOOD    Dispense:  180 tablet    Refill:  2  . acyclovir (ZOVIRAX) 800 MG tablet    Sig: Take 1 tablet (800 mg total) by mouth 5 (five) times daily.    Dispense:  50 tablet    Refill:  1     Follow-up: Return in about 6 months (around 03/19/2016) for Wellness Exam.  Walker Kehr, MD

## 2015-09-19 NOTE — Addendum Note (Signed)
Addended by: Cresenciano Lick on: 09/19/2015 03:42 PM   Modules accepted: Orders

## 2016-01-23 ENCOUNTER — Encounter: Payer: Self-pay | Admitting: Internal Medicine

## 2016-01-23 ENCOUNTER — Ambulatory Visit (INDEPENDENT_AMBULATORY_CARE_PROVIDER_SITE_OTHER): Payer: Medicare Other | Admitting: Internal Medicine

## 2016-01-23 ENCOUNTER — Other Ambulatory Visit (INDEPENDENT_AMBULATORY_CARE_PROVIDER_SITE_OTHER): Payer: Medicare Other

## 2016-01-23 ENCOUNTER — Encounter: Payer: Medicare Other | Admitting: Internal Medicine

## 2016-01-23 VITALS — BP 130/85 | HR 76 | Ht 64.0 in | Wt 169.0 lb

## 2016-01-23 DIAGNOSIS — Z0000 Encounter for general adult medical examination without abnormal findings: Secondary | ICD-10-CM | POA: Diagnosis not present

## 2016-01-23 DIAGNOSIS — E785 Hyperlipidemia, unspecified: Secondary | ICD-10-CM | POA: Diagnosis not present

## 2016-01-23 DIAGNOSIS — Z23 Encounter for immunization: Secondary | ICD-10-CM | POA: Diagnosis not present

## 2016-01-23 LAB — CBC WITH DIFFERENTIAL/PLATELET
Basophils Absolute: 0.1 10*3/uL (ref 0.0–0.1)
Basophils Relative: 1.3 % (ref 0.0–3.0)
Eosinophils Absolute: 0.2 10*3/uL (ref 0.0–0.7)
Eosinophils Relative: 4.9 % (ref 0.0–5.0)
HCT: 42.6 % (ref 36.0–46.0)
Hemoglobin: 14.3 g/dL (ref 12.0–15.0)
Lymphocytes Relative: 27.2 % (ref 12.0–46.0)
Lymphs Abs: 1.1 10*3/uL (ref 0.7–4.0)
MCHC: 33.6 g/dL (ref 30.0–36.0)
MCV: 96 fl (ref 78.0–100.0)
Monocytes Absolute: 0.5 10*3/uL (ref 0.1–1.0)
Monocytes Relative: 11.1 % (ref 3.0–12.0)
Neutro Abs: 2.3 10*3/uL (ref 1.4–7.7)
Neutrophils Relative %: 55.5 % (ref 43.0–77.0)
Platelets: 372 10*3/uL (ref 150.0–400.0)
RBC: 4.44 Mil/uL (ref 3.87–5.11)
RDW: 13 % (ref 11.5–15.5)
WBC: 4.2 10*3/uL (ref 4.0–10.5)

## 2016-01-23 LAB — LIPID PANEL
Cholesterol: 173 mg/dL (ref 0–200)
HDL: 63.7 mg/dL (ref >39.00)
LDL Cholesterol: 92 mg/dL (ref 0–99)
NonHDL: 108.9
Total CHOL/HDL Ratio: 3
Triglycerides: 83 mg/dL (ref 0.0–149.0)
VLDL: 16.6 mg/dL (ref 0.0–40.0)

## 2016-01-23 LAB — URINALYSIS
Hgb urine dipstick: NEGATIVE
Ketones, ur: NEGATIVE
Leukocytes, UA: NEGATIVE
Nitrite: NEGATIVE
Specific Gravity, Urine: 1.025 (ref 1.000–1.030)
Total Protein, Urine: NEGATIVE
Urine Glucose: NEGATIVE
Urobilinogen, UA: 0.2 (ref 0.0–1.0)
pH: 5.5 (ref 5.0–8.0)

## 2016-01-23 LAB — HEPATIC FUNCTION PANEL
ALT: 14 U/L (ref 0–35)
AST: 19 U/L (ref 0–37)
Albumin: 4.4 g/dL (ref 3.5–5.2)
Alkaline Phosphatase: 60 U/L (ref 39–117)
Bilirubin, Direct: 0.2 mg/dL (ref 0.0–0.3)
Total Bilirubin: 0.6 mg/dL (ref 0.2–1.2)
Total Protein: 6.8 g/dL (ref 6.0–8.3)

## 2016-01-23 LAB — BASIC METABOLIC PANEL
BUN: 22 mg/dL (ref 6–23)
CO2: 29 mEq/L (ref 19–32)
Calcium: 9.5 mg/dL (ref 8.4–10.5)
Chloride: 106 mEq/L (ref 96–112)
Creatinine, Ser: 0.69 mg/dL (ref 0.40–1.20)
GFR: 90.31 mL/min (ref >60.00)
Glucose, Bld: 94 mg/dL (ref 70–99)
Potassium: 4.2 mEq/L (ref 3.5–5.1)
Sodium: 141 mEq/L (ref 135–145)

## 2016-01-23 LAB — TSH: TSH: 1.94 u[IU]/mL (ref 0.35–4.50)

## 2016-01-23 MED ORDER — ETODOLAC 500 MG PO TABS: ORAL_TABLET | ORAL | Status: DC

## 2016-01-23 MED ORDER — ALPRAZOLAM 0.25 MG PO TABS: 0.2500 mg | ORAL_TABLET | Freq: Two times a day (BID) | ORAL | Status: DC | PRN

## 2016-01-23 MED ORDER — ATORVASTATIN CALCIUM 20 MG PO TABS: 20.0000 mg | ORAL_TABLET | Freq: Every day | ORAL | Status: DC

## 2016-01-23 MED ORDER — ACYCLOVIR 800 MG PO TABS: 800.0000 mg | ORAL_TABLET | Freq: Every day | ORAL | Status: DC

## 2016-01-23 MED ORDER — MUPIROCIN 2 % EX OINT: 1.0000 "application " | TOPICAL_OINTMENT | Freq: Every day | CUTANEOUS | Status: DC

## 2016-01-23 MED ORDER — PREDNISOLONE ACETATE 1 % OP SUSP: 1.0000 [drp] | Freq: Four times a day (QID) | OPHTHALMIC | Status: DC

## 2016-01-23 NOTE — Patient Instructions (Signed)
Preventive Care for Adults, Female A healthy lifestyle and preventive care can promote health and wellness. Preventive health guidelines for women include the following key practices.  A routine yearly physical is a good way to check with your health care provider about your health and preventive screening. It is a chance to share any concerns and updates on your health and to receive a thorough exam.  Visit your dentist for a routine exam and preventive care every 6 months. Brush your teeth twice a day and floss once a day. Good oral hygiene prevents tooth decay and gum disease.  The frequency of eye exams is based on your age, health, family medical history, use of contact lenses, and other factors. Follow your health care provider's recommendations for frequency of eye exams.  Eat a healthy diet. Foods like vegetables, fruits, whole grains, low-fat dairy products, and lean protein foods contain the nutrients you need without too many calories. Decrease your intake of foods high in solid fats, added sugars, and salt. Eat the right amount of calories for you.Get information about a proper diet from your health care provider, if necessary.  Regular physical exercise is one of the most important things you can do for your health. Most adults should get at least 150 minutes of moderate-intensity exercise (any activity that increases your heart rate and causes you to sweat) each week. In addition, most adults need muscle-strengthening exercises on 2 or more days a week.  Maintain a healthy weight. The body mass index (BMI) is a screening tool to identify possible weight problems. It provides an estimate of body fat based on height and weight. Your health care provider can find your BMI and can help you achieve or maintain a healthy weight.For adults 20 years and older:  A BMI below 18.5 is considered underweight.  A BMI of 18.5 to 24.9 is normal.  A BMI of 25 to 29.9 is considered overweight.  A  BMI of 30 and above is considered obese.  Maintain normal blood lipids and cholesterol levels by exercising and minimizing your intake of saturated fat. Eat a balanced diet with plenty of fruit and vegetables. Blood tests for lipids and cholesterol should begin at age 45 and be repeated every 5 years. If your lipid or cholesterol levels are high, you are over 50, or you are at high risk for heart disease, you may need your cholesterol levels checked more frequently.Ongoing high lipid and cholesterol levels should be treated with medicines if diet and exercise are not working.  If you smoke, find out from your health care provider how to quit. If you do not use tobacco, do not start.  Lung cancer screening is recommended for adults aged 45-80 years who are at high risk for developing lung cancer because of a history of smoking. A yearly low-dose CT scan of the lungs is recommended for people who have at least a 30-pack-year history of smoking and are a current smoker or have quit within the past 15 years. A pack year of smoking is smoking an average of 1 pack of cigarettes a day for 1 year (for example: 1 pack a day for 30 years or 2 packs a day for 15 years). Yearly screening should continue until the smoker has stopped smoking for at least 15 years. Yearly screening should be stopped for people who develop a health problem that would prevent them from having lung cancer treatment.  If you are pregnant, do not drink alcohol. If you are  breastfeeding, be very cautious about drinking alcohol. If you are not pregnant and choose to drink alcohol, do not have more than 1 drink per day. One drink is considered to be 12 ounces (355 mL) of beer, 5 ounces (148 mL) of wine, or 1.5 ounces (44 mL) of liquor.  Avoid use of street drugs. Do not share needles with anyone. Ask for help if you need support or instructions about stopping the use of drugs.  High blood pressure causes heart disease and increases the risk  of stroke. Your blood pressure should be checked at least every 1 to 2 years. Ongoing high blood pressure should be treated with medicines if weight loss and exercise do not work.  If you are 55-79 years old, ask your health care provider if you should take aspirin to prevent strokes.  Diabetes screening is done by taking a blood sample to check your blood glucose level after you have not eaten for a certain period of time (fasting). If you are not overweight and you do not have risk factors for diabetes, you should be screened once every 3 years starting at age 45. If you are overweight or obese and you are 40-70 years of age, you should be screened for diabetes every year as part of your cardiovascular risk assessment.  Breast cancer screening is essential preventive care for women. You should practice "breast self-awareness." This means understanding the normal appearance and feel of your breasts and may include breast self-examination. Any changes detected, no matter how small, should be reported to a health care provider. Women in their 20s and 30s should have a clinical breast exam (CBE) by a health care provider as part of a regular health exam every 1 to 3 years. After age 40, women should have a CBE every year. Starting at age 40, women should consider having a mammogram (breast X-ray test) every year. Women who have a family history of breast cancer should talk to their health care provider about genetic screening. Women at a high risk of breast cancer should talk to their health care providers about having an MRI and a mammogram every year.  Breast cancer gene (BRCA)-related cancer risk assessment is recommended for women who have family members with BRCA-related cancers. BRCA-related cancers include breast, ovarian, tubal, and peritoneal cancers. Having family members with these cancers may be associated with an increased risk for harmful changes (mutations) in the breast cancer genes BRCA1 and  BRCA2. Results of the assessment will determine the need for genetic counseling and BRCA1 and BRCA2 testing.  Your health care provider may recommend that you be screened regularly for cancer of the pelvic organs (ovaries, uterus, and vagina). This screening involves a pelvic examination, including checking for microscopic changes to the surface of your cervix (Pap test). You may be encouraged to have this screening done every 3 years, beginning at age 21.  For women ages 30-65, health care providers may recommend pelvic exams and Pap testing every 3 years, or they may recommend the Pap and pelvic exam, combined with testing for human papilloma virus (HPV), every 5 years. Some types of HPV increase your risk of cervical cancer. Testing for HPV may also be done on women of any age with unclear Pap test results.  Other health care providers may not recommend any screening for nonpregnant women who are considered low risk for pelvic cancer and who do not have symptoms. Ask your health care provider if a screening pelvic exam is right for   you.  If you have had past treatment for cervical cancer or a condition that could lead to cancer, you need Pap tests and screening for cancer for at least 20 years after your treatment. If Pap tests have been discontinued, your risk factors (such as having a new sexual partner) need to be reassessed to determine if screening should resume. Some women have medical problems that increase the chance of getting cervical cancer. In these cases, your health care provider may recommend more frequent screening and Pap tests.  Colorectal cancer can be detected and often prevented. Most routine colorectal cancer screening begins at the age of 50 years and continues through age 75 years. However, your health care provider may recommend screening at an earlier age if you have risk factors for colon cancer. On a yearly basis, your health care provider may provide home test kits to check  for hidden blood in the stool. Use of a small camera at the end of a tube, to directly examine the colon (sigmoidoscopy or colonoscopy), can detect the earliest forms of colorectal cancer. Talk to your health care provider about this at age 50, when routine screening begins. Direct exam of the colon should be repeated every 5-10 years through age 75 years, unless early forms of precancerous polyps or small growths are found.  People who are at an increased risk for hepatitis B should be screened for this virus. You are considered at high risk for hepatitis B if:  You were born in a country where hepatitis B occurs often. Talk with your health care provider about which countries are considered high risk.  Your parents were born in a high-risk country and you have not received a shot to protect against hepatitis B (hepatitis B vaccine).  You have HIV or AIDS.  You use needles to inject street drugs.  You live with, or have sex with, someone who has hepatitis B.  You get hemodialysis treatment.  You take certain medicines for conditions like cancer, organ transplantation, and autoimmune conditions.  Hepatitis C blood testing is recommended for all people born from 1945 through 1965 and any individual with known risks for hepatitis C.  Practice safe sex. Use condoms and avoid high-risk sexual practices to reduce the spread of sexually transmitted infections (STIs). STIs include gonorrhea, chlamydia, syphilis, trichomonas, herpes, HPV, and human immunodeficiency virus (HIV). Herpes, HIV, and HPV are viral illnesses that have no cure. They can result in disability, cancer, and death.  You should be screened for sexually transmitted illnesses (STIs) including gonorrhea and chlamydia if:  You are sexually active and are younger than 24 years.  You are older than 24 years and your health care provider tells you that you are at risk for this type of infection.  Your sexual activity has changed  since you were last screened and you are at an increased risk for chlamydia or gonorrhea. Ask your health care provider if you are at risk.  If you are at risk of being infected with HIV, it is recommended that you take a prescription medicine daily to prevent HIV infection. This is called preexposure prophylaxis (PrEP). You are considered at risk if:  You are sexually active and do not regularly use condoms or know the HIV status of your partner(s).  You take drugs by injection.  You are sexually active with a partner who has HIV.  Talk with your health care provider about whether you are at high risk of being infected with HIV. If   you choose to begin PrEP, you should first be tested for HIV. You should then be tested every 3 months for as long as you are taking PrEP.  Osteoporosis is a disease in which the bones lose minerals and strength with aging. This can result in serious bone fractures or breaks. The risk of osteoporosis can be identified using a bone density scan. Women ages 67 years and over and women at risk for fractures or osteoporosis should discuss screening with their health care providers. Ask your health care provider whether you should take a calcium supplement or vitamin D to reduce the rate of osteoporosis.  Menopause can be associated with physical symptoms and risks. Hormone replacement therapy is available to decrease symptoms and risks. You should talk to your health care provider about whether hormone replacement therapy is right for you.  Use sunscreen. Apply sunscreen liberally and repeatedly throughout the day. You should seek shade when your shadow is shorter than you. Protect yourself by wearing long sleeves, pants, a wide-brimmed hat, and sunglasses year round, whenever you are outdoors.  Once a month, do a whole body skin exam, using a mirror to look at the skin on your back. Tell your health care provider of new moles, moles that have irregular borders, moles that  are larger than a pencil eraser, or moles that have changed in shape or color.  Stay current with required vaccines (immunizations).  Influenza vaccine. All adults should be immunized every year.  Tetanus, diphtheria, and acellular pertussis (Td, Tdap) vaccine. Pregnant women should receive 1 dose of Tdap vaccine during each pregnancy. The dose should be obtained regardless of the length of time since the last dose. Immunization is preferred during the 27th-36th week of gestation. An adult who has not previously received Tdap or who does not know her vaccine status should receive 1 dose of Tdap. This initial dose should be followed by tetanus and diphtheria toxoids (Td) booster doses every 10 years. Adults with an unknown or incomplete history of completing a 3-dose immunization series with Td-containing vaccines should begin or complete a primary immunization series including a Tdap dose. Adults should receive a Td booster every 10 years.  Varicella vaccine. An adult without evidence of immunity to varicella should receive 2 doses or a second dose if she has previously received 1 dose. Pregnant females who do not have evidence of immunity should receive the first dose after pregnancy. This first dose should be obtained before leaving the health care facility. The second dose should be obtained 4-8 weeks after the first dose.  Human papillomavirus (HPV) vaccine. Females aged 13-26 years who have not received the vaccine previously should obtain the 3-dose series. The vaccine is not recommended for use in pregnant females. However, pregnancy testing is not needed before receiving a dose. If a female is found to be pregnant after receiving a dose, no treatment is needed. In that case, the remaining doses should be delayed until after the pregnancy. Immunization is recommended for any person with an immunocompromised condition through the age of 61 years if she did not get any or all doses earlier. During the  3-dose series, the second dose should be obtained 4-8 weeks after the first dose. The third dose should be obtained 24 weeks after the first dose and 16 weeks after the second dose.  Zoster vaccine. One dose is recommended for adults aged 30 years or older unless certain conditions are present.  Measles, mumps, and rubella (MMR) vaccine. Adults born  before 1957 generally are considered immune to measles and mumps. Adults born in 1957 or later should have 1 or more doses of MMR vaccine unless there is a contraindication to the vaccine or there is laboratory evidence of immunity to each of the three diseases. A routine second dose of MMR vaccine should be obtained at least 28 days after the first dose for students attending postsecondary schools, health care workers, or international travelers. People who received inactivated measles vaccine or an unknown type of measles vaccine during 1963-1967 should receive 2 doses of MMR vaccine. People who received inactivated mumps vaccine or an unknown type of mumps vaccine before 1979 and are at high risk for mumps infection should consider immunization with 2 doses of MMR vaccine. For females of childbearing age, rubella immunity should be determined. If there is no evidence of immunity, females who are not pregnant should be vaccinated. If there is no evidence of immunity, females who are pregnant should delay immunization until after pregnancy. Unvaccinated health care workers born before 1957 who lack laboratory evidence of measles, mumps, or rubella immunity or laboratory confirmation of disease should consider measles and mumps immunization with 2 doses of MMR vaccine or rubella immunization with 1 dose of MMR vaccine.  Pneumococcal 13-valent conjugate (PCV13) vaccine. When indicated, a person who is uncertain of his immunization history and has no record of immunization should receive the PCV13 vaccine. All adults 65 years of age and older should receive this  vaccine. An adult aged 19 years or older who has certain medical conditions and has not been previously immunized should receive 1 dose of PCV13 vaccine. This PCV13 should be followed with a dose of pneumococcal polysaccharide (PPSV23) vaccine. Adults who are at high risk for pneumococcal disease should obtain the PPSV23 vaccine at least 8 weeks after the dose of PCV13 vaccine. Adults older than 67 years of age who have normal immune system function should obtain the PPSV23 vaccine dose at least 1 year after the dose of PCV13 vaccine.  Pneumococcal polysaccharide (PPSV23) vaccine. When PCV13 is also indicated, PCV13 should be obtained first. All adults aged 65 years and older should be immunized. An adult younger than age 65 years who has certain medical conditions should be immunized. Any person who resides in a nursing home or long-term care facility should be immunized. An adult smoker should be immunized. People with an immunocompromised condition and certain other conditions should receive both PCV13 and PPSV23 vaccines. People with human immunodeficiency virus (HIV) infection should be immunized as soon as possible after diagnosis. Immunization during chemotherapy or radiation therapy should be avoided. Routine use of PPSV23 vaccine is not recommended for American Indians, [place] Natives, or people younger than 65 years unless there are medical conditions that require PPSV23 vaccine. When indicated, people who have unknown immunization and have no record of immunization should receive PPSV23 vaccine. One-time revaccination 5 years after the first dose of PPSV23 is recommended for people aged 19-64 years who have chronic kidney failure, nephrotic syndrome, asplenia, or immunocompromised conditions. People who received 1-2 doses of PPSV23 before age 65 years should receive another dose of PPSV23 vaccine at age 65 years or later if at least 5 years have passed since the previous dose. Doses of PPSV23 are not  needed for people immunized with PPSV23 at or after age 65 years.  Meningococcal vaccine. Adults with asplenia or persistent complement component deficiencies should receive 2 doses of quadrivalent meningococcal conjugate (MenACWY-D) vaccine. The doses should be obtained   at least 2 months apart. Microbiologists working with certain meningococcal bacteria, [place] recruits, people at risk during an outbreak, and people who travel to or live in countries with a high rate of meningitis should be immunized. A first-year college student up through age 34 years who is living in a residence hall should receive a dose if she did not receive a dose on or after her 16th birthday. Adults who have certain high-risk conditions should receive one or more doses of vaccine.  Hepatitis A vaccine. Adults who wish to be protected from this disease, have certain high-risk conditions, work with hepatitis A-infected animals, work in hepatitis A research labs, or travel to or work in countries with a high rate of hepatitis A should be immunized. Adults who were previously unvaccinated and who anticipate close contact with an international adoptee during the first 60 days after arrival in the [place] from a country with a high rate of hepatitis A should be immunized.  Hepatitis B vaccine. Adults who wish to be protected from this disease, have certain high-risk conditions, may be exposed to blood or other infectious body fluids, are household contacts or sex partners of hepatitis B positive people, are clients or workers in certain care facilities, or travel to or work in countries with a high rate of hepatitis B should be immunized.  Haemophilus influenzae type b (Hib) vaccine. A previously unvaccinated person with asplenia or sickle cell disease or having a scheduled splenectomy should receive 1 dose of Hib vaccine. Regardless of previous immunization, a recipient of a hematopoietic stem cell transplant should receive a  3-dose series 6-12 months after her successful transplant. Hib vaccine is not recommended for adults with HIV infection. Preventive Services / Frequency Ages 35 to 4 years  Blood pressure check.** / Every 3-5 years.  Lipid and cholesterol check.** / Every 5 years beginning at age 60.  Clinical breast exam.** / Every 3 years for women in their 71s and 10s.  BRCA-related cancer risk assessment.** / For women who have family members with a BRCA-related cancer (breast, ovarian, tubal, or peritoneal cancers).  Pap test.** / Every 2 years from ages 76 through 26. Every 3 years starting at age 61 through age 76 or 93 with a history of 3 consecutive normal Pap tests.  HPV screening.** / Every 3 years from ages 37 through ages 60 to 51 with a history of 3 consecutive normal Pap tests.  Hepatitis C blood test.** / For any individual with known risks for hepatitis C.  Skin self-exam. / Monthly.  Influenza vaccine. / Every year.  Tetanus, diphtheria, and acellular pertussis (Tdap, Td) vaccine.** / Consult your health care provider. Pregnant women should receive 1 dose of Tdap vaccine during each pregnancy. 1 dose of Td every 10 years.  Varicella vaccine.** / Consult your health care provider. Pregnant females who do not have evidence of immunity should receive the first dose after pregnancy.  HPV vaccine. / 3 doses over 6 months, if 93 and younger. The vaccine is not recommended for use in pregnant females. However, pregnancy testing is not needed before receiving a dose.  Measles, mumps, rubella (MMR) vaccine.** / You need at least 1 dose of MMR if you were born in 1957 or later. You may also need a 2nd dose. For females of childbearing age, rubella immunity should be determined. If there is no evidence of immunity, females who are not pregnant should be vaccinated. If there is no evidence of immunity, females who are  pregnant should delay immunization until after pregnancy.  Pneumococcal  13-valent conjugate (PCV13) vaccine.** / Consult your health care provider.  Pneumococcal polysaccharide (PPSV23) vaccine.** / 1 to 2 doses if you smoke cigarettes or if you have certain conditions.  Meningococcal vaccine.** / 1 dose if you are age 68 to 8 years and a Market researcher living in a residence hall, or have one of several medical conditions, you need to get vaccinated against meningococcal disease. You may also need additional booster doses.  Hepatitis A vaccine.** / Consult your health care provider.  Hepatitis B vaccine.** / Consult your health care provider.  Haemophilus influenzae type b (Hib) vaccine.** / Consult your health care provider. Ages 7 to 53 years  Blood pressure check.** / Every year.  Lipid and cholesterol check.** / Every 5 years beginning at age 25 years.  Lung cancer screening. / Every year if you are aged 11-80 years and have a 30-pack-year history of smoking and currently smoke or have quit within the past 15 years. Yearly screening is stopped once you have quit smoking for at least 15 years or develop a health problem that would prevent you from having lung cancer treatment.  Clinical breast exam.** / Every year after age 48 years.  BRCA-related cancer risk assessment.** / For women who have family members with a BRCA-related cancer (breast, ovarian, tubal, or peritoneal cancers).  Mammogram.** / Every year beginning at age 41 years and continuing for as long as you are in good health. Consult with your health care provider.  Pap test.** / Every 3 years starting at age 65 years through age 37 or 70 years with a history of 3 consecutive normal Pap tests.  HPV screening.** / Every 3 years from ages 72 years through ages 60 to 40 years with a history of 3 consecutive normal Pap tests.  Fecal occult blood test (FOBT) of stool. / Every year beginning at age 21 years and continuing until age 5 years. You may not need to do this test if you get  a colonoscopy every 10 years.  Flexible sigmoidoscopy or colonoscopy.** / Every 5 years for a flexible sigmoidoscopy or every 10 years for a colonoscopy beginning at age 35 years and continuing until age 48 years.  Hepatitis C blood test.** / For all people born from [birth year] through 1965 and any individual with known risks for hepatitis C.  Skin self-exam. / Monthly.  Influenza vaccine. / Every year.  Tetanus, diphtheria, and acellular pertussis (Tdap/Td) vaccine.** / Consult your health care provider. Pregnant women should receive 1 dose of Tdap vaccine during each pregnancy. 1 dose of Td every 10 years.  Varicella vaccine.** / Consult your health care provider. Pregnant females who do not have evidence of immunity should receive the first dose after pregnancy.  Zoster vaccine.** / 1 dose for adults aged 30 years or older.  Measles, mumps, rubella (MMR) vaccine.** / You need at least 1 dose of MMR if you were born in 1957 or later. You may also need a second dose. For females of childbearing age, rubella immunity should be determined. If there is no evidence of immunity, females who are not pregnant should be vaccinated. If there is no evidence of immunity, females who are pregnant should delay immunization until after pregnancy.  Pneumococcal 13-valent conjugate (PCV13) vaccine.** / Consult your health care provider.  Pneumococcal polysaccharide (PPSV23) vaccine.** / 1 to 2 doses if you smoke cigarettes or if you have certain conditions.  Meningococcal vaccine.** /  Consult your health care provider.  Hepatitis A vaccine.** / Consult your health care provider.  Hepatitis B vaccine.** / Consult your health care provider.  Haemophilus influenzae type b (Hib) vaccine.** / Consult your health care provider. Ages 64 years and over  Blood pressure check.** / Every year.  Lipid and cholesterol check.** / Every 5 years beginning at age 23 years.  Lung cancer screening. / Every year if you  are aged 16-80 years and have a 30-pack-year history of smoking and currently smoke or have quit within the past 15 years. Yearly screening is stopped once you have quit smoking for at least 15 years or develop a health problem that would prevent you from having lung cancer treatment.  Clinical breast exam.** / Every year after age 74 years.  BRCA-related cancer risk assessment.** / For women who have family members with a BRCA-related cancer (breast, ovarian, tubal, or peritoneal cancers).  Mammogram.** / Every year beginning at age 44 years and continuing for as long as you are in good health. Consult with your health care provider.  Pap test.** / Every 3 years starting at age 58 years through age 22 or 39 years with 3 consecutive normal Pap tests. Testing can be stopped between 65 and 70 years with 3 consecutive normal Pap tests and no abnormal Pap or HPV tests in the past 10 years.  HPV screening.** / Every 3 years from ages 64 years through ages 70 or 61 years with a history of 3 consecutive normal Pap tests. Testing can be stopped between 65 and 70 years with 3 consecutive normal Pap tests and no abnormal Pap or HPV tests in the past 10 years.  Fecal occult blood test (FOBT) of stool. / Every year beginning at age 40 years and continuing until age 27 years. You may not need to do this test if you get a colonoscopy every 10 years.  Flexible sigmoidoscopy or colonoscopy.** / Every 5 years for a flexible sigmoidoscopy or every 10 years for a colonoscopy beginning at age 7 years and continuing until age 32 years.  Hepatitis C blood test.** / For all people born from [birth year] through 1967 and any individual with known risks for hepatitis C.  Osteoporosis screening.** / A one-time screening for women ages 30 years and over and women at risk for fractures or osteoporosis.  Skin self-exam. / Monthly.  Influenza vaccine. / Every year.  Tetanus, diphtheria, and acellular pertussis (Tdap/Td)  vaccine.** / 1 dose of Td every 10 years.  Varicella vaccine.** / Consult your health care provider.  Zoster vaccine.** / 1 dose for adults aged 35 years or older.  Pneumococcal 13-valent conjugate (PCV13) vaccine.** / Consult your health care provider.  Pneumococcal polysaccharide (PPSV23) vaccine.** / 1 dose for all adults aged 46 years and older.  Meningococcal vaccine.** / Consult your health care provider.  Hepatitis A vaccine.** / Consult your health care provider.  Hepatitis B vaccine.** / Consult your health care provider.  Haemophilus influenzae type b (Hib) vaccine.** / Consult your health care provider. ** Family history and personal history of risk and conditions may change your health care provider's recommendations.   This information is not intended to replace advice given to you by your health care provider. Make sure you discuss any questions you have with your health care provider.   Document Released: 01/22/2002 Document Revised: 12/17/2014 Document Reviewed: 04/23/2011 Elsevier Interactive Patient Education [employer].

## 2016-01-23 NOTE — Assessment & Plan Note (Signed)

## 2016-01-23 NOTE — Progress Notes (Signed)
Pre visit review using our clinic review tool, if applicable. No additional management support is needed unless otherwise documented below in the visit note. 

## 2016-01-23 NOTE — Progress Notes (Signed)
Subjective:  Patient ID: Anna Ellis, female    DOB: 06-10-1951  Age: 65 y.o. MRN: [id number]  CC: No chief complaint on file.   HPI Anna Ellis presents for a well exam. BP is ok at home. C/o OA pains  Outpatient Prescriptions Prior to Visit  Medication Sig Dispense Refill  . aspirin 81 MG chewable tablet Chew 81 mg by mouth daily.      . metroNIDAZOLE (METROCREAM) 0.75 % cream APPLY TO AFFECTED AREA ON FACE EVERY DAY  3  . VITAMIN E PO Take by mouth daily.     Marland Kitchen ALPRAZolam (XANAX) 0.25 MG tablet Take 1 tablet (0.25 mg total) by mouth 2 (two) times daily as needed for anxiety. 30 tablet 1  . etodolac (LODINE) 500 MG tablet TAKE 1 TABLET BY MOUTH TWICE DAILY AS NEEDED WITH FOOD 180 tablet 2  . mupirocin ointment (BACTROBAN) 2 % Place 1 application into the nose daily. 30 g 0  . prednisoLONE acetate (PRED FORTE) 1 % ophthalmic suspension Place 1 drop into the right eye 4 (four) times daily. Use prn 5 mL 1  . traMADol (ULTRAM) 50 MG tablet Take 0.5-1 tablets (25-50 mg total) by mouth every 6 (six) hours as needed for moderate pain or severe pain. (Patient not taking: Reported on 01/23/2016) 100 tablet 3  . acyclovir (ZOVIRAX) 800 MG tablet Take 1 tablet (800 mg total) by mouth 5 (five) times daily. 50 tablet 1  . atorvastatin (LIPITOR) 20 MG tablet Take 1 tablet (20 mg total) by mouth daily. 90 tablet 3  . cefUROXime (CEFTIN) 250 MG tablet Take 1 tablet (250 mg total) by mouth 2 (two) times daily with a meal. (Patient not taking: Reported on 01/23/2016) 20 tablet 1  . diclofenac (VOLTAREN) 75 MG EC tablet Take 1 tablet (75 mg total) by mouth 2 (two) times daily. (Patient not taking: Reported on 01/23/2016) 180 tablet 3   No facility-administered medications prior to visit.    ROS Review of Systems  Constitutional: Negative for chills, activity change, appetite change, fatigue and unexpected weight change.  HENT: Negative for congestion, mouth sores and sinus pressure.   Eyes:  Negative for visual disturbance.  Respiratory: Negative for cough and chest tightness.   Gastrointestinal: Negative for nausea and abdominal pain.  Genitourinary: Negative for frequency, difficulty urinating and vaginal pain.  Musculoskeletal: Negative for back pain and gait problem.  Skin: Negative for pallor and rash.  Neurological: Negative for dizziness, tremors, weakness, numbness and headaches.  Psychiatric/Behavioral: Negative for suicidal ideas, confusion and sleep disturbance. The patient is not nervous/anxious.     Objective:  BP 130/85 mmHg  Pulse 76  Ht 5\' 4"  (1.626 m)  Wt 169 lb (76.658 kg)  BMI 28.99 kg/m2  SpO2 97%  BP Readings from Last 3 Encounters:  01/23/16 130/85  09/19/15 148/100  02/11/15 140/98    Wt Readings from Last 3 Encounters:  01/23/16 169 lb (76.658 kg)  09/19/15 168 lb (76.204 kg)  02/11/15 167 lb 12 oz (76.091 kg)    Physical Exam  Constitutional: She appears well-developed. No distress.  HENT:  Head: Normocephalic.  Right Ear: External ear normal.  Left Ear: External ear normal.  Nose: Nose normal.  Mouth/Throat: Oropharynx is clear and moist.  Eyes: Conjunctivae are normal. Pupils are equal, round, and reactive to light. Right eye exhibits no discharge. Left eye exhibits no discharge.  Neck: Normal range of motion. Neck supple. No JVD present. No tracheal deviation present. No  thyromegaly present.  Cardiovascular: Normal rate, regular rhythm and normal heart sounds.   Pulmonary/Chest: No stridor. No respiratory distress. She has no wheezes.  Abdominal: Soft. Bowel sounds are normal. She exhibits no distension and no mass. There is no tenderness. There is no rebound and no guarding.  Musculoskeletal: She exhibits no edema or tenderness.  Lymphadenopathy:    She has no cervical adenopathy.  Neurological: She displays normal reflexes. No cranial nerve deficit. She exhibits normal muscle tone. Coordination normal.  Skin: No rash noted. No  erythema.  Psychiatric: She has a normal mood and affect. Her behavior is normal. Judgment and thought content normal.    Lab Results  Component Value Date   WBC 4.2 01/23/2016   HGB 14.3 01/23/2016   HCT 42.6 01/23/2016   PLT 372.0 01/23/2016   GLUCOSE 94 01/23/2016   CHOL 173 01/23/2016   TRIG 83.0 01/23/2016   HDL 63.70 01/23/2016   LDLCALC 92 01/23/2016   ALT 14 01/23/2016   AST 19 01/23/2016   NA 141 01/23/2016   K 4.2 01/23/2016   CL 106 01/23/2016   CREATININE 0.69 01/23/2016   BUN 22 01/23/2016   CO2 29 01/23/2016   TSH 1.94 01/23/2016    Mr Brain W Wo Contrast  07/07/2013  *RADIOLOGY REPORT* Clinical Data: Headache.  Facial pain.  Right eye pain BUN and creatinine were obtained on site at [facility] at [address] Results:  BUN 13 mg/dL,  Creatinine 0.8 mg/dL. MRI HEAD WITHOUT AND WITH CONTRAST Technique:  Multiplanar, multiecho pulse sequences of the brain and surrounding structures were obtained according to standard protocol without and with intravenous contrast Contrast: 29mL MULTIHANCE GADOBENATE DIMEGLUMINE 529 MG/ML IV SOLN Comparison: None. Findings: Ventricle size is normal.  Negative for acute infarct. Small hyperintensities in the frontal white matter bilaterally appear chronic.  No cortical infarct.  Brainstem and cerebellum are normal.  No intracranial mass lesion. High resolution images of the posterior fossa were obtained. Seventh and eighth cranial nerves are normal.  Negative for vestibular schwannoma.  The cisternal segment of the trigeminal nerve is normal.  No vascular impingement of the trigeminal nerve. The cavernous sinus is normal without mass lesion.  No lesion in the skull base is identified.  Brainstem and temporal bone appears normal.  Mastoid sinus is clear bilaterally. IMPRESSION: No cause for headache or facial pain is identified.  No acute abnormality.  Mild chronic changes in the white matter likely related to chronic microvascular  ischemia or migraine headache. Original Report Authenticated By: Carl Best, M.D.    Assessment & Plan:   Diagnoses and all orders for this visit:  Well adult exam -     Basic metabolic panel; Future -     Hepatic function panel; Future -     Urinalysis; Future -     TSH; Future -     Hepatitis C antibody; Future -     CBC with Differential/Platelet; Future -     Lipid panel; Future  Dyslipidemia -     Lipid panel; Future  Need for prophylactic vaccination against Streptococcus pneumoniae (pneumococcus) -     Pneumococcal conjugate vaccine 13-valent IM  Other orders -     atorvastatin (LIPITOR) 20 MG tablet; Take 1 tablet (20 mg total) by mouth daily. -     ALPRAZolam (XANAX) 0.25 MG tablet; Take 1 tablet (0.25 mg total) by mouth 2 (two) times daily as needed for anxiety. -     mupirocin ointment (  BACTROBAN) 2 %; Place 1 application into the nose daily. -     prednisoLONE acetate (PRED FORTE) 1 % ophthalmic suspension; Place 1 drop into the right eye 4 (four) times daily. Use prn -     Discontinue: etodolac (LODINE) 500 MG tablet; TAKE 1 TABLET BY MOUTH TWICE DAILY AS NEEDED WITH FOOD -     acyclovir (ZOVIRAX) 800 MG tablet; Take 1 tablet (800 mg total) by mouth 5 (five) times daily. -     etodolac (LODINE) 500 MG tablet; TAKE 1 TABLET BY MOUTH TWICE DAILY AS NEEDED WITH FOOD  I have discontinued Anna Ellis's diclofenac and cefUROXime. I am also having her maintain her VITAMIN E PO, aspirin, traMADol, metroNIDAZOLE, atorvastatin, ALPRAZolam, mupirocin ointment, prednisoLONE acetate, acyclovir, and etodolac.  Meds ordered this encounter  Medications  . atorvastatin (LIPITOR) 20 MG tablet    Sig: Take 1 tablet (20 mg total) by mouth daily.    Dispense:  90 tablet    Refill:  3  . ALPRAZolam (XANAX) 0.25 MG tablet    Sig: Take 1 tablet (0.25 mg total) by mouth 2 (two) times daily as needed for anxiety.    Dispense:  30 tablet    Refill:  1  . mupirocin ointment (BACTROBAN) 2 %     Sig: Place 1 application into the nose daily.    Dispense:  30 g    Refill:  0  . prednisoLONE acetate (PRED FORTE) 1 % ophthalmic suspension    Sig: Place 1 drop into the right eye 4 (four) times daily. Use prn    Dispense:  5 mL    Refill:  1  . DISCONTD: etodolac (LODINE) 500 MG tablet    Sig: TAKE 1 TABLET BY MOUTH TWICE DAILY AS NEEDED WITH FOOD    Dispense:  60 tablet    Refill:  2  . acyclovir (ZOVIRAX) 800 MG tablet    Sig: Take 1 tablet (800 mg total) by mouth 5 (five) times daily.    Dispense:  50 tablet    Refill:  1  . etodolac (LODINE) 500 MG tablet    Sig: TAKE 1 TABLET BY MOUTH TWICE DAILY AS NEEDED WITH FOOD    Dispense:  180 tablet    Refill:  2     Follow-up: Return in about 1 year (around 01/22/2017) for Wellness Exam.  Walker Kehr, MD

## 2016-01-24 LAB — HEPATITIS C ANTIBODY: HCV Ab: NEGATIVE

## 2016-02-20 LAB — HM MAMMOGRAPHY

## 2016-02-21 ENCOUNTER — Encounter: Payer: Self-pay | Admitting: Internal Medicine

## 2016-03-21 ENCOUNTER — Telehealth: Payer: Self-pay

## 2016-03-21 ENCOUNTER — Encounter: Payer: Self-pay | Admitting: Nurse Practitioner

## 2016-03-21 ENCOUNTER — Ambulatory Visit (INDEPENDENT_AMBULATORY_CARE_PROVIDER_SITE_OTHER): Payer: Medicare Other | Admitting: Nurse Practitioner

## 2016-03-21 DIAGNOSIS — H5711 Ocular pain, right eye: Secondary | ICD-10-CM

## 2016-03-21 DIAGNOSIS — E2839 Other primary ovarian failure: Secondary | ICD-10-CM

## 2016-03-21 MED ORDER — CEFUROXIME AXETIL 250 MG PO TABS: 250.0000 mg | ORAL_TABLET | Freq: Two times a day (BID) | ORAL | Status: DC

## 2016-03-21 MED ORDER — PREDNISONE 10 MG PO TABS: ORAL_TABLET | ORAL | Status: DC

## 2016-03-21 MED ORDER — METHYLPREDNISOLONE ACETATE 80 MG/ML IJ SUSP
80.0000 mg | Freq: Once | INTRAMUSCULAR | Status: AC
  Administered 2016-03-21: 80 mg via INTRAMUSCULAR

## 2016-03-21 NOTE — Progress Notes (Signed)
Pre visit review using our clinic review tool, if applicable. No additional management support is needed unless otherwise documented below in the visit note. 

## 2016-03-21 NOTE — Progress Notes (Signed)
Patient ID: Anna Ellis, female    DOB: Dec 22, 1950  Age: 65 y.o. MRN: [id number]  CC: Chronic Eye Issue   HPI Anna Ellis presents for CC of chronic right eye pain.   1) Patient has chronic right eye pain, pressure, burning symptoms. Patient has been to [facility] and seen several specialists about this and there is nothing definitive found at this time. She usually comes in for prednisone injections when she gets her worst she reports.  Last visit was with her PCP on 09/19/2015. She was given acyclovir 1 tablet 5 times daily and Ceftin in conjunction with the Depo-Medrol IM 80 mg. She reports this usually works well for her this combination.   There are 2 past notes in the care everywhere from 2014 by 2 specialists from [facility]. The most recent was from October 2014 and the diagnosis was atypical face pain with no apparent ocular cause.  Today she reports right temple, posterior eye pain, pressure, burning sensations. Patient reports there are no new symptoms that have appeared at this time.  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   She has past surgical history that includes TEARDUCT SURGERY (1955); Tonsillectomy and adenoidectomy (1968); Anterior cruciate ligament repair (2001); Breast lumpectomy (2002); Tubal ligation (1982); Wisdom tooth extraction (1967); Hernia repair (1970); Knee surgery (1986); Abdominal hysterectomy (1990); Cholecystectomy (1994); and Anterior cruciate ligament repair (2001).   Her family history includes Allergies in her son; Asthma in her son; Cancer in her mother; Heart disease in her father.She reports that she has never smoked. She has  never used smokeless tobacco. She reports that she does not drink alcohol or use illicit drugs.  Outpatient Prescriptions Prior to Visit  Medication Sig Dispense Refill  . aspirin 81 MG chewable tablet Chew 81 mg by mouth daily.      Marland Kitchen atorvastatin (LIPITOR) 20 MG tablet Take 1 tablet (20 mg total) by mouth daily. 90 tablet 3  . etodolac (LODINE) 500 MG tablet TAKE 1 TABLET BY MOUTH TWICE DAILY AS NEEDED WITH FOOD 180 tablet 2  . VITAMIN E PO Take by mouth daily.     Marland Kitchen acyclovir (ZOVIRAX) 800 MG tablet Take 1 tablet (800 mg total) by mouth 5 (five) times daily. 50 tablet 1  . metroNIDAZOLE (METROCREAM) 0.75 % cream Reported on 03/21/2016  3  . ALPRAZolam (XANAX) 0.25 MG tablet Take 1 tablet (0.25 mg total) by mouth 2 (two) times daily as needed for anxiety. (Patient not taking: Reported on 03/21/2016) 30 tablet 1  . mupirocin ointment (BACTROBAN) 2 % Place 1 application into the nose daily. (Patient not taking: Reported on 03/21/2016) 30 g 0  . prednisoLONE acetate (PRED FORTE) 1 % ophthalmic suspension Place 1 drop into the right eye 4 (four) times daily. Use prn (Patient not taking: Reported on 03/21/2016) 5 mL 1  . traMADol (ULTRAM) 50 MG tablet Take 0.5-1 tablets (25-50 mg total) by mouth every 6 (six) hours as needed for moderate pain or severe pain. (Patient not taking: Reported on 01/23/2016) 100 tablet 3   No facility-administered medications prior to visit.    ROS Review of Systems  Constitutional: Negative for fever, chills, diaphoresis and fatigue.  HENT: Positive for sinus pressure. Negative for congestion, dental problem, ear discharge, ear pain, facial swelling, nosebleeds,  postnasal drip, rhinorrhea, sneezing, sore throat, tinnitus, trouble swallowing and voice change.   Eyes: Positive for pain. Negative for photophobia, discharge, redness, itching and visual disturbance.  Respiratory: Negative for cough, chest tightness and wheezing.   Cardiovascular: Negative for chest pain and  palpitations.  Gastrointestinal: Negative for nausea, vomiting and diarrhea.  Musculoskeletal: Positive for myalgias.  Neurological: Positive for headaches. Negative for dizziness.    Objective:  BP 132/90 mmHg  Pulse 69  Temp(Src) 97.8 F (36.6 C) (Oral)  Ht 5\' 4"  (1.626 m)  Wt 170 lb (77.111 kg)  BMI 29.17 kg/m2  SpO2 98%  Physical Exam  Constitutional: She is oriented to person, place, and time. She appears well-developed and well-nourished. No distress.  HENT:  Head: Normocephalic and atraumatic.  Right Ear: External ear normal.  Left Ear: External ear normal.  Mouth/Throat: No oropharyngeal exudate.  TMs clear bilaterally  Eyes: Conjunctivae and EOM are normal. Pupils are equal, round, and reactive to light. Right eye exhibits no discharge. Left eye exhibits no discharge. No scleral icterus.  Right eye lateral socket at the temple is slightly more depressed on the right than the left  Cardiovascular: Normal rate and regular rhythm.   Pulmonary/Chest: Effort normal and breath sounds normal. No respiratory distress. She has no wheezes. She has no rales. She exhibits no tenderness.  Neurological: She is alert and oriented to person, place, and time. No cranial nerve deficit. She exhibits normal muscle tone. Coordination normal.  Skin: Skin is warm and dry. No rash noted. She is not diaphoretic.  Psychiatric: She has a normal mood and affect. Her behavior is normal. Judgment and thought content normal.   Assessment & Plan:   Anna Ellis was seen today for chronic eye issue.  Diagnoses and all orders for this visit:  Pain in right eye -     cefUROXime (CEFTIN) 250 MG tablet; Take 1 tablet (250 mg total) by mouth 2 (two) times daily with a meal. -     predniSONE (DELTASONE) 10 MG tablet; take 4 tabs PO a day x 3 days; then 3 tabs a day x 4 days; then 2 tabs a day x 4 days, then 1 tab a day x 6 days, then stop. Take pc. -     methylPREDNISolone acetate (DEPO-MEDROL) injection 80  mg; Inject 1 mL (80 mg total) into the muscle once.   I have discontinued Anna Ellis's traMADol, ALPRAZolam, mupirocin ointment, and prednisoLONE acetate. I am also having her start on cefUROXime and predniSONE. Additionally, I am having her maintain her VITAMIN E PO, aspirin, metroNIDAZOLE, atorvastatin, acyclovir, etodolac, and acyclovir. We administered methylPREDNISolone acetate.  Meds ordered this encounter  Medications  . acyclovir (ZOVIRAX) 800 MG tablet    Sig: Reported on 03/21/2016  . cefUROXime (CEFTIN) 250 MG tablet    Sig: Take 1 tablet (250 mg total) by mouth 2 (two) times daily with a meal.    Dispense:  20 tablet    Refill:  1    White Tabs- Red dye allergy    Order Specific Question:  Supervising Provider    Answer:  Deborra Medina L [[id number]]  . predniSONE (DELTASONE) 10 MG tablet    Sig: take 4 tabs PO a day x 3 days; then 3 tabs a day x 4 days; then 2 tabs a day x 4 days, then 1 tab a day x 6 days, then stop. Take pc.    Dispense:  38 tablet    Refill:  1  Order Specific Question:  Supervising Provider    Answer:  Crecencio Mc [[id number]]  . methylPREDNISolone acetate (DEPO-MEDROL) injection 80 mg    Sig:      Follow-up: Return if symptoms worsen or fail to improve.

## 2016-03-21 NOTE — Telephone Encounter (Signed)
Patient is request bone density scan.  Order is entered and scheduled.  Forwarding to PCP as and [place]

## 2016-03-21 NOTE — Patient Instructions (Signed)
Ceftin (white tabs) has 1 refill and so does the prednisone tablets.  Take if needed

## 2016-03-26 ENCOUNTER — Ambulatory Visit (INDEPENDENT_AMBULATORY_CARE_PROVIDER_SITE_OTHER)
Admission: RE | Admit: 2016-03-26 | Discharge: 2016-03-26 | Disposition: A | Discharge status: Home / Self Care | Payer: Medicare Other | Source: Ambulatory Visit | Attending: Internal Medicine | Admitting: Internal Medicine

## 2016-03-26 DIAGNOSIS — E2839 Other primary ovarian failure: Secondary | ICD-10-CM

## 2016-03-30 DIAGNOSIS — H5710 Ocular pain, unspecified eye: Secondary | ICD-10-CM | POA: Insufficient documentation

## 2016-03-30 NOTE — Assessment & Plan Note (Signed)
Established problem worsening Patient was given Depo-Medrol 80 mg IM Prednisone tapers in to pharmacy in case of need later Ceftin 250 mg twice daily 10 days sent to pharmacy Patient will follow-up with PCP in the future

## 2016-04-26 ENCOUNTER — Other Ambulatory Visit: Payer: Self-pay

## 2016-04-26 MED ORDER — ETODOLAC 500 MG PO TABS: ORAL_TABLET | ORAL | Status: DC

## 2016-08-09 ENCOUNTER — Other Ambulatory Visit: Payer: Self-pay | Admitting: Internal Medicine

## 2016-09-03 ENCOUNTER — Encounter: Payer: Self-pay | Admitting: Internal Medicine

## 2016-09-03 ENCOUNTER — Ambulatory Visit (INDEPENDENT_AMBULATORY_CARE_PROVIDER_SITE_OTHER): Payer: Medicare Other | Admitting: Internal Medicine

## 2016-09-03 DIAGNOSIS — R070 Pain in throat: Secondary | ICD-10-CM | POA: Insufficient documentation

## 2016-09-03 DIAGNOSIS — R030 Elevated blood-pressure reading, without diagnosis of hypertension: Secondary | ICD-10-CM

## 2016-09-03 DIAGNOSIS — K219 Gastro-esophageal reflux disease without esophagitis: Secondary | ICD-10-CM | POA: Diagnosis not present

## 2016-09-03 DIAGNOSIS — J020 Streptococcal pharyngitis: Secondary | ICD-10-CM | POA: Diagnosis not present

## 2016-09-03 DIAGNOSIS — J029 Acute pharyngitis, unspecified: Secondary | ICD-10-CM | POA: Insufficient documentation

## 2016-09-03 LAB — POCT RAPID STREP A (OFFICE): Rapid Strep A Screen: NEGATIVE

## 2016-09-03 MED ORDER — CEFUROXIME AXETIL 500 MG PO TABS: 500.0000 mg | ORAL_TABLET | Freq: Two times a day (BID) | ORAL | 0 refills | Status: DC

## 2016-09-03 MED ORDER — NYSTATIN 100000 UNIT/ML MT SUSP: 500000.0000 [IU] | Freq: Four times a day (QID) | OROMUCOSAL | 0 refills | Status: AC

## 2016-09-03 MED ORDER — OMEPRAZOLE 20 MG PO CPDR: 20.0000 mg | DELAYED_RELEASE_CAPSULE | Freq: Two times a day (BID) | ORAL | 3 refills | Status: DC

## 2016-09-03 NOTE — Progress Notes (Signed)
Subjective:  Patient ID: Anna Ellis, female    DOB: 1951-02-22  Age: 65 y.o. MRN: [id number]  CC: Sore Throat (Intermittnent x 2- 3 weeks) and Ear Pain (Left)   HPI Anna Ellis presents for ST, cough  Outpatient Medications Prior to Visit  Medication Sig Dispense Refill  . acyclovir (ZOVIRAX) 800 MG tablet Reported on 03/21/2016    . aspirin 81 MG chewable tablet Chew 81 mg by mouth daily.      Marland Kitchen atorvastatin (LIPITOR) 20 MG tablet Take 1 tablet by mouth  daily 90 tablet 1  . etodolac (LODINE) 500 MG tablet TAKE 1 TABLET BY MOUTH TWICE DAILY AS NEEDED WITH FOOD 180 tablet 2  . metroNIDAZOLE (METROCREAM) 0.75 % cream Reported on 03/21/2016  3  . VITAMIN E PO Take by mouth daily.     . cefUROXime (CEFTIN) 250 MG tablet Take 1 tablet (250 mg total) by mouth 2 (two) times daily with a meal. 20 tablet 1  . predniSONE (DELTASONE) 10 MG tablet take 4 tabs PO a day x 3 days; then 3 tabs a day x 4 days; then 2 tabs a day x 4 days, then 1 tab a day x 6 days, then stop. Take pc. 38 tablet 1  . acyclovir (ZOVIRAX) 800 MG tablet Take 1 tablet (800 mg total) by mouth 5 (five) times daily. 50 tablet 1   No facility-administered medications prior to visit.     ROS Review of Systems  Constitutional: Negative for activity change, appetite change, chills, fatigue and unexpected weight change.  HENT: Positive for sore throat and trouble swallowing. Negative for congestion, mouth sores and sinus pressure.   Eyes: Negative for visual disturbance.  Respiratory: Positive for cough. Negative for chest tightness.   Gastrointestinal: Negative for abdominal pain and nausea.  Genitourinary: Negative for difficulty urinating, frequency and vaginal pain.  Musculoskeletal: Negative for back pain and gait problem.  Skin: Negative for pallor and rash.  Neurological: Negative for dizziness, tremors, weakness, numbness and headaches.  Psychiatric/Behavioral: Negative for confusion and sleep disturbance.     Objective:  BP 140/82   Pulse (!) 105   Temp 98.3 F (36.8 C) (Oral)   Wt 177 lb (80.3 kg)   SpO2 95%   BMI 30.38 kg/m   BP Readings from Last 3 Encounters:  09/03/16 140/82  03/21/16 132/90  01/23/16 130/85    Wt Readings from Last 3 Encounters:  09/03/16 177 lb (80.3 kg)  03/21/16 170 lb (77.1 kg)  01/23/16 169 lb (76.7 kg)    Physical Exam  Constitutional: She appears well-developed. No distress.  HENT:  Head: Normocephalic.  Right Ear: External ear normal.  Left Ear: External ear normal.  Nose: Nose normal.  Mouth/Throat: Oropharynx is clear and moist.  Eyes: Conjunctivae are normal. Pupils are equal, round, and reactive to light. Right eye exhibits no discharge. Left eye exhibits no discharge.  Neck: Normal range of motion. Neck supple. No JVD present. No tracheal deviation present. No thyromegaly present.  Cardiovascular: Normal rate, regular rhythm and normal heart sounds.   Pulmonary/Chest: No stridor. No respiratory distress. She has no wheezes.  Abdominal: Soft. Bowel sounds are normal. She exhibits no distension and no mass. There is no tenderness. There is no rebound and no guarding.  Musculoskeletal: She exhibits no edema or tenderness.  Lymphadenopathy:    She has no cervical adenopathy.  Neurological: She displays normal reflexes. No cranial nerve deficit. She exhibits normal muscle tone. Coordination normal.  Skin: No rash noted. No erythema.  Psychiatric: She has a normal mood and affect. Her behavior is normal. Judgment and thought content normal.  eryth throat 6 mm white patch on uvula tip  Lab Results  Component Value Date   WBC 4.2 01/23/2016   HGB 14.3 01/23/2016   HCT 42.6 01/23/2016   PLT 372.0 01/23/2016   GLUCOSE 94 01/23/2016   CHOL 173 01/23/2016   TRIG 83.0 01/23/2016   HDL 63.70 01/23/2016   LDLCALC 92 01/23/2016   ALT 14 01/23/2016   AST 19 01/23/2016   NA 141 01/23/2016   K 4.2 01/23/2016   CL 106 01/23/2016    CREATININE 0.69 01/23/2016   BUN 22 01/23/2016   CO2 29 01/23/2016   TSH 1.94 01/23/2016    Dg Bone Density  Result Date: 04/01/2016 Date of study: 03/26/16 Exam: DUAL X-RAY ABSORPTIOMETRY (DXA) FOR BONE MINERAL DENSITY (BMD) Instrument: [employer] Provider: PCP Indication: follow up for low BMD Comparison: none (please note that it is not possible to compare data from different instruments) Clinical data: Pt is a postmenopausal 65 y.o. female with previous h/o fracture. On calcium and vitamin D. Results:  Lumbar spine (L1-L4) Femoral neck (FN) 33% distal radius T-score -1.4 RFN:-1.4 LFN:-1.0 n/a Change in BMD from previous DXA test (%) Down 11.8% Down 6.7% n/a (*) statistically significant Assessment: the BMD is low according to the [facility] classification for osteoporosis (see below). Fracture risk: moderate FRAX score: 10 year major osteoporotic risk: 14.3%. 10 year hip fracture risk: 1.6%. These are under the thresholds for treatment of 20% and 3%, respectively. Comments: the technical quality of the study is good.  A reported spinal block noted in L2-L3 may falsely affect spine score/BMD Evaluation for secondary causes should be considered if clinically indicated. Recommend optimizing calcium (1200 mg/day) and vitamin D (800 IU/day) intake. Followup: Repeat BMD is appropriate after 2 years or after 1-2 years if starting treatment. WHO criteria for diagnosis of osteoporosis in postmenopausal women and in men 41 y/o or older: - normal: T-score -1.0 to + 1.0 - osteopenia/low bone density: T-score between -2.5 and -1.0 - osteoporosis: T-score below -2.5 - severe osteoporosis: T-score below -2.5 with history of fragility fracture Note: although not part of the WHO classification, the presence of a fragility fracture, regardless of the T-score, should be considered diagnostic of osteoporosis, provided other causes for the fracture have been excluded. Treatment: The National Osteoporosis  Foundation recommends that treatment be considered in postmenopausal women and men age 42 or older with: 1. Hip or vertebral (clinical or morphometric) fracture 2. T-score of - 2.5 or lower at the spine or hip 3. 10-year fracture probability by FRAX of at least 20% for a major osteoporotic fracture and 3% for a hip fracture [name]    Assessment & Plan:   Nan was seen today for sore throat and ear pain.  Diagnoses and all orders for this visit:  Streptococcal sore throat -     POCT rapid strep A   I have discontinued Ms. Dioguardi's cefUROXime and predniSONE. I am also having her maintain her VITAMIN E PO, aspirin, metroNIDAZOLE, acyclovir, acyclovir, etodolac, atorvastatin, and ALPRAZolam.  Meds ordered this encounter  Medications  . ALPRAZolam (XANAX) 0.25 MG tablet    Sig: Take 1 tablet by mouth 2 (two) times daily as needed.    Refill:  1     Follow-up: No Follow-up on file.  Walker Kehr, MD

## 2016-09-03 NOTE — Assessment & Plan Note (Signed)
BP Readings from Last 3 Encounters:  09/03/16 140/82  03/21/16 132/90  01/23/16 130/85

## 2016-09-03 NOTE — Assessment & Plan Note (Addendum)
x3 wks ?etiology: ?thrush vs other Start Nystatin po Ceftin if worse

## 2016-09-03 NOTE — Progress Notes (Signed)
Pre visit review using our clinic review tool, if applicable. No additional management support is needed unless otherwise documented below in the visit note. 

## 2016-09-03 NOTE — Assessment & Plan Note (Signed)
Start Omeprazole 

## 2016-12-19 ENCOUNTER — Other Ambulatory Visit: Payer: Self-pay | Admitting: Internal Medicine

## 2016-12-26 ENCOUNTER — Other Ambulatory Visit: Payer: Self-pay | Admitting: Internal Medicine

## 2017-01-28 ENCOUNTER — Encounter: Payer: Medicare Other | Admitting: Internal Medicine

## 2017-02-04 ENCOUNTER — Encounter: Payer: Medicare Other | Admitting: Internal Medicine

## 2017-02-05 ENCOUNTER — Encounter: Payer: Medicare Other | Admitting: Internal Medicine

## 2017-02-20 LAB — HM MAMMOGRAPHY

## 2017-02-26 ENCOUNTER — Encounter: Payer: Self-pay | Admitting: Internal Medicine

## 2017-02-27 ENCOUNTER — Ambulatory Visit (INDEPENDENT_AMBULATORY_CARE_PROVIDER_SITE_OTHER): Payer: Medicare Other | Admitting: Internal Medicine

## 2017-02-27 ENCOUNTER — Encounter: Payer: Self-pay | Admitting: Internal Medicine

## 2017-02-27 DIAGNOSIS — J309 Allergic rhinitis, unspecified: Secondary | ICD-10-CM | POA: Diagnosis not present

## 2017-02-27 DIAGNOSIS — J0101 Acute recurrent maxillary sinusitis: Secondary | ICD-10-CM

## 2017-02-27 MED ORDER — CEFUROXIME AXETIL 500 MG PO TABS: 500.0000 mg | ORAL_TABLET | Freq: Two times a day (BID) | ORAL | 0 refills | Status: DC

## 2017-02-27 MED ORDER — METHYLPREDNISOLONE ACETATE 80 MG/ML IJ SUSP
80.0000 mg | Freq: Once | INTRAMUSCULAR | Status: AC
  Administered 2017-02-27: 80 mg via INTRAMUSCULAR

## 2017-02-27 NOTE — Addendum Note (Signed)
Addended by: Valere Dross on: 02/27/2017 03:18 PM   Modules accepted: Orders

## 2017-02-27 NOTE — Progress Notes (Signed)
Subjective:  Patient ID: Anna Ellis, female    DOB: May 01, 1951  Age: 66 y.o. MRN: [id number]  CC: Sinus Problem   HPI Anna Ellis presents for sinusitis sx's x 1 week  Outpatient Medications Prior to Visit  Medication Sig Dispense Refill  . acyclovir (ZOVIRAX) 800 MG tablet Reported on 03/21/2016    . ALPRAZolam (XANAX) 0.25 MG tablet Take 1 tablet by mouth 2 (two) times daily as needed.  1  . aspirin 81 MG chewable tablet Chew 81 mg by mouth daily.      Marland Kitchen atorvastatin (LIPITOR) 20 MG tablet TAKE 1 TABLET BY MOUTH  DAILY 90 tablet 1  . etodolac (LODINE) 500 MG tablet TAKE 1 TABLET BY MOUTH TWICE DAILY AS NEEDED WITH FOOD 180 tablet 2  . metroNIDAZOLE (METROCREAM) 0.75 % cream Reported on 03/21/2016  3  . omeprazole (PRILOSEC) 20 MG capsule TAKE 1 CAPSULE (20 MG TOTAL) BY MOUTH 2 (TWO) TIMES DAILY BEFORE A MEAL. WHITE CAPS PLEASE 60 capsule 3  . VITAMIN E PO Take by mouth daily.     . cefUROXime (CEFTIN) 500 MG tablet Take 1 tablet (500 mg total) by mouth 2 (two) times daily. 20 tablet 0  . acyclovir (ZOVIRAX) 800 MG tablet Take 1 tablet (800 mg total) by mouth 5 (five) times daily. 50 tablet 1   No facility-administered medications prior to visit.     ROS Review of Systems  Constitutional: Negative for activity change, appetite change, chills, fatigue and unexpected weight change.  HENT: Positive for rhinorrhea, sinus pain and sinus pressure. Negative for congestion and mouth sores.   Eyes: Negative for visual disturbance.  Respiratory: Negative for cough and chest tightness.   Gastrointestinal: Negative for abdominal pain and nausea.  Genitourinary: Negative for difficulty urinating, frequency and vaginal pain.  Musculoskeletal: Negative for back pain and gait problem.  Skin: Negative for pallor and rash.  Neurological: Negative for dizziness, tremors, weakness, numbness and headaches.  Psychiatric/Behavioral: Negative for confusion and sleep disturbance.     Objective:  BP 136/88   Pulse 78   Temp 98.1 F (36.7 C) (Oral)   Resp 16   Ht 5\' 4"  (1.626 m)   Wt 175 lb (79.4 kg)   SpO2 98%   BMI 30.04 kg/m   BP Readings from Last 3 Encounters:  02/27/17 136/88  09/03/16 140/82  03/21/16 132/90    Wt Readings from Last 3 Encounters:  02/27/17 175 lb (79.4 kg)  09/03/16 177 lb (80.3 kg)  03/21/16 170 lb (77.1 kg)    Physical Exam  Constitutional: She appears well-developed. No distress.  HENT:  Head: Normocephalic.  Right Ear: External ear normal.  Left Ear: External ear normal.  Nose: Nose normal.  Mouth/Throat: Oropharynx is clear and moist.  Eyes: Conjunctivae are normal. Pupils are equal, round, and reactive to light. Right eye exhibits no discharge. Left eye exhibits no discharge.  Neck: Normal range of motion. Neck supple. No JVD present. No tracheal deviation present. No thyromegaly present.  Cardiovascular: Normal rate, regular rhythm and normal heart sounds.   Pulmonary/Chest: No stridor. No respiratory distress. She has no wheezes.  Abdominal: Soft. Bowel sounds are normal. She exhibits no distension and no mass. There is no tenderness. There is no rebound and no guarding.  Musculoskeletal: She exhibits no edema or tenderness.  Lymphadenopathy:    She has no cervical adenopathy.  Neurological: She displays normal reflexes. No cranial nerve deficit. She exhibits normal muscle tone. Coordination normal.  Skin: No rash noted. No erythema.  Psychiatric: She has a normal mood and affect. Her behavior is normal. Judgment and thought content normal.  eryth nasal mucosa  Lab Results  Component Value Date   WBC 4.2 01/23/2016   HGB 14.3 01/23/2016   HCT 42.6 01/23/2016   PLT 372.0 01/23/2016   GLUCOSE 94 01/23/2016   CHOL 173 01/23/2016   TRIG 83.0 01/23/2016   HDL 63.70 01/23/2016   LDLCALC 92 01/23/2016   ALT 14 01/23/2016   AST 19 01/23/2016   NA 141 01/23/2016   K 4.2 01/23/2016   CL 106 01/23/2016    CREATININE 0.69 01/23/2016   BUN 22 01/23/2016   CO2 29 01/23/2016   TSH 1.94 01/23/2016    Dg Bone Density  Result Date: 04/01/2016 Date of study: 03/26/16 Exam: DUAL X-RAY ABSORPTIOMETRY (DXA) FOR BONE MINERAL DENSITY (BMD) Instrument: [employer] Provider: PCP Indication: follow up for low BMD Comparison: none (please note that it is not possible to compare data from different instruments) Clinical data: Pt is a postmenopausal 66 y.o. female with previous h/o fracture. On calcium and vitamin D. Results:  Lumbar spine (L1-L4) Femoral neck (FN) 33% distal radius T-score -1.4 RFN:-1.4 LFN:-1.0 n/a Change in BMD from previous DXA test (%) Down 11.8% Down 6.7% n/a (*) statistically significant Assessment: the BMD is low according to the [facility] classification for osteoporosis (see below). Fracture risk: moderate FRAX score: 10 year major osteoporotic risk: 14.3%. 10 year hip fracture risk: 1.6%. These are under the thresholds for treatment of 20% and 3%, respectively. Comments: the technical quality of the study is good.  A reported spinal block noted in L2-L3 may falsely affect spine score/BMD Evaluation for secondary causes should be considered if clinically indicated. Recommend optimizing calcium (1200 mg/day) and vitamin D (800 IU/day) intake. Followup: Repeat BMD is appropriate after 2 years or after 1-2 years if starting treatment. WHO criteria for diagnosis of osteoporosis in postmenopausal women and in men 74 y/o or older: - normal: T-score -1.0 to + 1.0 - osteopenia/low bone density: T-score between -2.5 and -1.0 - osteoporosis: T-score below -2.5 - severe osteoporosis: T-score below -2.5 with history of fragility fracture Note: although not part of the WHO classification, the presence of a fragility fracture, regardless of the T-score, should be considered diagnostic of osteoporosis, provided other causes for the fracture have been excluded. Treatment: The National Osteoporosis  Foundation recommends that treatment be considered in postmenopausal women and men age 36 or older with: 1. Hip or vertebral (clinical or morphometric) fracture 2. T-score of - 2.5 or lower at the spine or hip 3. 10-year fracture probability by FRAX of at least 20% for a major osteoporotic fracture and 3% for a hip fracture [name]    Assessment & Plan:   There are no diagnoses linked to this encounter. I am having Ms. Iturralde maintain her VITAMIN E PO, aspirin, metroNIDAZOLE, acyclovir, etodolac, ALPRAZolam, cefUROXime, atorvastatin, omeprazole, and Cholecalciferol (VITAMIN D3 PO).  Meds ordered this encounter  Medications  . Cholecalciferol (VITAMIN D3 PO)    Sig: Take by mouth.     Follow-up: No Follow-up on file.  Walker Kehr, MD

## 2017-02-27 NOTE — Assessment & Plan Note (Signed)
Ceftin x10 d 

## 2017-02-27 NOTE — Progress Notes (Signed)
Pre-visit discussion using our clinic review tool. No additional management support is needed unless otherwise documented below in the visit note.  

## 2017-02-27 NOTE — Assessment & Plan Note (Signed)
Depo-medrol 80 mg IM 

## 2017-03-26 ENCOUNTER — Telehealth: Payer: Self-pay | Admitting: Internal Medicine

## 2017-03-26 NOTE — Telephone Encounter (Signed)
Called patient to schedule awv. Lvm for patient to call office to schedule appt.  °

## 2017-03-28 ENCOUNTER — Encounter: Payer: Self-pay | Admitting: Internal Medicine

## 2017-03-28 ENCOUNTER — Ambulatory Visit (INDEPENDENT_AMBULATORY_CARE_PROVIDER_SITE_OTHER): Payer: Medicare Other | Admitting: Internal Medicine

## 2017-03-28 ENCOUNTER — Other Ambulatory Visit (INDEPENDENT_AMBULATORY_CARE_PROVIDER_SITE_OTHER): Payer: Medicare Other

## 2017-03-28 VITALS — BP 142/88 | HR 78 | Temp 97.8°F | Ht 64.0 in | Wt 174.1 lb

## 2017-03-28 DIAGNOSIS — Z0000 Encounter for general adult medical examination without abnormal findings: Secondary | ICD-10-CM | POA: Diagnosis not present

## 2017-03-28 DIAGNOSIS — F411 Generalized anxiety disorder: Secondary | ICD-10-CM

## 2017-03-28 LAB — CBC WITH DIFFERENTIAL/PLATELET
Basophils Absolute: 0.1 10*3/uL (ref 0.0–0.1)
Basophils Relative: 1.2 % (ref 0.0–3.0)
Eosinophils Absolute: 0.2 10*3/uL (ref 0.0–0.7)
Eosinophils Relative: 4.8 % (ref 0.0–5.0)
HCT: 43 % (ref 36.0–46.0)
Hemoglobin: 14.5 g/dL (ref 12.0–15.0)
Lymphocytes Relative: 28.4 % (ref 12.0–46.0)
Lymphs Abs: 1.3 10*3/uL (ref 0.7–4.0)
MCHC: 33.6 g/dL (ref 30.0–36.0)
MCV: 93.4 fl (ref 78.0–100.0)
Monocytes Absolute: 0.5 10*3/uL (ref 0.1–1.0)
Monocytes Relative: 10.4 % (ref 3.0–12.0)
Neutro Abs: 2.5 10*3/uL (ref 1.4–7.7)
Neutrophils Relative %: 55.2 % (ref 43.0–77.0)
Platelets: 378 10*3/uL (ref 150.0–400.0)
RBC: 4.6 Mil/uL (ref 3.87–5.11)
RDW: 13.4 % (ref 11.5–15.5)
WBC: 4.5 10*3/uL (ref 4.0–10.5)

## 2017-03-28 LAB — BASIC METABOLIC PANEL
BUN: 16 mg/dL (ref 6–23)
CO2: 29 mEq/L (ref 19–32)
Calcium: 9.6 mg/dL (ref 8.4–10.5)
Chloride: 106 mEq/L (ref 96–112)
Creatinine, Ser: 0.78 mg/dL (ref 0.40–1.20)
GFR: 78.12 mL/min (ref >60.00)
Glucose, Bld: 92 mg/dL (ref 70–99)
Potassium: 4.3 mEq/L (ref 3.5–5.1)
Sodium: 141 mEq/L (ref 135–145)

## 2017-03-28 LAB — URINALYSIS
Ketones, ur: NEGATIVE
Leukocytes, UA: NEGATIVE
Nitrite: NEGATIVE
Specific Gravity, Urine: >=1.03 — AB (ref 1.000–1.030)
Total Protein, Urine: NEGATIVE
Urine Glucose: NEGATIVE
Urobilinogen, UA: 0.2 (ref 0.0–1.0)
pH: 5.5 (ref 5.0–8.0)

## 2017-03-28 LAB — HEPATIC FUNCTION PANEL
ALT: 15 U/L (ref 0–35)
AST: 16 U/L (ref 0–37)
Albumin: 4.4 g/dL (ref 3.5–5.2)
Alkaline Phosphatase: 68 U/L (ref 39–117)
Bilirubin, Direct: 0.2 mg/dL (ref 0.0–0.3)
Total Bilirubin: 0.6 mg/dL (ref 0.2–1.2)
Total Protein: 6.8 g/dL (ref 6.0–8.3)

## 2017-03-28 LAB — LIPID PANEL
Cholesterol: 154 mg/dL (ref 0–200)
HDL: 65.2 mg/dL (ref >39.00)
LDL Cholesterol: 71 mg/dL (ref 0–99)
NonHDL: 88.51
Total CHOL/HDL Ratio: 2
Triglycerides: 89 mg/dL (ref 0.0–149.0)
VLDL: 17.8 mg/dL (ref 0.0–40.0)

## 2017-03-28 LAB — TSH: TSH: 2.36 u[IU]/mL (ref 0.35–4.50)

## 2017-03-28 MED ORDER — ETODOLAC 500 MG PO TABS: ORAL_TABLET | ORAL | 2 refills | Status: DC

## 2017-03-28 MED ORDER — ATORVASTATIN CALCIUM 20 MG PO TABS: 20.0000 mg | ORAL_TABLET | Freq: Every day | ORAL | 1 refills | Status: DC

## 2017-03-28 MED ORDER — ALPRAZOLAM 0.25 MG PO TABS: 0.2500 mg | ORAL_TABLET | Freq: Two times a day (BID) | ORAL | 1 refills | Status: DC | PRN

## 2017-03-28 NOTE — Progress Notes (Signed)
Subjective:  Patient ID: Anna Ellis, female    DOB: 1951-07-25  Age: 66 y.o. MRN: [id number]  CC: No chief complaint on file.   HPI Anna Ellis presents for a well exam   Outpatient Medications Prior to Visit  Medication Sig Dispense Refill  . acyclovir (ZOVIRAX) 800 MG tablet Reported on 03/21/2016    . ALPRAZolam (XANAX) 0.25 MG tablet Take 1 tablet by mouth 2 (two) times daily as needed.  1  . aspirin 81 MG chewable tablet Chew 81 mg by mouth daily.      Marland Kitchen atorvastatin (LIPITOR) 20 MG tablet TAKE 1 TABLET BY MOUTH  DAILY 90 tablet 1  . cefUROXime (CEFTIN) 500 MG tablet Take 1 tablet (500 mg total) by mouth 2 (two) times daily. 20 tablet 0  . Cholecalciferol (VITAMIN D3) 2000 units TABS Take by mouth.    . etodolac (LODINE) 500 MG tablet TAKE 1 TABLET BY MOUTH TWICE DAILY AS NEEDED WITH FOOD 180 tablet 2  . metroNIDAZOLE (METROCREAM) 0.75 % cream Reported on 03/21/2016  3  . omeprazole (PRILOSEC) 20 MG capsule TAKE 1 CAPSULE (20 MG TOTAL) BY MOUTH 2 (TWO) TIMES DAILY BEFORE A MEAL. WHITE CAPS PLEASE 60 capsule 3  . VITAMIN E PO Take by mouth daily.      No facility-administered medications prior to visit.     ROS Review of Systems  Constitutional: Negative for activity change, appetite change, chills, fatigue and unexpected weight change.  HENT: Positive for congestion. Negative for mouth sores and sinus pressure.   Eyes: Negative for visual disturbance.  Respiratory: Negative for cough and chest tightness.   Gastrointestinal: Negative for abdominal pain and nausea.  Genitourinary: Negative for difficulty urinating, frequency and vaginal pain.  Musculoskeletal: Negative for back pain and gait problem.  Skin: Negative for pallor and rash.  Neurological: Negative for dizziness, tremors, weakness, numbness and headaches.  Psychiatric/Behavioral: Negative for confusion, sleep disturbance and suicidal ideas.    Objective:  BP (!) 142/88 (BP Location: Left Arm, Patient  Position: Sitting, Cuff Size: Normal)   Pulse 78   Temp 97.8 F (36.6 C) (Oral)   Ht 5\' 4"  (1.626 m)   Wt 174 lb 1.9 oz (79 kg)   SpO2 98%   BMI 29.89 kg/m   BP Readings from Last 3 Encounters:  03/28/17 (!) 142/88  02/27/17 136/88  09/03/16 140/82    Wt Readings from Last 3 Encounters:  03/28/17 174 lb 1.9 oz (79 kg)  02/27/17 175 lb (79.4 kg)  09/03/16 177 lb (80.3 kg)    Physical Exam  Constitutional: She appears well-developed. No distress.  HENT:  Head: Normocephalic.  Right Ear: External ear normal.  Left Ear: External ear normal.  Nose: Nose normal.  Mouth/Throat: Oropharynx is clear and moist.  Eyes: Conjunctivae are normal. Pupils are equal, round, and reactive to light. Right eye exhibits no discharge. Left eye exhibits no discharge.  Neck: Normal range of motion. Neck supple. No JVD present. No tracheal deviation present. No thyromegaly present.  Cardiovascular: Normal rate, regular rhythm and normal heart sounds.   Pulmonary/Chest: No stridor. No respiratory distress. She has no wheezes.  Abdominal: Soft. Bowel sounds are normal. She exhibits no distension and no mass. There is no tenderness. There is no rebound and no guarding.  Musculoskeletal: She exhibits tenderness. She exhibits no edema.  Lymphadenopathy:    She has no cervical adenopathy.  Neurological: She displays normal reflexes. No cranial nerve deficit. She exhibits normal muscle  tone. Coordination normal.  Skin: No rash noted. No erythema.  Psychiatric: She has a normal mood and affect. Her behavior is normal. Judgment and thought content normal.    Lab Results  Component Value Date   WBC 4.2 01/23/2016   HGB 14.3 01/23/2016   HCT 42.6 01/23/2016   PLT 372.0 01/23/2016   GLUCOSE 94 01/23/2016   CHOL 173 01/23/2016   TRIG 83.0 01/23/2016   HDL 63.70 01/23/2016   LDLCALC 92 01/23/2016   ALT 14 01/23/2016   AST 19 01/23/2016   NA 141 01/23/2016   K 4.2 01/23/2016   CL 106 01/23/2016    CREATININE 0.69 01/23/2016   BUN 22 01/23/2016   CO2 29 01/23/2016   TSH 1.94 01/23/2016    Dg Bone Density  Result Date: 04/01/2016 Date of study: 03/26/16 Exam: DUAL X-RAY ABSORPTIOMETRY (DXA) FOR BONE MINERAL DENSITY (BMD) Instrument: [employer] Provider: PCP Indication: follow up for low BMD Comparison: none (please note that it is not possible to compare data from different instruments) Clinical data: Pt is a postmenopausal 66 y.o. female with previous h/o fracture. On calcium and vitamin D. Results:  Lumbar spine (L1-L4) Femoral neck (FN) 33% distal radius T-score -1.4 RFN:-1.4 LFN:-1.0 n/a Change in BMD from previous DXA test (%) Down 11.8% Down 6.7% n/a (*) statistically significant Assessment: the BMD is low according to the [facility] classification for osteoporosis (see below). Fracture risk: moderate FRAX score: 10 year major osteoporotic risk: 14.3%. 10 year hip fracture risk: 1.6%. These are under the thresholds for treatment of 20% and 3%, respectively. Comments: the technical quality of the study is good.  A reported spinal block noted in L2-L3 may falsely affect spine score/BMD Evaluation for secondary causes should be considered if clinically indicated. Recommend optimizing calcium (1200 mg/day) and vitamin D (800 IU/day) intake. Followup: Repeat BMD is appropriate after 2 years or after 1-2 years if starting treatment. WHO criteria for diagnosis of osteoporosis in postmenopausal women and in men 11 y/o or older: - normal: T-score -1.0 to + 1.0 - osteopenia/low bone density: T-score between -2.5 and -1.0 - osteoporosis: T-score below -2.5 - severe osteoporosis: T-score below -2.5 with history of fragility fracture Note: although not part of the WHO classification, the presence of a fragility fracture, regardless of the T-score, should be considered diagnostic of osteoporosis, provided other causes for the fracture have been excluded. Treatment: The National Osteoporosis  Foundation recommends that treatment be considered in postmenopausal women and men age 74 or older with: 1. Hip or vertebral (clinical or morphometric) fracture 2. T-score of - 2.5 or lower at the spine or hip 3. 10-year fracture probability by FRAX of at least 20% for a major osteoporotic fracture and 3% for a hip fracture [name]    Assessment & Plan:   There are no diagnoses linked to this encounter. I am having Ms. Kindig maintain her VITAMIN E PO, aspirin, metroNIDAZOLE, acyclovir, etodolac, ALPRAZolam, atorvastatin, omeprazole, Vitamin D3, and cefUROXime.  No orders of the defined types were placed in this encounter.    Follow-up: No Follow-up on file.  Walker Kehr, MD

## 2017-03-28 NOTE — Progress Notes (Signed)
Pre visit review using our clinic review tool, if applicable. No additional management support is needed unless otherwise documented below in the visit note. 

## 2017-03-28 NOTE — Assessment & Plan Note (Signed)
Xanax prn 

## 2017-03-28 NOTE — Assessment & Plan Note (Addendum)
Here for medicare wellness/physical  Diet: heart healthy  Physical activity: not sedentary  Depression/mood screen: negative  Hearing: intact to whispered voice  Visual acuity: grossly normal, performs annual eye exam  ADLs: capable  Fall risk: low to none  Home safety: good  Cognitive evaluation: intact to orientation, naming, recall and repetition  EOL planning: adv directives, full code/ I agree  I have personally reviewed and have noted  1. The patient's medical, surgical and social history  2. Their use of alcohol, tobacco or illicit drugs  3. Their current medications and supplements  4. The patient's functional ability including ADL's, fall risks, home safety risks and hearing or visual impairment.  5. Diet and physical activities  6. Evidence for depression or mood disorders 7. The roster of all physicians providing medical care to patient - is listed in the Snapshot section of the chart and reviewed today.    Today patient counseled on age appropriate routine health concerns for screening and prevention, each reviewed and up to date or declined. Immunizations reviewed and up to date or declined. Labs ordered and reviewed. Risk factors for depression reviewed and negative. Hearing function and visual acuity are intact. ADLs screened and addressed as needed. Functional ability and level of safety reviewed and appropriate. Education, counseling and referrals performed based on assessed risks today. Patient provided with a copy of personalized plan for preventive services.   Colon is due

## 2017-07-22 ENCOUNTER — Encounter: Payer: Self-pay | Admitting: Family

## 2017-07-22 ENCOUNTER — Ambulatory Visit (INDEPENDENT_AMBULATORY_CARE_PROVIDER_SITE_OTHER): Payer: Medicare Other | Admitting: Family

## 2017-07-22 VITALS — BP 146/82 | HR 68 | Temp 98.4°F | Resp 16 | Ht 64.0 in | Wt 181.4 lb

## 2017-07-22 DIAGNOSIS — J0140 Acute pansinusitis, unspecified: Secondary | ICD-10-CM

## 2017-07-22 DIAGNOSIS — J329 Chronic sinusitis, unspecified: Secondary | ICD-10-CM | POA: Diagnosis not present

## 2017-07-22 MED ORDER — METHYLPREDNISOLONE ACETATE 80 MG/ML IJ SUSP
80.0000 mg | Freq: Once | INTRAMUSCULAR | Status: AC
  Administered 2017-07-22: 80 mg via INTRAMUSCULAR

## 2017-07-22 MED ORDER — CEFUROXIME AXETIL 500 MG PO TABS: 500.0000 mg | ORAL_TABLET | Freq: Two times a day (BID) | ORAL | 0 refills | Status: DC

## 2017-07-22 MED ORDER — PREDNISONE 10 MG (21) PO TBPK: ORAL_TABLET | ORAL | 0 refills | Status: DC

## 2017-07-22 NOTE — Patient Instructions (Signed)
Thank you for choosing [facility].  SUMMARY AND INSTRUCTIONS:  Please start the Ceftin.  Start the prednisone taper.  Medication:  Your prescription(s) have been submitted to your pharmacy or been printed and provided for you. Please take as directed and contact our office if you believe you are having problem(s) with the medication(s) or have any questions.  Follow up:  If your symptoms worsen or fail to improve, please contact our office for further instruction, or in case of emergency go directly to the emergency room at the closest medical facility.   General Recommendations:    Please drink plenty of fluids.  Get plenty of rest   Sleep in humidified air  Use saline nasal sprays  Netti pot   OTC Medications:  Decongestants - helps relieve congestion   Flonase (generic fluticasone) or Nasacort (generic triamcinolone) - please make sure to use the "cross-over" technique at a 45 degree angle towards the opposite eye as opposed to straight up the nasal passageway.   Sudafed (generic pseudoephedrine - Note this is the one that is available behind the pharmacy counter); Products with phenylephrine (-PE) may also be used but is often not as effective as pseudoephedrine.   If you have HIGH BLOOD PRESSURE - Coricidin HBP; AVOID any product that is -D as this contains pseudoephedrine which may increase your blood pressure.  Afrin (oxymetazoline) every 6-8 hours for up to 3 days.   Allergies - helps relieve runny nose, itchy eyes and sneezing   Claritin (generic loratidine), Allegra (fexofenidine), or Zyrtec (generic cyrterizine) for runny nose. These medications should not cause drowsiness.  Note - Benadryl (generic diphenhydramine) may be used however may cause drowsiness  Cough -   Delsym or Robitussin (generic dextromethorphan)  Expectorants - helps loosen mucus to ease removal   Mucinex (generic guaifenesin) as directed on the package.  Headaches /  General Aches   Tylenol (generic acetaminophen) - DO NOT EXCEED 3 grams (3,000 mg) in a 24 hour time period  Advil/Motrin (generic ibuprofen)   Sore Throat -   Salt water gargle   Chloraseptic (generic benzocaine) spray or lozenges / Sucrets (generic dyclonine)

## 2017-07-22 NOTE — Progress Notes (Signed)
Subjective:    Patient ID: Anna Ellis, female    DOB: 22-Jun-1951, 66 y.o.   MRN: [id number]  Chief Complaint  Patient presents with  . Sinus pressure    having sinus pressure and headaches, eye pain x4 days     HPI:  Anna Ellis is a 66 y.o. female who  has a past medical history of ALLERGIC RHINITIS (09/19/2007); Allergic urticaria (12/15/2008); ANEMIA-IRON DEFICIENCY (09/19/2007); ANXIETY (09/19/2007); Arthritis; Asthma with hay fever (06/15/2011); Asthma, cough variant (12/15/2008); Basal cell cancer; CAROTID BRUIT (01/08/2011); Hyperlipidemia; OSTEOARTHRITIS (09/19/2007); and SHOULDER PAIN (01/06/2010). and presents today for an acute office visit.   This is a new problem. Associated symptoms of sinus pressure, headaches, eye pain, occasional dental pain and congestion have been going on for about 4 days. Modifying factors include steriod eye drops and Zyrtec which have not helped very much. No fevers.  Course of the symptoms is progressively worsened.   Allergies  Allergen Reactions  . Clarithromycin   . Food Color Red   . Sulfonamide Derivatives   . Cobalt Rash    Found on Allergy Test  . Parabens Rash      Outpatient Medications Prior to Visit  Medication Sig Dispense Refill  . acyclovir (ZOVIRAX) 800 MG tablet Reported on 03/21/2016    . ALPRAZolam (XANAX) 0.25 MG tablet Take 1 tablet (0.25 mg total) by mouth 2 (two) times daily as needed. 30 tablet 1  . aspirin 81 MG chewable tablet Chew 81 mg by mouth daily.      Marland Kitchen atorvastatin (LIPITOR) 20 MG tablet Take 1 tablet (20 mg total) by mouth daily. 90 tablet 1  . Cholecalciferol (VITAMIN D3) 2000 units TABS Take by mouth.    . etodolac (LODINE) 500 MG tablet TAKE 1 TABLET BY MOUTH TWICE DAILY AS NEEDED WITH FOOD 180 tablet 2  . metroNIDAZOLE (METROCREAM) 0.75 % cream Reported on 03/21/2016  3  . omeprazole (PRILOSEC) 20 MG capsule TAKE 1 CAPSULE (20 MG TOTAL) BY MOUTH 2 (TWO) TIMES DAILY BEFORE A MEAL. WHITE CAPS PLEASE 60  capsule 3  . cefUROXime (CEFTIN) 500 MG tablet Take 1 tablet (500 mg total) by mouth 2 (two) times daily. 20 tablet 0  . VITAMIN E PO Take by mouth daily.      No facility-administered medications prior to visit.     Past Medical History:  Diagnosis Date  . ALLERGIC RHINITIS 09/19/2007  . Allergic urticaria 12/15/2008  . ANEMIA-IRON DEFICIENCY 09/19/2007  . ANXIETY 09/19/2007  . Arthritis   . Asthma with hay fever 06/15/2011  . Asthma, cough variant 12/15/2008  . Basal cell cancer   . CAROTID BRUIT 01/08/2011  . Hyperlipidemia   . OSTEOARTHRITIS 09/19/2007  . SHOULDER PAIN 01/06/2010      Review of Systems  Constitutional: Negative for chills and fever.  HENT: Positive for congestion, sinus pain and sinus pressure. Negative for sore throat.   Respiratory: Negative for cough, chest tightness, shortness of breath and wheezing.   Neurological: Positive for headaches.      Objective:    BP (!) 146/82 (BP Location: Left Arm, Patient Position: Sitting, Cuff Size: Large)   Pulse 68   Temp 98.4 F (36.9 C) (Oral)   Resp 16   Ht 5\' 4"  (1.626 m)   Wt 181 lb 6.4 oz (82.3 kg)   SpO2 97%   BMI 31.14 kg/m  Nursing note and vital signs reviewed.  Physical Exam  Constitutional: She is oriented to person, place,  and time. She appears well-developed and well-nourished.  HENT:  Right Ear: Hearing, tympanic membrane, external ear and ear canal normal.  Left Ear: Hearing, tympanic membrane, external ear and ear canal normal.  Nose: Right sinus exhibits maxillary sinus tenderness and frontal sinus tenderness. Left sinus exhibits no maxillary sinus tenderness and no frontal sinus tenderness.  Mouth/Throat: Uvula is midline, oropharynx is clear and moist and mucous membranes are normal.  Neck: Neck supple.  Cardiovascular: Normal rate, regular rhythm, normal heart sounds and intact distal pulses.   Pulmonary/Chest: Effort normal and breath sounds normal.  Neurological: She is alert and  oriented to person, place, and time.  Skin: Skin is warm and dry.       Assessment & Plan:   Problem List Items Addressed This Visit      Respiratory   Sinusitis - Primary    Symptoms and exam consistent with sinusitis with concern for bacterial infection given single-sided symptoms. In office injection of Depo-Medrol provided with prednisone taper to follow as needed. Start Ceftin. Continue over-the-counter medications as needed for symptom relief and supportive care. Follow-up if symptoms worsen or do not improve.      Relevant Medications   cefUROXime (CEFTIN) 500 MG tablet   predniSONE (STERAPRED UNI-PAK 21 TAB) 10 MG (21) TBPK tablet   methylPREDNISolone acetate (DEPO-MEDROL) injection 80 mg (Completed)       I have discontinued Ms. Lunz's VITAMIN E PO. I am also having her start on predniSONE. Additionally, I am having her maintain her aspirin, metroNIDAZOLE, acyclovir, omeprazole, Vitamin D3, ALPRAZolam, etodolac, atorvastatin, and cefUROXime. We administered methylPREDNISolone acetate.   Meds ordered this encounter  Medications  . cefUROXime (CEFTIN) 500 MG tablet    Sig: Take 1 tablet (500 mg total) by mouth 2 (two) times daily.    Dispense:  20 tablet    Refill:  0    White tabs please    Order Specific Question:   Supervising Provider    Answer:   Pricilla Holm A [[id number]]  . predniSONE (STERAPRED UNI-PAK 21 TAB) 10 MG (21) TBPK tablet    Sig: Take 6 tablets x 1 day, 5 tablets x 1 day, 4 tablets x 1 day, 3 tablets x 1 day, 2 tablets x 1 day, 1 tablet x 1 day    Dispense:  21 tablet    Refill:  0    Order Specific Question:   Supervising Provider    Answer:   Pricilla Holm A [[id number]]  . methylPREDNISolone acetate (DEPO-MEDROL) injection 80 mg     Follow-up: Return if symptoms worsen or fail to improve.  Mauricio Po, FNP

## 2017-07-22 NOTE — Assessment & Plan Note (Signed)
Symptoms and exam consistent with sinusitis with concern for bacterial infection given single-sided symptoms. In office injection of Depo-Medrol provided with prednisone taper to follow as needed. Start Ceftin. Continue over-the-counter medications as needed for symptom relief and supportive care. Follow-up if symptoms worsen or do not improve.

## 2017-08-05 ENCOUNTER — Ambulatory Visit (INDEPENDENT_AMBULATORY_CARE_PROVIDER_SITE_OTHER): Payer: Medicare Other | Admitting: Internal Medicine

## 2017-08-05 ENCOUNTER — Encounter: Payer: Self-pay | Admitting: Internal Medicine

## 2017-08-05 DIAGNOSIS — H5711 Ocular pain, right eye: Secondary | ICD-10-CM

## 2017-08-05 DIAGNOSIS — G44099 Other trigeminal autonomic cephalgias (TAC), not intractable: Secondary | ICD-10-CM | POA: Diagnosis not present

## 2017-08-05 DIAGNOSIS — J0140 Acute pansinusitis, unspecified: Secondary | ICD-10-CM

## 2017-08-05 MED ORDER — PREDNISONE 10 MG (21) PO TBPK: ORAL_TABLET | ORAL | 0 refills | Status: DC

## 2017-08-05 MED ORDER — CEFUROXIME AXETIL 500 MG PO TABS: 500.0000 mg | ORAL_TABLET | Freq: Two times a day (BID) | ORAL | 0 refills | Status: DC

## 2017-08-05 MED ORDER — MOMETASONE FUROATE 50 MCG/ACT NA SUSP: 2.0000 | Freq: Every day | NASAL | 3 refills | Status: DC

## 2017-08-05 MED ORDER — ACYCLOVIR 800 MG PO TABS: 800.0000 mg | ORAL_TABLET | Freq: Every day | ORAL | 1 refills | Status: DC

## 2017-08-05 MED ORDER — MONTELUKAST SODIUM 10 MG PO TABS: 10.0000 mg | ORAL_TABLET | Freq: Every day | ORAL | 3 refills | Status: DC

## 2017-08-05 NOTE — Assessment & Plan Note (Signed)
Recurrent severe R HA x 5 wks - trigeminal neuralgia vs other Acyclovir po x7d

## 2017-08-05 NOTE — Addendum Note (Signed)
Addended by: Earnstine Regal on: 08/05/2017 04:59 PM   Modules accepted: Orders

## 2017-08-05 NOTE — Assessment & Plan Note (Signed)
Resume abx x 3 weeks Probiotic Afrin x 5-7 d Nasonex

## 2017-08-05 NOTE — Progress Notes (Signed)
Subjective:  Patient ID: Anna Ellis, female    DOB: 01/09/1951  Age: 66 y.o. MRN: [id number]  CC: No chief complaint on file.   HPI Anna Ellis presents for sinusitis on 8/13 - in office injection of Depo-Medrol provided with prednisone taper to follow as needed. Finished Ceftin. The sx's resolved and came back. C/o R sinus pain worse  Outpatient Medications Prior to Visit  Medication Sig Dispense Refill  . acyclovir (ZOVIRAX) 800 MG tablet Reported on 03/21/2016    . ALPRAZolam (XANAX) 0.25 MG tablet Take 1 tablet (0.25 mg total) by mouth 2 (two) times daily as needed. 30 tablet 1  . aspirin 81 MG chewable tablet Chew 81 mg by mouth daily.      Marland Kitchen atorvastatin (LIPITOR) 20 MG tablet Take 1 tablet (20 mg total) by mouth daily. 90 tablet 1  . Cholecalciferol (VITAMIN D3) 2000 units TABS Take by mouth.    . etodolac (LODINE) 500 MG tablet TAKE 1 TABLET BY MOUTH TWICE DAILY AS NEEDED WITH FOOD 180 tablet 2  . metroNIDAZOLE (METROCREAM) 0.75 % cream Reported on 03/21/2016  3  . omeprazole (PRILOSEC) 20 MG capsule TAKE 1 CAPSULE (20 MG TOTAL) BY MOUTH 2 (TWO) TIMES DAILY BEFORE A MEAL. WHITE CAPS PLEASE 60 capsule 3  . cefUROXime (CEFTIN) 500 MG tablet Take 1 tablet (500 mg total) by mouth 2 (two) times daily. 20 tablet 0  . predniSONE (STERAPRED UNI-PAK 21 TAB) 10 MG (21) TBPK tablet Take 6 tablets x 1 day, 5 tablets x 1 day, 4 tablets x 1 day, 3 tablets x 1 day, 2 tablets x 1 day, 1 tablet x 1 day 21 tablet 0   No facility-administered medications prior to visit.     ROS Review of Systems  Constitutional: Negative for activity change, appetite change, chills, fatigue and unexpected weight change.  HENT: Negative for congestion, mouth sores and sinus pressure.   Eyes: Negative for visual disturbance.  Respiratory: Negative for cough and chest tightness.   Gastrointestinal: Negative for abdominal pain and nausea.  Genitourinary: Negative for difficulty urinating, frequency and  vaginal pain.  Musculoskeletal: Negative for back pain and gait problem.  Skin: Negative for pallor and rash.  Neurological: Positive for headaches. Negative for dizziness, tremors, weakness and numbness.  Psychiatric/Behavioral: Negative for confusion and sleep disturbance.    Objective:  BP 132/76 (BP Location: Left Arm, Patient Position: Sitting, Cuff Size: Large)   Pulse 75   Temp 98.3 F (36.8 C) (Oral)   Ht 5\' 4"  (1.626 m)   Wt 180 lb (81.6 kg)   SpO2 99%   BMI 30.90 kg/m   BP Readings from Last 3 Encounters:  08/05/17 132/76  07/22/17 (!) 146/82  03/28/17 (!) 142/88    Wt Readings from Last 3 Encounters:  08/05/17 180 lb (81.6 kg)  07/22/17 181 lb 6.4 oz (82.3 kg)  03/28/17 174 lb 1.9 oz (79 kg)    Physical Exam  Constitutional: She appears well-developed. No distress.  HENT:  Head: Normocephalic.  Right Ear: External ear normal.  Left Ear: External ear normal.  Nose: Nose normal.  Mouth/Throat: Oropharynx is clear and moist.  Eyes: Pupils are equal, round, and reactive to light. Conjunctivae are normal. Right eye exhibits no discharge. Left eye exhibits no discharge.  Neck: Normal range of motion. Neck supple. No JVD present. No tracheal deviation present. No thyromegaly present.  Cardiovascular: Normal rate, regular rhythm and normal heart sounds.   Pulmonary/Chest: No stridor. No  respiratory distress. She has no wheezes.  Abdominal: Soft. Bowel sounds are normal. She exhibits no distension and no mass. There is no tenderness. There is no rebound and no guarding.  Musculoskeletal: She exhibits tenderness. She exhibits no edema.  Lymphadenopathy:    She has no cervical adenopathy.  Neurological: She displays normal reflexes. No cranial nerve deficit. She exhibits normal muscle tone. Coordination normal.  Skin: No rash noted. No erythema.  Psychiatric: She has a normal mood and affect. Her behavior is normal. Judgment and thought content normal.    Lab  Results  Component Value Date   WBC 4.5 03/28/2017   HGB 14.5 03/28/2017   HCT 43.0 03/28/2017   PLT 378.0 03/28/2017   GLUCOSE 92 03/28/2017   CHOL 154 03/28/2017   TRIG 89.0 03/28/2017   HDL 65.20 03/28/2017   LDLCALC 71 03/28/2017   ALT 15 03/28/2017   AST 16 03/28/2017   NA 141 03/28/2017   K 4.3 03/28/2017   CL 106 03/28/2017   CREATININE 0.78 03/28/2017   BUN 16 03/28/2017   CO2 29 03/28/2017   TSH 2.36 03/28/2017    Dg Bone Density  Result Date: 04/01/2016 Date of study: 03/26/16 Exam: DUAL X-RAY ABSORPTIOMETRY (DXA) FOR BONE MINERAL DENSITY (BMD) Instrument: [employer] [facility] Provider: PCP Indication: follow up for low BMD Comparison: none (please note that it is not possible to compare data from different instruments) Clinical data: Pt is a postmenopausal 66 y.o. female with previous h/o fracture. On calcium and vitamin D. Results:  Lumbar spine (L1-L4) Femoral neck (FN) 33% distal radius T-score -1.4 RFN:-1.4 LFN:-1.0 n/a Change in BMD from previous DXA test (%) Down 11.8% Down 6.7% n/a (*) statistically significant Assessment: the BMD is low according to the [facility] classification for osteoporosis (see below). Fracture risk: moderate FRAX score: 10 year major osteoporotic risk: 14.3%. 10 year hip fracture risk: 1.6%. These are under the thresholds for treatment of 20% and 3%, respectively. Comments: the technical quality of the study is good.  A reported spinal block noted in L2-L3 may falsely affect spine score/BMD Evaluation for secondary causes should be considered if clinically indicated. Recommend optimizing calcium (1200 mg/day) and vitamin D (800 IU/day) intake. Followup: Repeat BMD is appropriate after 2 years or after 1-2 years if starting treatment. WHO criteria for diagnosis of osteoporosis in postmenopausal women and in men 47 y/o or older: - normal: T-score -1.0 to + 1.0 - osteopenia/low bone density: T-score between -2.5 and -1.0 - osteoporosis: T-score below  -2.5 - severe osteoporosis: T-score below -2.5 with history of fragility fracture Note: although not part of the WHO classification, the presence of a fragility fracture, regardless of the T-score, should be considered diagnostic of osteoporosis, provided other causes for the fracture have been excluded. Treatment: The National Osteoporosis Foundation recommends that treatment be considered in postmenopausal women and men age 12 or older with: 1. Hip or vertebral (clinical or morphometric) fracture 2. T-score of - 2.5 or lower at the spine or hip 3. 10-year fracture probability by FRAX of at least 20% for a major osteoporotic fracture and 3% for a hip fracture [name]    Assessment & Plan:   There are no diagnoses linked to this encounter. I have discontinued Ms. Coppa's cefUROXime and predniSONE. I am also having her maintain her aspirin, metroNIDAZOLE, acyclovir, omeprazole, Vitamin D3, ALPRAZolam, etodolac, and atorvastatin.  No orders of the defined types were placed in this encounter.    Follow-up: No Follow-up  on file.  Walker Kehr, MD

## 2017-08-05 NOTE — Patient Instructions (Signed)
Use Afrin for 7 days Use Nasonex You can try Cromolyn sodium Read about Singulair   Dry rice sock

## 2017-09-12 HISTORY — PX: COLONOSCOPY: SHX5424

## 2017-09-12 LAB — HM COLONOSCOPY

## 2017-09-13 ENCOUNTER — Encounter: Payer: Self-pay | Admitting: Internal Medicine

## 2017-10-08 ENCOUNTER — Encounter: Payer: Self-pay | Admitting: Nurse Practitioner

## 2017-10-08 ENCOUNTER — Ambulatory Visit (INDEPENDENT_AMBULATORY_CARE_PROVIDER_SITE_OTHER): Payer: Medicare Other | Admitting: Nurse Practitioner

## 2017-10-08 ENCOUNTER — Other Ambulatory Visit (INDEPENDENT_AMBULATORY_CARE_PROVIDER_SITE_OTHER): Payer: Medicare Other

## 2017-10-08 VITALS — BP 138/82 | HR 64 | Temp 97.5°F | Ht 64.0 in | Wt 178.0 lb

## 2017-10-08 DIAGNOSIS — K148 Other diseases of tongue: Secondary | ICD-10-CM | POA: Diagnosis not present

## 2017-10-08 LAB — HEPATIC FUNCTION PANEL
ALT: 16 U/L (ref 0–35)
AST: 17 U/L (ref 0–37)
Albumin: 4.3 g/dL (ref 3.5–5.2)
Alkaline Phosphatase: 60 U/L (ref 39–117)
Bilirubin, Direct: 0.1 mg/dL (ref 0.0–0.3)
Total Bilirubin: 0.7 mg/dL (ref 0.2–1.2)
Total Protein: 6.6 g/dL (ref 6.0–8.3)

## 2017-10-08 MED ORDER — NYSTATIN 100000 UNIT/ML MT SUSP
5.0000 mL | Freq: Four times a day (QID) | OROMUCOSAL | 0 refills | Status: DC
Start: 2017-10-08 — End: 2018-07-04

## 2017-10-08 NOTE — Patient Instructions (Signed)
Use tooth paste with baking soda and peroxide.  Call office for referral to ENT if no improvement in 1week.

## 2017-10-08 NOTE — Progress Notes (Signed)
Subjective:  Patient ID: Anna Ellis, female    DOB: 1951-05-22  Age: 66 y.o. MRN: [id number]  CC: Follow-up (tongue turn yellow-inside burning--going on for 1 mo--took prednison. just had colonscopy--thinking it might the stuff she drinks. )  HPI  Tongue Discoloration: Onset 40month ago. Has not chnaged. Denies any new food item or medications. No GERD symptoms, no hoarseness, no sorethroat, no dysphagia, no change in voice, no tobacco use, no burning sensation in mouth or tongue. No improvement with mouth was or brushing. She will like to try a topical antifungal.  Outpatient Medications Prior to Visit  Medication Sig Dispense Refill  . acyclovir (ZOVIRAX) 800 MG tablet Take 1 tablet (800 mg total) by mouth 5 (five) times daily. Reported on 03/21/2016 35 tablet 1  . ALPRAZolam (XANAX) 0.25 MG tablet Take 1 tablet (0.25 mg total) by mouth 2 (two) times daily as needed. 30 tablet 1  . aspirin 81 MG chewable tablet Chew 81 mg by mouth daily.      Marland Kitchen atorvastatin (LIPITOR) 20 MG tablet Take 1 tablet (20 mg total) by mouth daily. 90 tablet 1  . Cholecalciferol (VITAMIN D3) 2000 units TABS Take by mouth.    . etodolac (LODINE) 500 MG tablet TAKE 1 TABLET BY MOUTH TWICE DAILY AS NEEDED WITH FOOD 180 tablet 2  . metroNIDAZOLE (METROCREAM) 0.75 % cream Reported on 03/21/2016  3  . mometasone (NASONEX) 50 MCG/ACT nasal spray Place 2 sprays into the nose daily. 17 g 3  . montelukast (SINGULAIR) 10 MG tablet Take 1 tablet (10 mg total) by mouth daily. 90 tablet 3  . omeprazole (PRILOSEC) 20 MG capsule TAKE 1 CAPSULE (20 MG TOTAL) BY MOUTH 2 (TWO) TIMES DAILY BEFORE A MEAL. WHITE CAPS PLEASE 60 capsule 3  . cefUROXime (CEFTIN) 500 MG tablet Take 1 tablet (500 mg total) by mouth 2 (two) times daily. 42 tablet 0  . predniSONE (STERAPRED UNI-PAK 21 TAB) 10 MG (21) TBPK tablet Take 6 tablets x 1 day, 5 tablets x 1 day, 4 tablets x 1 day, 3 tablets x 1 day, 2 tablets x 1 day, 1 tablet x 1 day 21 tablet  0   No facility-administered medications prior to visit.     ROS See HPI  Objective:  BP 138/82   Pulse 64   Temp (!) 97.5 F (36.4 C)   Ht 5\' 4"  (1.626 m)   Wt 178 lb (80.7 kg)   SpO2 98%   BMI 30.55 kg/m   BP Readings from Last 3 Encounters:  10/08/17 138/82  08/05/17 132/76  07/22/17 (!) 146/82    Wt Readings from Last 3 Encounters:  10/08/17 178 lb (80.7 kg)  08/05/17 180 lb (81.6 kg)  07/22/17 181 lb 6.4 oz (82.3 kg)    Physical Exam  Constitutional: She is oriented to person, place, and time. No distress.  HENT:  Right Ear: External ear normal.  Left Ear: External ear normal.  Nose: Nose normal.  Mouth/Throat: Uvula is midline and oropharynx is clear and moist. No oral lesions. No trismus in the jaw. No oropharyngeal exudate or posterior oropharyngeal erythema.    Neck: No thyromegaly present.  Cardiovascular: Normal rate.   Pulmonary/Chest: Effort normal.  Lymphadenopathy:    She has no cervical adenopathy.  Neurological: She is alert and oriented to person, place, and time.  Skin: Skin is warm and dry. No rash noted. No erythema.  Vitals reviewed.   Lab Results  Component Value Date  WBC 4.5 03/28/2017   HGB 14.5 03/28/2017   HCT 43.0 03/28/2017   PLT 378.0 03/28/2017   GLUCOSE 92 03/28/2017   CHOL 154 03/28/2017   TRIG 89.0 03/28/2017   HDL 65.20 03/28/2017   LDLCALC 71 03/28/2017   ALT 16 10/08/2017   AST 17 10/08/2017   NA 141 03/28/2017   K 4.3 03/28/2017   CL 106 03/28/2017   CREATININE 0.78 03/28/2017   BUN 16 03/28/2017   CO2 29 03/28/2017   TSH 2.36 03/28/2017    Dg Bone Density  Result Date: 04/01/2016 Date of study: 03/26/16 Exam: DUAL X-RAY ABSORPTIOMETRY (DXA) FOR BONE MINERAL DENSITY (BMD) Instrument: [employer] Provider: PCP Indication: follow up for low BMD Comparison: none (please note that it is not possible to compare data from different instruments) Clinical data: Pt is a postmenopausal 66 y.o.  female with previous h/o fracture. On calcium and vitamin D. Results:  Lumbar spine (L1-L4) Femoral neck (FN) 33% distal radius T-score -1.4 RFN:-1.4 LFN:-1.0 n/a Change in BMD from previous DXA test (%) Down 11.8% Down 6.7% n/a (*) statistically significant Assessment: the BMD is low according to the [facility] classification for osteoporosis (see below). Fracture risk: moderate FRAX score: 10 year major osteoporotic risk: 14.3%. 10 year hip fracture risk: 1.6%. These are under the thresholds for treatment of 20% and 3%, respectively. Comments: the technical quality of the study is good.  A reported spinal block noted in L2-L3 may falsely affect spine score/BMD Evaluation for secondary causes should be considered if clinically indicated. Recommend optimizing calcium (1200 mg/day) and vitamin D (800 IU/day) intake. Followup: Repeat BMD is appropriate after 2 years or after 1-2 years if starting treatment. WHO criteria for diagnosis of osteoporosis in postmenopausal women and in men 75 y/o or older: - normal: T-score -1.0 to + 1.0 - osteopenia/low bone density: T-score between -2.5 and -1.0 - osteoporosis: T-score below -2.5 - severe osteoporosis: T-score below -2.5 with history of fragility fracture Note: although not part of the WHO classification, the presence of a fragility fracture, regardless of the T-score, should be considered diagnostic of osteoporosis, provided other causes for the fracture have been excluded. Treatment: The National Osteoporosis Foundation recommends that treatment be considered in postmenopausal women and men age 58 or older with: 1. Hip or vertebral (clinical or morphometric) fracture 2. T-score of - 2.5 or lower at the spine or hip 3. 10-year fracture probability by FRAX of at least 20% for a major osteoporotic fracture and 3% for a hip fracture [name]    Assessment & Plan:   Millie was seen today for follow-up.  Diagnoses and all orders for this visit:  Tongue  discoloration -     nystatin (MYCOSTATIN) 100000 UNIT/ML suspension; Take 5 mLs (500,000 Units total) by mouth 4 (four) times daily. -     Hepatic function panel; Future   I have discontinued Ms. Luchsinger's cefUROXime and predniSONE. I am also having her start on nystatin. Additionally, I am having her maintain her aspirin, metroNIDAZOLE, omeprazole, Vitamin D3, ALPRAZolam, etodolac, atorvastatin, acyclovir, montelukast, and mometasone.  Meds ordered this encounter  Medications  . nystatin (MYCOSTATIN) 100000 UNIT/ML suspension    Sig: Take 5 mLs (500,000 Units total) by mouth 4 (four) times daily.    Dispense:  100 mL    Refill:  0    Order Specific Question:   Supervising Provider    Answer:   Cassandria Anger [[id number]]    Follow-up: Return if  symptoms worsen or fail to improve.  Wilfred Lacy, NP

## 2017-10-30 ENCOUNTER — Encounter: Payer: Self-pay | Admitting: Internal Medicine

## 2017-10-30 ENCOUNTER — Ambulatory Visit (INDEPENDENT_AMBULATORY_CARE_PROVIDER_SITE_OTHER): Payer: Medicare Other | Admitting: Internal Medicine

## 2017-10-30 VITALS — BP 134/82 | HR 71 | Temp 98.0°F | Ht 64.0 in | Wt 177.0 lb

## 2017-10-30 DIAGNOSIS — K148 Other diseases of tongue: Secondary | ICD-10-CM | POA: Diagnosis not present

## 2017-10-30 DIAGNOSIS — Z23 Encounter for immunization: Secondary | ICD-10-CM | POA: Diagnosis not present

## 2017-10-30 DIAGNOSIS — J45991 Cough variant asthma: Secondary | ICD-10-CM

## 2017-10-30 NOTE — Progress Notes (Signed)
Subjective:  Patient ID: Anna Ellis, female    DOB: Dec 21, 1950  Age: 66 y.o. MRN: [id number]  CC: No chief complaint on file.   HPI Anna Ellis presents for tongue discoloration - orange. Stopped Vit d - no difference Burning was cured by antifungal medicine   Outpatient Medications Prior to Visit  Medication Sig Dispense Refill  . acyclovir (ZOVIRAX) 800 MG tablet Take 1 tablet (800 mg total) by mouth 5 (five) times daily. Reported on 03/21/2016 35 tablet 1  . ALPRAZolam (XANAX) 0.25 MG tablet Take 1 tablet (0.25 mg total) by mouth 2 (two) times daily as needed. 30 tablet 1  . aspirin 81 MG chewable tablet Chew 81 mg by mouth daily.      Marland Kitchen atorvastatin (LIPITOR) 20 MG tablet Take 1 tablet (20 mg total) by mouth daily. 90 tablet 1  . Cholecalciferol (VITAMIN D3) 2000 units TABS Take by mouth.    . etodolac (LODINE) 500 MG tablet TAKE 1 TABLET BY MOUTH TWICE DAILY AS NEEDED WITH FOOD 180 tablet 2  . metroNIDAZOLE (METROCREAM) 0.75 % cream Reported on 03/21/2016  3  . mometasone (NASONEX) 50 MCG/ACT nasal spray Place 2 sprays into the nose daily. 17 g 3  . montelukast (SINGULAIR) 10 MG tablet Take 1 tablet (10 mg total) by mouth daily. 90 tablet 3  . nystatin (MYCOSTATIN) 100000 UNIT/ML suspension Take 5 mLs (500,000 Units total) by mouth 4 (four) times daily. 100 mL 0  . omeprazole (PRILOSEC) 20 MG capsule TAKE 1 CAPSULE (20 MG TOTAL) BY MOUTH 2 (TWO) TIMES DAILY BEFORE A MEAL. WHITE CAPS PLEASE 60 capsule 3   No facility-administered medications prior to visit.     ROS Review of Systems  Constitutional: Negative for activity change, appetite change, chills, fatigue and unexpected weight change.  HENT: Negative for congestion, mouth sores and sinus pressure.   Eyes: Negative for visual disturbance.  Respiratory: Negative for cough and chest tightness.   Gastrointestinal: Negative for abdominal pain and nausea.  Genitourinary: Negative for difficulty urinating, frequency and  vaginal pain.  Musculoskeletal: Negative for back pain and gait problem.  Skin: Negative for pallor and rash.  Neurological: Negative for dizziness, tremors, weakness, numbness and headaches.  Psychiatric/Behavioral: Negative for confusion and sleep disturbance.    Objective:  BP 134/82 (BP Location: Left Arm, Patient Position: Sitting, Cuff Size: Large)   Pulse 71   Temp 98 F (36.7 C) (Oral)   Ht 5\' 4"  (1.626 m)   Wt 177 lb (80.3 kg)   SpO2 98%   BMI 30.38 kg/m   BP Readings from Last 3 Encounters:  10/30/17 134/82  10/08/17 138/82  08/05/17 132/76    Wt Readings from Last 3 Encounters:  10/30/17 177 lb (80.3 kg)  10/08/17 178 lb (80.7 kg)  08/05/17 180 lb (81.6 kg)    Physical Exam  Constitutional: She appears well-developed. No distress.  HENT:  Head: Normocephalic.  Right Ear: External ear normal.  Left Ear: External ear normal.  Nose: Nose normal.  Mouth/Throat: Oropharynx is clear and moist.  Eyes: Conjunctivae are normal. Pupils are equal, round, and reactive to light. Right eye exhibits no discharge. Left eye exhibits no discharge.  Neck: Normal range of motion. Neck supple. No JVD present. No tracheal deviation present. No thyromegaly present.  Cardiovascular: Normal rate, regular rhythm and normal heart sounds.  Pulmonary/Chest: No stridor. No respiratory distress. She has no wheezes.  Abdominal: Soft. Bowel sounds are normal. She exhibits no distension and  no mass. There is no tenderness. There is no rebound and no guarding.  Musculoskeletal: She exhibits no edema or tenderness.  Lymphadenopathy:    She has no cervical adenopathy.  Neurological: She displays normal reflexes. No cranial nerve deficit. She exhibits normal muscle tone. Coordination normal.  Skin: No rash noted. No erythema.  Psychiatric: She has a normal mood and affect. Her behavior is normal. Judgment and thought content normal.    Lab Results  Component Value Date   WBC 4.5 03/28/2017     HGB 14.5 03/28/2017   HCT 43.0 03/28/2017   PLT 378.0 03/28/2017   GLUCOSE 92 03/28/2017   CHOL 154 03/28/2017   TRIG 89.0 03/28/2017   HDL 65.20 03/28/2017   LDLCALC 71 03/28/2017   ALT 16 10/08/2017   AST 17 10/08/2017   NA 141 03/28/2017   K 4.3 03/28/2017   CL 106 03/28/2017   CREATININE 0.78 03/28/2017   BUN 16 03/28/2017   CO2 29 03/28/2017   TSH 2.36 03/28/2017    Dg Bone Density  Result Date: 04/01/2016 Date of study: 03/26/16 Exam: DUAL X-RAY ABSORPTIOMETRY (DXA) FOR BONE MINERAL DENSITY (BMD) Instrument: [employer] Provider: PCP Indication: follow up for low BMD Comparison: none (please note that it is not possible to compare data from different instruments) Clinical data: Pt is a postmenopausal 66 y.o. female with previous h/o fracture. On calcium and vitamin D. Results:  Lumbar spine (L1-L4) Femoral neck (FN) 33% distal radius T-score -1.4 RFN:-1.4 LFN:-1.0 n/a Change in BMD from previous DXA test (%) Down 11.8% Down 6.7% n/a (*) statistically significant Assessment: the BMD is low according to the [facility] classification for osteoporosis (see below). Fracture risk: moderate FRAX score: 10 year major osteoporotic risk: 14.3%. 10 year hip fracture risk: 1.6%. These are under the thresholds for treatment of 20% and 3%, respectively. Comments: the technical quality of the study is good.  A reported spinal block noted in L2-L3 may falsely affect spine score/BMD Evaluation for secondary causes should be considered if clinically indicated. Recommend optimizing calcium (1200 mg/day) and vitamin D (800 IU/day) intake. Followup: Repeat BMD is appropriate after 2 years or after 1-2 years if starting treatment. WHO criteria for diagnosis of osteoporosis in postmenopausal women and in men 3 y/o or older: - normal: T-score -1.0 to + 1.0 - osteopenia/low bone density: T-score between -2.5 and -1.0 - osteoporosis: T-score below -2.5 - severe osteoporosis: T-score below -2.5 with  history of fragility fracture Note: although not part of the WHO classification, the presence of a fragility fracture, regardless of the T-score, should be considered diagnostic of osteoporosis, provided other causes for the fracture have been excluded. Treatment: The National Osteoporosis Foundation recommends that treatment be considered in postmenopausal women and men age 23 or older with: 1. Hip or vertebral (clinical or morphometric) fracture 2. T-score of - 2.5 or lower at the spine or hip 3. 10-year fracture probability by FRAX of at least 20% for a major osteoporotic fracture and 3% for a hip fracture [name]    Assessment & Plan:   There are no diagnoses linked to this encounter. I am having Alois Cliche maintain her aspirin, metroNIDAZOLE, omeprazole, Vitamin D3, ALPRAZolam, etodolac, atorvastatin, acyclovir, montelukast, mometasone, and nystatin.  No orders of the defined types were placed in this encounter.    Follow-up: No Follow-up on file.  Walker Kehr, MD

## 2017-10-30 NOTE — Patient Instructions (Addendum)
Hold Lipitor x 1 month  Re-start Vitamin D  ADVERSE DRUG REACTIONS IN THE OROFACIAL REGION https://www.google.com/url?sa=t&rct=j&q=&esrc=s&source=web&cd=21&cad=rja&uact=8&ved=[email or web address]&[email or web address].[email or web address]

## 2017-11-19 DIAGNOSIS — K148 Other diseases of tongue: Secondary | ICD-10-CM | POA: Insufficient documentation

## 2017-11-19 NOTE — Assessment & Plan Note (Addendum)
?  etiology Discussed potential etiology and options to correct Hold Lipitor x 1 month Re-start Vitamin D

## 2017-11-19 NOTE — Assessment & Plan Note (Signed)
In remission.

## 2018-04-15 ENCOUNTER — Encounter: Payer: Medicare Other | Admitting: Internal Medicine

## 2018-05-01 ENCOUNTER — Ambulatory Visit (INDEPENDENT_AMBULATORY_CARE_PROVIDER_SITE_OTHER): Payer: Medicare Other | Admitting: Internal Medicine

## 2018-05-01 ENCOUNTER — Other Ambulatory Visit: Payer: Self-pay | Admitting: Internal Medicine

## 2018-05-01 ENCOUNTER — Other Ambulatory Visit (INDEPENDENT_AMBULATORY_CARE_PROVIDER_SITE_OTHER): Payer: Medicare Other

## 2018-05-01 ENCOUNTER — Other Ambulatory Visit: Payer: Self-pay

## 2018-05-01 ENCOUNTER — Encounter: Payer: Self-pay | Admitting: Internal Medicine

## 2018-05-01 VITALS — BP 126/82 | HR 68 | Temp 98.2°F | Ht 64.0 in | Wt 173.0 lb

## 2018-05-01 DIAGNOSIS — F411 Generalized anxiety disorder: Secondary | ICD-10-CM | POA: Diagnosis not present

## 2018-05-01 DIAGNOSIS — Z0000 Encounter for general adult medical examination without abnormal findings: Secondary | ICD-10-CM

## 2018-05-01 DIAGNOSIS — K148 Other diseases of tongue: Secondary | ICD-10-CM

## 2018-05-01 DIAGNOSIS — K219 Gastro-esophageal reflux disease without esophagitis: Secondary | ICD-10-CM

## 2018-05-01 LAB — CBC WITH DIFFERENTIAL/PLATELET
Basophils Absolute: 0.1 10*3/uL (ref 0.0–0.1)
Basophils Relative: 0.9 % (ref 0.0–3.0)
Eosinophils Absolute: 0.3 10*3/uL (ref 0.0–0.7)
Eosinophils Relative: 5.4 % — ABNORMAL HIGH (ref 0.0–5.0)
HCT: 43.4 % (ref 36.0–46.0)
Hemoglobin: 14.7 g/dL (ref 12.0–15.0)
Lymphocytes Relative: 24.6 % (ref 12.0–46.0)
Lymphs Abs: 1.4 10*3/uL (ref 0.7–4.0)
MCHC: 33.8 g/dL (ref 30.0–36.0)
MCV: 92.9 fl (ref 78.0–100.0)
Monocytes Absolute: 0.5 10*3/uL (ref 0.1–1.0)
Monocytes Relative: 9.5 % (ref 3.0–12.0)
Neutro Abs: 3.3 10*3/uL (ref 1.4–7.7)
Neutrophils Relative %: 59.6 % (ref 43.0–77.0)
Platelets: 386 10*3/uL (ref 150.0–400.0)
RBC: 4.67 Mil/uL (ref 3.87–5.11)
RDW: 13.5 % (ref 11.5–15.5)
WBC: 5.5 10*3/uL (ref 4.0–10.5)

## 2018-05-01 LAB — BASIC METABOLIC PANEL
BUN: 17 mg/dL (ref 6–23)
CO2: 29 mEq/L (ref 19–32)
Calcium: 9.8 mg/dL (ref 8.4–10.5)
Chloride: 105 mEq/L (ref 96–112)
Creatinine, Ser: 0.77 mg/dL (ref 0.40–1.20)
GFR: 79.03 mL/min (ref >60.00)
Glucose, Bld: 94 mg/dL (ref 70–99)
Potassium: 4.2 mEq/L (ref 3.5–5.1)
Sodium: 141 mEq/L (ref 135–145)

## 2018-05-01 LAB — URINALYSIS, ROUTINE W REFLEX MICROSCOPIC
Ketones, ur: NEGATIVE
Leukocytes, UA: NEGATIVE
Nitrite: NEGATIVE
Specific Gravity, Urine: 1.02 (ref 1.000–1.030)
Total Protein, Urine: NEGATIVE
Urine Glucose: NEGATIVE
Urobilinogen, UA: 0.2 (ref 0.0–1.0)
WBC, UA: NONE SEEN (ref 0–?)
pH: 6 (ref 5.0–8.0)

## 2018-05-01 LAB — LIPID PANEL
Cholesterol: 167 mg/dL (ref 0–200)
HDL: 59.9 mg/dL (ref >39.00)
LDL Cholesterol: 88 mg/dL (ref 0–99)
NonHDL: 106.63
Total CHOL/HDL Ratio: 3
Triglycerides: 93 mg/dL (ref 0.0–149.0)
VLDL: 18.6 mg/dL (ref 0.0–40.0)

## 2018-05-01 LAB — H. PYLORI ANTIBODY, IGG: H Pylori IgG: NEGATIVE

## 2018-05-01 LAB — HEPATIC FUNCTION PANEL
ALT: 19 U/L (ref 0–35)
AST: 19 U/L (ref 0–37)
Albumin: 4.5 g/dL (ref 3.5–5.2)
Alkaline Phosphatase: 68 U/L (ref 39–117)
Bilirubin, Direct: 0.1 mg/dL (ref 0.0–0.3)
Total Bilirubin: 0.5 mg/dL (ref 0.2–1.2)
Total Protein: 7.2 g/dL (ref 6.0–8.3)

## 2018-05-01 MED ORDER — CEFUROXIME AXETIL 250 MG PO TABS: 500.0000 mg | ORAL_TABLET | Freq: Two times a day (BID) | ORAL | 1 refills | Status: AC

## 2018-05-01 MED ORDER — MONTELUKAST SODIUM 10 MG PO TABS: 10.0000 mg | ORAL_TABLET | Freq: Every day | ORAL | 3 refills | Status: DC

## 2018-05-01 MED ORDER — ZOSTER VAC RECOMB ADJUVANTED 50 MCG/0.5ML IM SUSR: 0.5000 mL | Freq: Once | INTRAMUSCULAR | 1 refills | Status: AC

## 2018-05-01 MED ORDER — PREDNISONE 10 MG PO TABS
ORAL_TABLET | ORAL | 0 refills | Status: DC
Start: 2018-05-01 — End: 2019-06-23

## 2018-05-01 MED ORDER — ALPRAZOLAM 0.25 MG PO TABS: 0.2500 mg | ORAL_TABLET | Freq: Two times a day (BID) | ORAL | 1 refills | Status: DC | PRN

## 2018-05-01 MED ORDER — ATORVASTATIN CALCIUM 20 MG PO TABS: 20.0000 mg | ORAL_TABLET | Freq: Every day | ORAL | 3 refills | Status: DC

## 2018-05-01 MED ORDER — OMEPRAZOLE 20 MG PO CPDR: 20.0000 mg | DELAYED_RELEASE_CAPSULE | Freq: Two times a day (BID) | ORAL | 11 refills | Status: DC

## 2018-05-01 MED ORDER — ETODOLAC 500 MG PO TABS: ORAL_TABLET | ORAL | 3 refills | Status: DC

## 2018-05-01 NOTE — Progress Notes (Signed)
Subjective:  Patient ID: Anna Ellis, female    DOB: 1951/05/09  Age: 67 y.o. MRN: [id number]  CC: No chief complaint on file.   HPI Anna Ellis presents for a well exam C/o "orange tongue" at times - once resolved w/abx. Color would fluctuate  Outpatient Medications Prior to Visit  Medication Sig Dispense Refill  . acyclovir (ZOVIRAX) 800 MG tablet Take 1 tablet (800 mg total) by mouth 5 (five) times daily. Reported on 03/21/2016 35 tablet 1  . ALPRAZolam (XANAX) 0.25 MG tablet Take 1 tablet (0.25 mg total) by mouth 2 (two) times daily as needed. 30 tablet 1  . aspirin 81 MG chewable tablet Chew 81 mg by mouth daily.      Marland Kitchen atorvastatin (LIPITOR) 20 MG tablet Take 1 tablet (20 mg total) by mouth daily. 90 tablet 1  . Cholecalciferol (VITAMIN D3) 2000 units TABS Take by mouth.    . etodolac (LODINE) 500 MG tablet TAKE 1 TABLET BY MOUTH TWICE DAILY AS NEEDED WITH FOOD 180 tablet 2  . metroNIDAZOLE (METROCREAM) 0.75 % cream Reported on 03/21/2016  3  . mometasone (NASONEX) 50 MCG/ACT nasal spray Place 2 sprays into the nose daily. 17 g 3  . montelukast (SINGULAIR) 10 MG tablet Take 1 tablet (10 mg total) by mouth daily. 90 tablet 3  . nystatin (MYCOSTATIN) 100000 UNIT/ML suspension Take 5 mLs (500,000 Units total) by mouth 4 (four) times daily. 100 mL 0  . omeprazole (PRILOSEC) 20 MG capsule TAKE 1 CAPSULE (20 MG TOTAL) BY MOUTH 2 (TWO) TIMES DAILY BEFORE A MEAL. WHITE CAPS PLEASE 60 capsule 3   No facility-administered medications prior to visit.     ROS Review of Systems  Constitutional: Negative for activity change, appetite change, chills, fatigue and unexpected weight change.  HENT: Negative for congestion, mouth sores and sinus pressure.   Eyes: Negative for visual disturbance.  Respiratory: Negative for cough and chest tightness.   Gastrointestinal: Negative for abdominal pain and nausea.  Genitourinary: Negative for difficulty urinating, frequency and vaginal pain.    Musculoskeletal: Negative for back pain and gait problem.  Skin: Negative for pallor and rash.  Neurological: Negative for dizziness, tremors, weakness, numbness and headaches.  Psychiatric/Behavioral: Negative for confusion, decreased concentration, sleep disturbance and suicidal ideas. The patient is nervous/anxious.     Objective:  BP 126/82 (BP Location: Left Arm, Patient Position: Sitting, Cuff Size: Large)   Pulse 68   Temp 98.2 F (36.8 C) (Oral)   Ht 5\' 4"  (1.626 m)   Wt 173 lb (78.5 kg)   SpO2 98%   BMI 29.70 kg/m   BP Readings from Last 3 Encounters:  05/01/18 126/82  10/30/17 134/82  10/08/17 138/82    Wt Readings from Last 3 Encounters:  05/01/18 173 lb (78.5 kg)  10/30/17 177 lb (80.3 kg)  10/08/17 178 lb (80.7 kg)    Physical Exam  Constitutional: She appears well-developed. No distress.  HENT:  Head: Normocephalic.  Right Ear: External ear normal.  Left Ear: External ear normal.  Nose: Nose normal.  Mouth/Throat: Oropharynx is clear and moist.  Eyes: Pupils are equal, round, and reactive to light. Conjunctivae are normal. Right eye exhibits no discharge. Left eye exhibits no discharge.  Neck: Normal range of motion. Neck supple. No JVD present. No tracheal deviation present. No thyromegaly present.  Cardiovascular: Normal rate, regular rhythm and normal heart sounds.  Pulmonary/Chest: No stridor. No respiratory distress. She has no wheezes.  Abdominal: Soft. Bowel  sounds are normal. She exhibits no distension and no mass. There is no tenderness. There is no rebound and no guarding.  Musculoskeletal: She exhibits no edema or tenderness.  Lymphadenopathy:    She has no cervical adenopathy.  Neurological: She displays normal reflexes. No cranial nerve deficit. She exhibits normal muscle tone. Coordination normal.  Skin: No rash noted. No erythema.  Psychiatric: She has a normal mood and affect. Her behavior is normal. Judgment and thought content normal.    Tongue - tan coating - looks normal  Lab Results  Component Value Date   WBC 4.5 03/28/2017   HGB 14.5 03/28/2017   HCT 43.0 03/28/2017   PLT 378.0 03/28/2017   GLUCOSE 92 03/28/2017   CHOL 154 03/28/2017   TRIG 89.0 03/28/2017   HDL 65.20 03/28/2017   LDLCALC 71 03/28/2017   ALT 16 10/08/2017   AST 17 10/08/2017   NA 141 03/28/2017   K 4.3 03/28/2017   CL 106 03/28/2017   CREATININE 0.78 03/28/2017   BUN 16 03/28/2017   CO2 29 03/28/2017   TSH 2.36 03/28/2017    Dg Bone Density  Result Date: 04/01/2016 Date of study: 03/26/16 Exam: DUAL X-RAY ABSORPTIOMETRY (DXA) FOR BONE MINERAL DENSITY (BMD) Instrument: [employer] Provider: PCP Indication: follow up for low BMD Comparison: none (please note that it is not possible to compare data from different instruments) Clinical data: Pt is a postmenopausal 67 y.o. female with previous h/o fracture. On calcium and vitamin D. Results:  Lumbar spine (L1-L4) Femoral neck (FN) 33% distal radius T-score -1.4 RFN:-1.4 LFN:-1.0 n/a Change in BMD from previous DXA test (%) Down 11.8% Down 6.7% n/a (*) statistically significant Assessment: the BMD is low according to the [facility] classification for osteoporosis (see below). Fracture risk: moderate FRAX score: 10 year major osteoporotic risk: 14.3%. 10 year hip fracture risk: 1.6%. These are under the thresholds for treatment of 20% and 3%, respectively. Comments: the technical quality of the study is good.  A reported spinal block noted in L2-L3 may falsely affect spine score/BMD Evaluation for secondary causes should be considered if clinically indicated. Recommend optimizing calcium (1200 mg/day) and vitamin D (800 IU/day) intake. Followup: Repeat BMD is appropriate after 2 years or after 1-2 years if starting treatment. WHO criteria for diagnosis of osteoporosis in postmenopausal women and in men 56 y/o or older: - normal: T-score -1.0 to + 1.0 - osteopenia/low bone density: T-score  between -2.5 and -1.0 - osteoporosis: T-score below -2.5 - severe osteoporosis: T-score below -2.5 with history of fragility fracture Note: although not part of the WHO classification, the presence of a fragility fracture, regardless of the T-score, should be considered diagnostic of osteoporosis, provided other causes for the fracture have been excluded. Treatment: The National Osteoporosis Foundation recommends that treatment be considered in postmenopausal women and men age 36 or older with: 1. Hip or vertebral (clinical or morphometric) fracture 2. T-score of - 2.5 or lower at the spine or hip 3. 10-year fracture probability by FRAX of at least 20% for a major osteoporotic fracture and 3% for a hip fracture [name]    Assessment & Plan:   There are no diagnoses linked to this encounter. I am having Alois Cliche maintain her aspirin, metroNIDAZOLE, omeprazole, Vitamin D3, ALPRAZolam, etodolac, atorvastatin, acyclovir, montelukast, mometasone, and nystatin.  No orders of the defined types were placed in this encounter.    Follow-up: No follow-ups on file.  Walker Kehr, MD

## 2018-05-01 NOTE — Assessment & Plan Note (Signed)
Omeprazole and TUMs H pylori

## 2018-05-01 NOTE — Assessment & Plan Note (Signed)
Xanax prn  Potential benefits of a long term benzodiazepines  use as well as potential risks  and complications were explained to the patient and were aknowledged. 

## 2018-05-01 NOTE — Assessment & Plan Note (Signed)
Orange tongue

## 2018-05-01 NOTE — Patient Instructions (Signed)
Health Maintenance for Postmenopausal Women Menopause is a normal process in which your reproductive ability comes to an end. This process happens gradually over a span of months to years, usually between the ages of 22 and 9. Menopause is complete when you have missed 12 consecutive menstrual periods. It is important to talk with your health care provider about some of the most common conditions that affect postmenopausal women, such as heart disease, cancer, and bone loss (osteoporosis). Adopting a healthy lifestyle and getting preventive care can help to promote your health and wellness. Those actions can also lower your chances of developing some of these common conditions. What should I know about menopause? During menopause, you may experience a number of symptoms, such as:  Moderate-to-severe hot flashes.  Night sweats.  Decrease in sex drive.  Mood swings.  Headaches.  Tiredness.  Irritability.  Memory problems.  Insomnia.  Choosing to treat or not to treat menopausal changes is an individual decision that you make with your health care provider. What should I know about hormone replacement therapy and supplements? Hormone therapy products are effective for treating symptoms that are associated with menopause, such as hot flashes and night sweats. Hormone replacement carries certain risks, especially as you become older. If you are thinking about using estrogen or estrogen with progestin treatments, discuss the benefits and risks with your health care provider. What should I know about heart disease and stroke? Heart disease, heart attack, and stroke become more likely as you age. This may be due, in part, to the hormonal changes that your body experiences during menopause. These can affect how your body processes dietary fats, triglycerides, and cholesterol. Heart attack and stroke are both medical emergencies. There are many things that you can do to help prevent heart disease  and stroke:  Have your blood pressure checked at least every 1-2 years. High blood pressure causes heart disease and increases the risk of stroke.  If you are 53-22 years old, ask your health care provider if you should take aspirin to prevent a heart attack or a stroke.  Do not use any tobacco products, including cigarettes, chewing tobacco, or electronic cigarettes. If you need help quitting, ask your health care provider.  It is important to eat a healthy diet and maintain a healthy weight. ? Be sure to include plenty of vegetables, fruits, low-fat dairy products, and lean protein. ? Avoid eating foods that are high in solid fats, added sugars, or salt (sodium).  Get regular exercise. This is one of the most important things that you can do for your health. ? Try to exercise for at least 150 minutes each week. The type of exercise that you do should increase your heart rate and make you sweat. This is known as moderate-intensity exercise. ? Try to do strengthening exercises at least twice each week. Do these in addition to the moderate-intensity exercise.  Know your numbers.Ask your health care provider to check your cholesterol and your blood glucose. Continue to have your blood tested as directed by your health care provider.  What should I know about cancer screening? There are several types of cancer. Take the following steps to reduce your risk and to catch any cancer development as early as possible. Breast Cancer  Practice breast self-awareness. ? This means understanding how your breasts normally appear and feel. ? It also means doing regular breast self-exams. Let your health care provider know about any changes, no matter how small.  If you are 40  or older, have a clinician do a breast exam (clinical breast exam or CBE) every year. Depending on your age, family history, and medical history, it may be recommended that you also have a yearly breast X-ray (mammogram).  If you  have a family history of breast cancer, talk with your health care provider about genetic screening.  If you are at high risk for breast cancer, talk with your health care provider about having an MRI and a mammogram every year.  Breast cancer (BRCA) gene test is recommended for women who have family members with BRCA-related cancers. Results of the assessment will determine the need for genetic counseling and BRCA1 and for BRCA2 testing. BRCA-related cancers include these types: ? Breast. This occurs in males or females. ? Ovarian. ? Tubal. This may also be called fallopian tube cancer. ? Cancer of the abdominal or pelvic lining (peritoneal cancer). ? Prostate. ? Pancreatic.  Cervical, Uterine, and Ovarian Cancer Your health care provider may recommend that you be screened regularly for cancer of the pelvic organs. These include your ovaries, uterus, and vagina. This screening involves a pelvic exam, which includes checking for microscopic changes to the surface of your cervix (Pap test).  For women ages 21-65, health care providers may recommend a pelvic exam and a Pap test every three years. For women ages 79-65, they may recommend the Pap test and pelvic exam, combined with testing for human papilloma virus (HPV), every five years. Some types of HPV increase your risk of cervical cancer. Testing for HPV may also be done on women of any age who have unclear Pap test results.  Other health care providers may not recommend any screening for nonpregnant women who are considered low risk for pelvic cancer and have no symptoms. Ask your health care provider if a screening pelvic exam is right for you.  If you have had past treatment for cervical cancer or a condition that could lead to cancer, you need Pap tests and screening for cancer for at least 20 years after your treatment. If Pap tests have been discontinued for you, your risk factors (such as having a new sexual partner) need to be  reassessed to determine if you should start having screenings again. Some women have medical problems that increase the chance of getting cervical cancer. In these cases, your health care provider may recommend that you have screening and Pap tests more often.  If you have a family history of uterine cancer or ovarian cancer, talk with your health care provider about genetic screening.  If you have vaginal bleeding after reaching menopause, tell your health care provider.  There are currently no reliable tests available to screen for ovarian cancer.  Lung Cancer Lung cancer screening is recommended for adults 69-62 years old who are at high risk for lung cancer because of a history of smoking. A yearly low-dose CT scan of the lungs is recommended if you:  Currently smoke.  Have a history of at least 30 pack-years of smoking and you currently smoke or have quit within the past 15 years. A pack-year is smoking an average of one pack of cigarettes per day for one year.  Yearly screening should:  Continue until it has been 15 years since you quit.  Stop if you develop a health problem that would prevent you from having lung cancer treatment.  Colorectal Cancer  This type of cancer can be detected and can often be prevented.  Routine colorectal cancer screening usually begins at  age 42 and continues through age 45.  If you have risk factors for colon cancer, your health care provider may recommend that you be screened at an earlier age.  If you have a family history of colorectal cancer, talk with your health care provider about genetic screening.  Your health care provider may also recommend using home test kits to check for hidden blood in your stool.  A small camera at the end of a tube can be used to examine your colon directly (sigmoidoscopy or colonoscopy). This is done to check for the earliest forms of colorectal cancer.  Direct examination of the colon should be repeated every  5-10 years until age 71. However, if early forms of precancerous polyps or small growths are found or if you have a family history or genetic risk for colorectal cancer, you may need to be screened more often.  Skin Cancer  Check your skin from head to toe regularly.  Monitor any moles. Be sure to tell your health care provider: ? About any new moles or changes in moles, especially if there is a change in a mole's shape or color. ? If you have a mole that is larger than the size of a pencil eraser.  If any of your family members has a history of skin cancer, especially at a young age, talk with your health care provider about genetic screening.  Always use sunscreen. Apply sunscreen liberally and repeatedly throughout the day.  Whenever you are outside, protect yourself by wearing long sleeves, pants, a wide-brimmed hat, and sunglasses.  What should I know about osteoporosis? Osteoporosis is a condition in which bone destruction happens more quickly than new bone creation. After menopause, you may be at an increased risk for osteoporosis. To help prevent osteoporosis or the bone fractures that can happen because of osteoporosis, the following is recommended:  If you are 46-71 years old, get at least 1,000 mg of calcium and at least 600 mg of vitamin D per day.  If you are older than age 55 but younger than age 65, get at least 1,200 mg of calcium and at least 600 mg of vitamin D per day.  If you are older than age 54, get at least 1,200 mg of calcium and at least 800 mg of vitamin D per day.  Smoking and excessive alcohol intake increase the risk of osteoporosis. Eat foods that are rich in calcium and vitamin D, and do weight-bearing exercises several times each week as directed by your health care provider. What should I know about how menopause affects my mental health? Depression may occur at any age, but it is more common as you become older. Common symptoms of depression  include:  Low or sad mood.  Changes in sleep patterns.  Changes in appetite or eating patterns.  Feeling an overall lack of motivation or enjoyment of activities that you previously enjoyed.  Frequent crying spells.  Talk with your health care provider if you think that you are experiencing depression. What should I know about immunizations? It is important that you get and maintain your immunizations. These include:  Tetanus, diphtheria, and pertussis (Tdap) booster vaccine.  Influenza every year before the flu season begins.  Pneumonia vaccine.  Shingles vaccine.  Your health care provider may also recommend other immunizations. This information is not intended to replace advice given to you by your health care provider. Make sure you discuss any questions you have with your health care provider. Document Released: 01/18/2006  Document Revised: 06/15/2016 Document Reviewed: 08/30/2015 Elsevier Interactive Patient Education  2018 [employer]  

## 2018-05-01 NOTE — Assessment & Plan Note (Signed)

## 2018-07-01 ENCOUNTER — Encounter: Payer: Self-pay | Admitting: Internal Medicine

## 2018-07-01 ENCOUNTER — Ambulatory Visit (INDEPENDENT_AMBULATORY_CARE_PROVIDER_SITE_OTHER): Payer: Medicare Other | Admitting: Internal Medicine

## 2018-07-01 ENCOUNTER — Other Ambulatory Visit (INDEPENDENT_AMBULATORY_CARE_PROVIDER_SITE_OTHER): Payer: Medicare Other

## 2018-07-01 VITALS — BP 184/70 | HR 52 | Temp 98.4°F | Ht 64.0 in | Wt 175.0 lb

## 2018-07-01 DIAGNOSIS — R0789 Other chest pain: Secondary | ICD-10-CM | POA: Diagnosis not present

## 2018-07-01 DIAGNOSIS — I441 Atrioventricular block, second degree: Secondary | ICD-10-CM

## 2018-07-01 DIAGNOSIS — R030 Elevated blood-pressure reading, without diagnosis of hypertension: Secondary | ICD-10-CM | POA: Diagnosis not present

## 2018-07-01 LAB — CBC
HCT: 41.3 % (ref 36.0–46.0)
Hemoglobin: 14.1 g/dL (ref 12.0–15.0)
MCHC: 34.1 g/dL (ref 30.0–36.0)
MCV: 93.2 fl (ref 78.0–100.0)
Platelets: 374 10*3/uL (ref 150.0–400.0)
RBC: 4.43 Mil/uL (ref 3.87–5.11)
RDW: 13.3 % (ref 11.5–15.5)
WBC: 7.8 10*3/uL (ref 4.0–10.5)

## 2018-07-01 LAB — COMPREHENSIVE METABOLIC PANEL
ALT: 16 U/L (ref 0–35)
AST: 13 U/L (ref 0–37)
Albumin: 4.2 g/dL (ref 3.5–5.2)
Alkaline Phosphatase: 68 U/L (ref 39–117)
BUN: 21 mg/dL (ref 6–23)
CO2: 27 mEq/L (ref 19–32)
Calcium: 9.2 mg/dL (ref 8.4–10.5)
Chloride: 106 mEq/L (ref 96–112)
Creatinine, Ser: 0.88 mg/dL (ref 0.40–1.20)
GFR: 67.71 mL/min (ref >60.00)
Glucose, Bld: 105 mg/dL — ABNORMAL HIGH (ref 70–99)
Potassium: 3.7 mEq/L (ref 3.5–5.1)
Sodium: 140 mEq/L (ref 135–145)
Total Bilirubin: 0.6 mg/dL (ref 0.2–1.2)
Total Protein: 6.8 g/dL (ref 6.0–8.3)

## 2018-07-01 LAB — TROPONIN I: TNIDX: 0 ug/l (ref 0.00–0.06)

## 2018-07-01 LAB — BRAIN NATRIURETIC PEPTIDE: Pro B Natriuretic peptide (BNP): 24 pg/mL (ref 0.0–100.0)

## 2018-07-01 LAB — TSH: TSH: 2.22 u[IU]/mL (ref 0.35–4.50)

## 2018-07-01 LAB — VITAMIN D 25 HYDROXY (VIT D DEFICIENCY, FRACTURES): VITD: 31.15 ng/mL (ref 30.00–100.00)

## 2018-07-01 NOTE — Patient Instructions (Signed)
You do have a heart block today on the EKG so we will get you in with the cardiologist.   We are checking the blood work today.

## 2018-07-01 NOTE — Progress Notes (Signed)
   Subjective:    Patient ID: Anna Ellis, female    DOB: [birth year], 67 y.o.   MRN: [id number]  HPI The patient is a 67 YO female coming in with concerns about BP and pulse. She is not taking any blood pressure medicines currently. She has been monitoring BP at home intermittently and it is 130-160s/70s. She had an episode while carrying up 20 pound baby up 18 stairs with pressure in her chest and some SOB. She has not had this before and has carried this same child up different stairs many times in the past. She also had another episode with walking up and down hill with some gear and got very SOB and some chest pressure which is unusual for her as well. She is normally able to walk a long time without SOB. She also noticed during this time that her pulse is down in the 40s when checking BP, normally in 60-70s. No new medicines, change in doses, no new illicit substances or taking anyone else's medication. She denies numbness or weakness. Denies dizziness or syncope. Denies palpitations.  Review of Systems  Constitutional: Negative.   HENT: Negative.   Eyes: Negative.   Respiratory: Positive for shortness of breath. Negative for cough and chest tightness.   Cardiovascular: Positive for chest pain. Negative for palpitations and leg swelling.  Gastrointestinal: Negative for abdominal distention, abdominal pain, constipation, diarrhea, nausea and vomiting.  Musculoskeletal: Negative.   Skin: Negative.   Neurological: Negative.   Psychiatric/Behavioral: Negative.       Objective:   Physical Exam  Constitutional: She is oriented to person, place, and time. She appears well-developed and well-nourished.  HENT:  Head: Normocephalic and atraumatic.  Eyes: EOM are normal.  Neck: Normal range of motion.  Cardiovascular: Regular rhythm.  HR slow  Pulmonary/Chest: Effort normal and breath sounds normal. No respiratory distress. She has no wheezes. She has no rales.  Abdominal: Soft. Bowel  sounds are normal. She exhibits no distension. There is no tenderness. There is no rebound.  Musculoskeletal: She exhibits no edema.  Neurological: She is alert and oriented to person, place, and time. Coordination normal.  Skin: Skin is warm and dry.  Psychiatric: She has a normal mood and affect.   Vitals:   07/01/18 1359  BP: (!) 184/70  Pulse: (!) 52  Temp: 98.4 F (36.9 C)  TempSrc: Oral  SpO2: 98%  Weight: 175 lb (79.4 kg)  Height: 5\' 4"  (1.626 m)   EKG: Rate 49, bradycardia, 2ns degree heart block 2:1, axis normal, intervals normal, no st or t wave changes, 2nd degree block is new from prior 2015 EKG    Assessment & Plan:

## 2018-07-01 NOTE — Assessment & Plan Note (Signed)
Urgent referral to cardiology along with new 2nd degree AV block. Checking labs including CBC, CMP, troponin, BNP, thyroid today. Adjust as needed. Advised to avoid any medications which do not belong to her. Not on any rate reducing meds. AV block not present on prior EKG 2015.

## 2018-07-01 NOTE — Assessment & Plan Note (Addendum)
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 Checking CBC, thyroid, troponin, BNP CMP today. Urgent referral to cardiology for new block as well as concurrent chest tightness and SOB.

## 2018-07-01 NOTE — Assessment & Plan Note (Signed)
Has long standing white coat and BP is elevated in the office but has daily home readings from last 2 weeks with average 140/70s. Given new 2nd degree mobitz 2 block will not add anything.

## 2018-07-02 ENCOUNTER — Encounter: Payer: Self-pay | Admitting: *Deleted

## 2018-07-02 ENCOUNTER — Encounter: Payer: Self-pay | Admitting: Cardiology

## 2018-07-02 ENCOUNTER — Ambulatory Visit (INDEPENDENT_AMBULATORY_CARE_PROVIDER_SITE_OTHER): Payer: Medicare Other | Admitting: Cardiology

## 2018-07-02 VITALS — BP 163/73 | HR 49 | Ht 64.0 in | Wt 173.8 lb

## 2018-07-02 DIAGNOSIS — R001 Bradycardia, unspecified: Secondary | ICD-10-CM | POA: Diagnosis not present

## 2018-07-02 DIAGNOSIS — I441 Atrioventricular block, second degree: Secondary | ICD-10-CM | POA: Diagnosis not present

## 2018-07-02 DIAGNOSIS — R0789 Other chest pain: Secondary | ICD-10-CM | POA: Diagnosis not present

## 2018-07-02 NOTE — Patient Instructions (Signed)
Medication Instructions:   NO CHANGE  Testing/Procedures:  Your physician has requested that you have en exercise stress myoview. For further information please visit [email or web address]. Please follow instruction sheet, as given.  Your physician has recommended that you wear a 48 HOUR holter monitor. Holter monitors are medical devices that record the heart's electrical activity. Doctors most often use these monitors to diagnose arrhythmias. Arrhythmias are problems with the speed or rhythm of the heartbeat. The monitor is a small, portable device. You can wear one while you do your normal daily activities. This is usually used to diagnose what is causing palpitations/syncope (passing out).   Follow-Up:  Your physician recommends that you schedule a follow-up appointment in: AFTER TESTING

## 2018-07-02 NOTE — H&P (View-Only) (Signed)
PCP: Plotnikov, Evie Lacks, MD  Clinic Note: Chief Complaint  Patient presents with  . Chest Pain  . Shortness of Breath  . Bradycardia     HPI: Anna Ellis is a 67 y.o. female who is being seen today for the evaluation of CHEST PAIN/DOE WITH "HEART BLOCK"  at the request of [name], *.  Anna Ellis was seen by Dr. Sharlet Salina on the 23rd with complaints of having an episode of chest tightness and dyspnea carrying her baby grandson up a flight of steps.  This is totally new thing for her.  She was noted to be bradycardic on exam & EKG   Recent Hospitalizations: n/a  Studies Personally Reviewed - (if available, images/films reviewed: From Epic Chart or Care Everywhere)  EKG from PCP 7/23: NSR with 2:1 AV Block - Ventricular rate of 49 bpm (cannot tell if Mobitz 1 or 2).  Interval History: Anna Ellis presents today noting that from October to June she did not notice any symptoms, but starting back in June she started feeling a little more fatigued when being active, but then on June 18th she had an episode where she really felt fatigued and heaviness in her chest with going up a flight of steps.  She has been noticing over the last week or so then she is been having fatigue and tightness and shortness of breath carrying bags of groceries.   One since that she described was carrying food and other packages for a picnic with her family.  This required walking distance up and down hills and she was not able to do the last year without having to stop to rest.  This has never been an issue with her before.   Actually, the last time she stopped because she felt tired and fatigued with some chest tightness associated with it.  She is noted that her heart rates been a little bit low when checking her blood pressure.  Usually is in the 60s or 70s and her weight is been in the 40s and as low as a 38 bpm.  She has been more than a little concerned about this.  She has not been on any new  medications.  She says that she can feel her heart beating hard and forceful, but has not really noted palpitations certainly nothing fast or irregular.  She has not had any syncope or near syncope, but has felt tired and fatigued.  No PND, orthopnea or edema. No Syncope/near syncope, orTIA/amaurosis fugax symptoms. No claudication.  She is a little bit concerned because she is hoping to to watch her grandson play [employer] national championship tournament next week.  Summary of  major episodes:  1. June 18: Profoundly short of breath with chest tightness and fatigue carrying her 20 pound grandchild up a flight of steps.  -->  Has noted similar symptoms with exertion since  2. June 28: At [employer] for baseball tournament.  Carrying equipment up a big hill -->"had to stop catch breath & noted chest tightness. 3.  PCP office - HR slow - AVB on EKG: 5 days since visit:  Blood pressure range 123/56 mmHg - 176/67 mmHg.   Heart rate ranges from 39 to 74 bpm.  ROS: A comprehensive was performed.  Pertinent symptoms noted above.  Otherwise essentially negative   Review of Systems  Constitutional: Positive for malaise/fatigue. Negative for weight loss.  HENT: Negative for congestion.   Respiratory: Negative for cough.  Gastrointestinal: Negative for abdominal pain and heartburn.  Genitourinary: Negative for dysuria.  Musculoskeletal: Negative for falls and joint pain.  Neurological: Positive for dizziness (She has had a little bit dizzy, but not overly symptomatical --  no real vertigo symptoms.). Negative for focal weakness and headaches.  Psychiatric/Behavioral: Negative.   All other systems reviewed and are negative.  I have reviewed and (if needed) personally updated the patient's problem list, medications, allergies, past medical and surgical history, social and family history.   Past Medical History:  Diagnosis Date  . ALLERGIC RHINITIS 09/19/2007  . Allergic urticaria  12/15/2008  . ANEMIA-IRON DEFICIENCY 09/19/2007  . ANXIETY 09/19/2007  . Arthritis   . Asthma with hay fever 06/15/2011  . Asthma, cough variant 12/15/2008  . Basal cell cancer   . CAROTID BRUIT 01/08/2011  . Hyperlipidemia   . OSTEOARTHRITIS 09/19/2007  . SHOULDER PAIN 01/06/2010    Past Surgical History:  Procedure Laterality Date  . ABDOMINAL HYSTERECTOMY  1990  . ANTERIOR CRUCIATE LIGAMENT REPAIR  2001   LEFT KNEE  . ANTERIOR CRUCIATE LIGAMENT REPAIR  2001  . BREAST LUMPECTOMY  2002  . CHOLECYSTECTOMY  1994  . HERNIA REPAIR  1970   inguinal- rt  . [place]   left  . TEARDUCT SURGERY  1955  . TONSILLECTOMY AND ADENOIDECTOMY  1968  . TUBAL LIGATION  1982  . WISDOM TOOTH EXTRACTION  1967    Current Meds  Medication Sig  . acyclovir (ZOVIRAX) 800 MG tablet Take 1 tablet (800 mg total) by mouth 5 (five) times daily. Reported on 03/21/2016  . ALPRAZolam (XANAX) 0.25 MG tablet Take 1 tablet (0.25 mg total) by mouth 2 (two) times daily as needed.  Marland Kitchen atorvastatin (LIPITOR) 20 MG tablet Take 1 tablet (20 mg total) by mouth daily.  . Cholecalciferol (VITAMIN D3) 2000 units TABS Take by mouth.  . etodolac (LODINE) 500 MG tablet TAKE 1 TABLET BY MOUTH TWICE DAILY AS NEEDED WITH FOOD  . metroNIDAZOLE (METROCREAM) 0.75 % cream Reported on 03/21/2016  . mometasone (NASONEX) 50 MCG/ACT nasal spray Place 2 sprays into the nose daily.  . montelukast (SINGULAIR) 10 MG tablet Take 1 tablet (10 mg total) by mouth daily.  Marland Kitchen nystatin (MYCOSTATIN) 100000 UNIT/ML suspension Take 5 mLs (500,000 Units total) by mouth 4 (four) times daily.  Marland Kitchen omeprazole (PRILOSEC) 20 MG capsule Take 1 capsule (20 mg total) by mouth 2 (two) times daily before a meal. White caps please    Allergies  Allergen Reactions  . Clarithromycin   . Food Color Red   . Sulfonamide Derivatives   . Cobalt Rash    Found on Allergy Test  . Parabens Rash    Social History   Tobacco Use  . Smoking status: Never Smoker    . Smokeless tobacco: Never Used  Substance Use Topics  . Alcohol use: No  . Drug use: No   Social History   Social History Narrative   She is a married (49 years).  They have 3 children and 5 grandchildren.  She is a retired Financial trader for [employer].   She regularly exercises walking and using stairs etc.    family history includes Allergies in her son; Asthma in her son; Breast cancer in her mother; Heart disease in her father; Pneumonia in her paternal grandmother; Throat cancer in her maternal grandfather; Tuberculosis in her maternal grandmother.  Wt Readings from Last 3 Encounters:  07/02/18 173 lb 12.8 oz (78.8  kg)  07/01/18 175 lb (79.4 kg)  05/01/18 173 lb (78.5 kg)    PHYSICAL EXAM BP (!) 163/73   Pulse (!) 49   Ht 5\' 4"  (1.626 m)   Wt 173 lb 12.8 oz (78.8 kg)   SpO2 99%   BMI 29.83 kg/m  Physical Exam  Constitutional: She is oriented to person, place, and time. She appears well-developed and well-nourished. No distress.  Well-groomed.  Healthy-appearing.  HENT:  Head: Normocephalic and atraumatic.  Eyes: Pupils are equal, round, and reactive to light. Conjunctivae and EOM are normal. No scleral icterus.  Neck: Normal range of motion. Neck supple. No hepatojugular reflux and no JVD present. Carotid bruit is not present. No thyromegaly present.  Cardiovascular: Intact distal pulses and normal pulses. A regularly irregular rhythm present. Bradycardia present. PMI is not displaced. Exam reveals no gallop and no friction rub.  No murmur heard. Pulmonary/Chest: Effort normal and breath sounds normal. No respiratory distress. She has no wheezes. She has no rales.  Abdominal: Soft. Bowel sounds are normal. She exhibits no distension. There is no tenderness. There is no rebound.  No HSM  Musculoskeletal: Normal range of motion. She exhibits no edema.  Lymphadenopathy:    She has no cervical adenopathy.  Neurological: She is alert and oriented to  person, place, and time. No cranial nerve deficit.  Skin: Skin is warm and dry. No erythema.  Psychiatric: She has a normal mood and affect. Her behavior is normal. Judgment and thought content normal.  Vitals reviewed.    Adult ECG Report Not checked  Other studies Reviewed: Additional studies/ records that were reviewed today include:  Recent Labs:   Lab Results  Component Value Date   CREATININE 0.88 07/01/2018   BUN 21 07/01/2018   NA 140 07/01/2018   K 3.7 07/01/2018   CL 106 07/01/2018   CO2 27 07/01/2018   Lab Results  Component Value Date   TSH 2.22 07/01/2018   Lab Results  Component Value Date   CHOL 167 05/01/2018   HDL 59.90 05/01/2018   LDLCALC 88 05/01/2018   TRIG 93.0 05/01/2018   CHOLHDL 3 05/01/2018   Lab Results  Component Value Date   WBC 7.8 07/01/2018   HGB 14.1 07/01/2018   HCT 41.3 07/01/2018   MCV 93.2 07/01/2018   PLT 374.0 07/01/2018      ASSESSMENT / PLAN: Problem List Items Addressed This Visit    Symptomatic bradycardia    She has bradycardia but is not on any medications that would affect the AV node.  This does not seem to be an isolated event.  She has had neurologic evaluations for the last 5 days all of them have some systolic hypertension but with heart rates in the 39 to 51 bpm range.  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  My concern is that this is Mobitz type II and it would potentially warrant pacemaker placement. Plan: 48-hour monitor ASAP. Exercise Stress Myoview -she had chest tightness and dyspnea which could very well been related to her abnormal right wrist, however need to exclude ischemia.  We will also evaluate her heart rate responsiveness to exercise.   --If indicated - will refer to Dr. Sallyanne Kuster (her husband's Cardiologist) to discuss PPM.      Relevant Orders   Holter monitor - 48 hour        I spent a total of 57minutes with the patient and chart review. >  50% of the time was spent in direct patient consultation.   Current medicines are reviewed at length with the patient today.  (+/- concerns) ? Is it safe to travel next week. The following changes have been made:  see below  Patient Instructions  Medication Instructions:   NO CHANGE  Testing/Procedures:  Your physician has requested that you have en exercise stress myoview. For further information please visit [email or web address]. Please follow instruction sheet, as given.  Your physician has recommended that you wear a 48 HOUR holter monitor. Holter monitors are medical devices that record the heart's electrical activity. Doctors most often use these monitors to diagnose arrhythmias. Arrhythmias are problems with the speed or rhythm of the heartbeat. The monitor is a small, portable device. You can wear one while you do your normal daily activities. This is usually used to diagnose what is causing palpitations/syncope (passing out).   Follow-Up:  Your physician recommends that you schedule a follow-up appointment in: AFTER TESTING      Studies Ordered:   Orders Placed This Encounter  Procedures  . Holter monitor - 48 hour  . MYOCARDIAL PERFUSION IMAGING     ADDENDUM 07/04/2017 -Myoview stress test  performed today.  Treadmill on not in protocol, unable to increase heart rate greater than 70 bpm.  Patient maintained 2:1 & 3:1 heart block.  Then during increased exercise began to have PVCs with increasing frequency.  There was a 4 beat run of PVCs.  The patient was relatively asymptomatic.  Images revealed an anterior defect that is read as a computer is being small, however on visualization is at least a moderate sized, moderate intensity perfusion defect is partial reversible in the basal to apical anterior wall concerning for possible infarct with ischemia versus simply ischemia.  Based on these findings and the patient's symptoms of exertional chest tightness and dyspnea, I am concerned that her symptoms could very well be angina and that her heart block concerns may be related to ischemic coronary disease.  I saw her following her stress test discussed the results with her.  My recommendation will be to proceed with cardiac catheterization next week to determine if there is ischemic CAD potentially causing her bradycardia.  We will also review the treadmill strips with electrophysiology to determine if this level of heart block is of concern enough to potentially consider need for pacemaker.  Performing MD:  Glenetta Hew, M.D., M.S.  Procedure: LEFT HEART CATHETERIZATION WITH CORONARY ANGIOGRAPHY AND POSSIBLE PERCUTANEOUS CORONARY INTERVENTION  The procedure with Risks/Benefits/Alternatives and Indications was reviewed with the patient and her husband.  All questions were answered.    Risks / Complications include,  but not limited to: Death, MI, CVA/TIA, VF/VT (with defibrillation), Bradycardia (need for temporary pacer placement), contrast induced nephropathy , bleeding / bruising / hematoma / pseudoaneurysm, vascular or coronary injury (with possible emergent CT or Vascular Surgery), adverse medication reactions, infection.  Additional risks involving the use of radiation with the possibility  of radiation burns and cancer were explained in detail.  The patient (and family) voice understanding and agree to proceed.      Glenetta Hew, M.D., M.S. Interventional Cardiologist   Pager # [phone] Phone # [phone] [address] [place], [place] [address]   Thank you for choosing [facility] at [facility]!!

## 2018-07-02 NOTE — Progress Notes (Addendum)
PCP: Anna Ellis, Anna Lacks, MD  Clinic Note: Chief Complaint  Patient presents with  . Chest Pain  . Shortness of Breath  . Bradycardia     HPI: Anna Ellis is a 67 y.o. female who is being seen today for the evaluation of CHEST PAIN/DOE WITH "HEART BLOCK"  at the request of Anna Ellis, *.  Anna Ellis was seen by Dr. Sharlet Ellis on the 23rd with complaints of having an episode of chest tightness and dyspnea carrying her baby grandson up a flight of steps.  This is totally new thing for her.  She was noted to be bradycardic on exam & EKG   Recent Hospitalizations: n/a  Studies Personally Reviewed - (if available, images/films reviewed: From Epic Chart or Care Everywhere)  EKG from PCP 7/23: NSR with 2:1 AV Block - Ventricular rate of 49 bpm (cannot tell if Mobitz 1 or 2).  Interval History: Anna Ellis presents today noting that from October to June she did not notice any symptoms, but starting back in June she started feeling a little more fatigued when being active, but then on June 18th she had an episode where she really felt fatigued and heaviness in her chest with going up a flight of steps.  She has been noticing over the last week or so then she is been having fatigue and tightness and shortness of breath carrying bags of groceries.   One since that she described was carrying food and other packages for a picnic with her family.  This required walking distance up and down hills and she was not able to do the last year without having to stop to rest.  This has never been an issue with her before.   Actually, the last time she stopped because she felt tired and fatigued with some chest tightness associated with it.  She is noted that her heart rates been a little bit low when checking her blood pressure.  Usually is in the 60s or 70s and her weight is been in the 40s and as low as a 38 bpm.  She has been more than a little concerned about this.  She has not been on any new  medications.  She says that she can feel her heart beating hard and forceful, but has not really noted palpitations certainly nothing fast or irregular.  She has not had any syncope or near syncope, but has felt tired and fatigued.  No PND, orthopnea or edema. No Syncope/near syncope, orTIA/amaurosis fugax symptoms. No claudication.  She is a little bit concerned because she is hoping to to watch her grandson play [employer] national championship tournament next week.  Summary of  major episodes:  1. June 18: Profoundly short of breath with chest tightness and fatigue carrying her 20 pound grandchild up a flight of steps.  -->  Has noted similar symptoms with exertion since  2. June 28: At [employer] for baseball tournament.  Carrying equipment up a big hill -->"had to stop catch breath & noted chest tightness. 3.  PCP office - HR slow - AVB on EKG: 5 days since visit:  Blood pressure range 123/56 mmHg - 176/67 mmHg.   Heart rate ranges from 39 to 74 bpm.  ROS: A comprehensive was performed.  Pertinent symptoms noted above.  Otherwise essentially negative   Review of Systems  Constitutional: Positive for malaise/fatigue. Negative for weight loss.  HENT: Negative for congestion.   Respiratory: Negative for cough.  Gastrointestinal: Negative for abdominal pain and heartburn.  Genitourinary: Negative for dysuria.  Musculoskeletal: Negative for falls and joint pain.  Neurological: Positive for dizziness (She has had a little bit dizzy, but not overly symptomatical --  no real vertigo symptoms.). Negative for focal weakness and headaches.  Psychiatric/Behavioral: Negative.   All other systems reviewed and are negative.  I have reviewed and (if needed) personally updated the patient's problem list, medications, allergies, past medical and surgical history, social and family history.   Past Medical History:  Diagnosis Date  . ALLERGIC RHINITIS 09/19/2007  . Allergic urticaria  12/15/2008  . ANEMIA-IRON DEFICIENCY 09/19/2007  . ANXIETY 09/19/2007  . Arthritis   . Asthma with hay fever 06/15/2011  . Asthma, cough variant 12/15/2008  . Basal cell cancer   . CAROTID BRUIT 01/08/2011  . Hyperlipidemia   . OSTEOARTHRITIS 09/19/2007  . SHOULDER PAIN 01/06/2010    Past Surgical History:  Procedure Laterality Date  . ABDOMINAL HYSTERECTOMY  1990  . ANTERIOR CRUCIATE LIGAMENT REPAIR  2001   LEFT KNEE  . ANTERIOR CRUCIATE LIGAMENT REPAIR  2001  . BREAST LUMPECTOMY  2002  . CHOLECYSTECTOMY  1994  . HERNIA REPAIR  1970   inguinal- rt  . [place]   left  . TEARDUCT SURGERY  1955  . TONSILLECTOMY AND ADENOIDECTOMY  1968  . TUBAL LIGATION  1982  . WISDOM TOOTH EXTRACTION  1967    Current Meds  Medication Sig  . acyclovir (ZOVIRAX) 800 MG tablet Take 1 tablet (800 mg total) by mouth 5 (five) times daily. Reported on 03/21/2016  . ALPRAZolam (XANAX) 0.25 MG tablet Take 1 tablet (0.25 mg total) by mouth 2 (two) times daily as needed.  Marland Kitchen atorvastatin (LIPITOR) 20 MG tablet Take 1 tablet (20 mg total) by mouth daily.  . Cholecalciferol (VITAMIN D3) 2000 units TABS Take by mouth.  . etodolac (LODINE) 500 MG tablet TAKE 1 TABLET BY MOUTH TWICE DAILY AS NEEDED WITH FOOD  . metroNIDAZOLE (METROCREAM) 0.75 % cream Reported on 03/21/2016  . mometasone (NASONEX) 50 MCG/ACT nasal spray Place 2 sprays into the nose daily.  . montelukast (SINGULAIR) 10 MG tablet Take 1 tablet (10 mg total) by mouth daily.  Marland Kitchen nystatin (MYCOSTATIN) 100000 UNIT/ML suspension Take 5 mLs (500,000 Units total) by mouth 4 (four) times daily.  Marland Kitchen omeprazole (PRILOSEC) 20 MG capsule Take 1 capsule (20 mg total) by mouth 2 (two) times daily before a meal. White caps please    Allergies  Allergen Reactions  . Clarithromycin   . Food Color Red   . Sulfonamide Derivatives   . Cobalt Rash    Found on Allergy Test  . Parabens Rash    Social History   Tobacco Use  . Smoking status: Never Smoker    . Smokeless tobacco: Never Used  Substance Use Topics  . Alcohol use: No  . Drug use: No   Social History   Social History Narrative   She is a married (49 years).  They have 3 children and 5 grandchildren.  She is a retired Financial trader for [employer].   She regularly exercises walking and using stairs etc.    family history includes Allergies in her son; Asthma in her son; Breast cancer in her mother; Heart disease in her father; Pneumonia in her paternal grandmother; Throat cancer in her maternal grandfather; Tuberculosis in her maternal grandmother.  Wt Readings from Last 3 Encounters:  07/02/18 173 lb 12.8 oz (78.8  kg)  07/01/18 175 lb (79.4 kg)  05/01/18 173 lb (78.5 kg)    PHYSICAL EXAM BP (!) 163/73   Pulse (!) 49   Ht 5\' 4"  (1.626 m)   Wt 173 lb 12.8 oz (78.8 kg)   SpO2 99%   BMI 29.83 kg/m  Physical Exam  Constitutional: She is oriented to person, place, and time. She appears well-developed and well-nourished. No distress.  Well-groomed.  Healthy-appearing.  HENT:  Head: Normocephalic and atraumatic.  Eyes: Pupils are equal, round, and reactive to light. Conjunctivae and EOM are normal. No scleral icterus.  Neck: Normal range of motion. Neck supple. No hepatojugular reflux and no JVD present. Carotid bruit is not present. No thyromegaly present.  Cardiovascular: Intact distal pulses and normal pulses. A regularly irregular rhythm present. Bradycardia present. PMI is not displaced. Exam reveals no gallop and no friction rub.  No murmur heard. Pulmonary/Chest: Effort normal and breath sounds normal. No respiratory distress. She has no wheezes. She has no rales.  Abdominal: Soft. Bowel sounds are normal. She exhibits no distension. There is no tenderness. There is no rebound.  No HSM  Musculoskeletal: Normal range of motion. She exhibits no edema.  Lymphadenopathy:    She has no cervical adenopathy.  Neurological: She is alert and oriented to  person, place, and time. No cranial nerve deficit.  Skin: Skin is warm and dry. No erythema.  Psychiatric: She has a normal mood and affect. Her behavior is normal. Judgment and thought content normal.  Vitals reviewed.    Adult ECG Report Not checked  Other studies Reviewed: Additional studies/ records that were reviewed today include:  Recent Labs:   Lab Results  Component Value Date   CREATININE 0.88 07/01/2018   BUN 21 07/01/2018   NA 140 07/01/2018   K 3.7 07/01/2018   CL 106 07/01/2018   CO2 27 07/01/2018   Lab Results  Component Value Date   TSH 2.22 07/01/2018   Lab Results  Component Value Date   CHOL 167 05/01/2018   HDL 59.90 05/01/2018   LDLCALC 88 05/01/2018   TRIG 93.0 05/01/2018   CHOLHDL 3 05/01/2018   Lab Results  Component Value Date   WBC 7.8 07/01/2018   HGB 14.1 07/01/2018   HCT 41.3 07/01/2018   MCV 93.2 07/01/2018   PLT 374.0 07/01/2018      ASSESSMENT / PLAN: Problem List Items Addressed This Visit    Symptomatic bradycardia    She has bradycardia but is not on any medications that would affect the AV node.  This does not seem to be an isolated event.  She has had neurologic evaluations for the last 5 days all of them have some systolic hypertension but with heart rates in the 39 to 51 bpm range.  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  My concern is that this is Mobitz type II and it would potentially warrant pacemaker placement. Plan: 48-hour monitor ASAP. Exercise Stress Myoview -she had chest tightness and dyspnea which could very well been related to her abnormal right wrist, however need to exclude ischemia.  We will also evaluate her heart rate responsiveness to exercise.   --If indicated - will refer to Dr. Sallyanne Kuster (her husband's Cardiologist) to discuss PPM.      Relevant Orders   Holter monitor - 48 hour        I spent a total of 58minutes with the patient and chart review. >  50% of the time was spent in direct patient consultation.   Current medicines are reviewed at length with the patient today.  (+/- concerns) ? Is it safe to travel next week. The following changes have been made:  see below  Patient Instructions  Medication Instructions:   NO CHANGE  Testing/Procedures:  Your physician has requested that you have en exercise stress myoview. For further information please visit [email or web address]. Please follow instruction sheet, as given.  Your physician has recommended that you wear a 48 HOUR holter monitor. Holter monitors are medical devices that record the heart's electrical activity. Doctors most often use these monitors to diagnose arrhythmias. Arrhythmias are problems with the speed or rhythm of the heartbeat. The monitor is a small, portable device. You can wear one while you do your normal daily activities. This is usually used to diagnose what is causing palpitations/syncope (passing out).   Follow-Up:  Your physician recommends that you schedule a follow-up appointment in: AFTER TESTING      Studies Ordered:   Orders Placed This Encounter  Procedures  . Holter monitor - 48 hour  . MYOCARDIAL PERFUSION IMAGING     ADDENDUM 07/04/2017 -Myoview stress test  performed today.  Treadmill on not in protocol, unable to increase heart rate greater than 70 bpm.  Patient maintained 2:1 & 3:1 heart block.  Then during increased exercise began to have PVCs with increasing frequency.  There was a 4 beat run of PVCs.  The patient was relatively asymptomatic.  Images revealed an anterior defect that is read as a computer is being small, however on visualization is at least a moderate sized, moderate intensity perfusion defect is partial reversible in the basal to apical anterior wall concerning for possible infarct with ischemia versus simply ischemia.  Based on these findings and the patient's symptoms of exertional chest tightness and dyspnea, I am concerned that her symptoms could very well be angina and that her heart block concerns may be related to ischemic coronary disease.  I saw her following her stress test discussed the results with her.  My recommendation will be to proceed with cardiac catheterization next week to determine if there is ischemic CAD potentially causing her bradycardia.  We will also review the treadmill strips with electrophysiology to determine if this level of heart block is of concern enough to potentially consider need for pacemaker.  Performing MD:  Glenetta Hew, M.D., M.S.  Procedure: LEFT HEART CATHETERIZATION WITH CORONARY ANGIOGRAPHY AND POSSIBLE PERCUTANEOUS CORONARY INTERVENTION  The procedure with Risks/Benefits/Alternatives and Indications was reviewed with the patient and her husband.  All questions were answered.    Risks / Complications include,  but not limited to: Death, MI, CVA/TIA, VF/VT (with defibrillation), Bradycardia (need for temporary pacer placement), contrast induced nephropathy , bleeding / bruising / hematoma / pseudoaneurysm, vascular or coronary injury (with possible emergent CT or Vascular Surgery), adverse medication reactions, infection.  Additional risks involving the use of radiation with the possibility  of radiation burns and cancer were explained in detail.  The patient (and family) voice understanding and agree to proceed.      Glenetta Hew, M.D., M.S. Interventional Cardiologist   Pager # [phone] Phone # [phone] [address] [place], [place] [address]   Thank you for choosing [facility] at [facility]!!

## 2018-07-03 ENCOUNTER — Telehealth (HOSPITAL_COMMUNITY): Payer: Self-pay

## 2018-07-03 NOTE — Telephone Encounter (Signed)
Encounter complete. 

## 2018-07-04 ENCOUNTER — Other Ambulatory Visit: Payer: Self-pay | Admitting: *Deleted

## 2018-07-04 ENCOUNTER — Telehealth: Payer: Self-pay | Admitting: Internal Medicine

## 2018-07-04 ENCOUNTER — Encounter (HOSPITAL_COMMUNITY): Payer: Self-pay | Admitting: *Deleted

## 2018-07-04 ENCOUNTER — Ambulatory Visit (HOSPITAL_COMMUNITY)
Admission: RE | Admit: 2018-07-04 | Discharge: 2018-07-04 | Disposition: A | Discharge status: Home / Self Care | Payer: Medicare Other | Source: Ambulatory Visit | Attending: Cardiology | Admitting: Cardiology

## 2018-07-04 ENCOUNTER — Encounter: Payer: Self-pay | Admitting: Cardiology

## 2018-07-04 DIAGNOSIS — I493 Ventricular premature depolarization: Secondary | ICD-10-CM | POA: Insufficient documentation

## 2018-07-04 DIAGNOSIS — R0789 Other chest pain: Secondary | ICD-10-CM

## 2018-07-04 DIAGNOSIS — R9439 Abnormal result of other cardiovascular function study: Secondary | ICD-10-CM

## 2018-07-04 LAB — MYOCARDIAL PERFUSION IMAGING
LV dias vol: 118 mL (ref 46–106)
LV sys vol: 49 mL
Peak HR: 72 {beats}/min
Rest HR: 62 {beats}/min
SDS: 1
SRS: 1
SSS: 2
TID: 1.35

## 2018-07-04 MED ORDER — TECHNETIUM TC 99M TETROFOSMIN IV KIT
10.4000 | PACK | Freq: Once | INTRAVENOUS | Status: AC | PRN
  Administered 2018-07-04: 10.4 via INTRAVENOUS
  Filled 2018-07-04: qty 11

## 2018-07-04 MED ORDER — TECHNETIUM TC 99M TETROFOSMIN IV KIT
30.9000 | PACK | Freq: Once | INTRAVENOUS | Status: AC | PRN
  Administered 2018-07-04: 30.9 via INTRAVENOUS
  Filled 2018-07-04: qty 31

## 2018-07-04 MED ORDER — REGADENOSON 0.4 MG/5ML IV SOLN
0.4000 mg | Freq: Once | INTRAVENOUS | Status: AC
Start: 2018-07-04 — End: 2018-07-04
  Administered 2018-07-04: 0.4 mg via INTRAVENOUS

## 2018-07-04 NOTE — Telephone Encounter (Signed)
Copied from [place] [phone]. Topic: Quick Communication - Lab Results >> Jul 02, 2018  9:05 AM Blondell Reveal, CMA wrote: Called patient to inform them of 07/01/2018 lab results. When patient returns call, triage nurse may disclose results. >> Jul 04, 2018  2:04 PM Selinda Flavin B, NT wrote: Patient calling and states that someone is calling her about lab results. States that she has already looked at them on MyChart. Does not have any questions regarding those results.

## 2018-07-04 NOTE — Telephone Encounter (Signed)
noted 

## 2018-07-04 NOTE — Assessment & Plan Note (Signed)
She has bradycardia but is not on any medications that would affect the AV node.  This does not seem to be an isolated event.  She has had neurologic evaluations for the last 5 days all of them have some systolic hypertension but with heart rates in the 39 to 51 bpm range.  48 hr monitor TM Myoview -- to evaluate HR responsiveness to exercise

## 2018-07-04 NOTE — Assessment & Plan Note (Addendum)
I do agree that she has 2: 1 AV block (cannot determine if this is type I or type II Mobitz - with 2:1 block) -would need to see her heart rate with ventilatory monitoring and evaluate her heart rate response to exercise to determine if this is Mobitz type I or Mobitz type II.  My concern is that this is Mobitz type II and it would potentially warrant pacemaker placement. Plan: 48-hour monitor ASAP. Exercise Stress Myoview -she had chest tightness and dyspnea which could very well been related to her abnormal right wrist, however need to exclude ischemia.  We will also evaluate her heart rate responsiveness to exercise.   --If indicated - will refer to Dr. Sallyanne Kuster (her husband's Cardiologist) to discuss PPM.

## 2018-07-04 NOTE — Progress Notes (Signed)
Pt was evaluated by Dr Ellyn Hack after Myoview study. Pt has a cath scheduled for July 08, 2018.

## 2018-07-04 NOTE — Assessment & Plan Note (Signed)
Certainly need to make sure that her symptom is nonischemic, however it could also very well be related to her bradycardia 2:1 AVB -->   Plan: TM Stress Myoview (if unable to achieve target HR - convert to [place] (but need to closely monitor HR)

## 2018-07-07 ENCOUNTER — Ambulatory Visit (INDEPENDENT_AMBULATORY_CARE_PROVIDER_SITE_OTHER): Payer: Medicare Other

## 2018-07-07 ENCOUNTER — Telehealth: Payer: Self-pay | Admitting: *Deleted

## 2018-07-07 DIAGNOSIS — R001 Bradycardia, unspecified: Secondary | ICD-10-CM

## 2018-07-07 DIAGNOSIS — I441 Atrioventricular block, second degree: Secondary | ICD-10-CM

## 2018-07-07 NOTE — Telephone Encounter (Signed)
Pt contacted pre-catheterization scheduled at [facility] for: Tuesday July 08, 2018 10:30 AM Verified arrival time and place: [place] Entrance A at: 8 AM  No solid food after midnight prior to cath, clear liquids until 5 AM day of procedure. Verified allergies in Epic Verified no diabetes medications.  AM meds can be  taken pre-cath with sip of water including: ASA 81 mg  Confirmed patient has responsible person to drive home post procedure and for 24 hours after you arrive home: yes

## 2018-07-08 ENCOUNTER — Encounter (HOSPITAL_COMMUNITY)
Admission: RE | Disposition: A | Discharge status: Home / Self Care | Payer: Self-pay | Source: Ambulatory Visit | Attending: Cardiology

## 2018-07-08 ENCOUNTER — Ambulatory Visit (HOSPITAL_COMMUNITY)
Admission: RE | Admit: 2018-07-08 | Discharge: 2018-07-08 | Disposition: A | Discharge status: Home / Self Care | Payer: Medicare Other | Source: Ambulatory Visit | Attending: Cardiology | Admitting: Cardiology

## 2018-07-08 DIAGNOSIS — Z882 Allergy status to sulfonamides status: Secondary | ICD-10-CM | POA: Insufficient documentation

## 2018-07-08 DIAGNOSIS — M1990 Unspecified osteoarthritis, unspecified site: Secondary | ICD-10-CM | POA: Insufficient documentation

## 2018-07-08 DIAGNOSIS — R0789 Other chest pain: Secondary | ICD-10-CM | POA: Diagnosis present

## 2018-07-08 DIAGNOSIS — R9439 Abnormal result of other cardiovascular function study: Secondary | ICD-10-CM | POA: Diagnosis present

## 2018-07-08 DIAGNOSIS — E785 Hyperlipidemia, unspecified: Secondary | ICD-10-CM | POA: Diagnosis not present

## 2018-07-08 DIAGNOSIS — I2510 Atherosclerotic heart disease of native coronary artery without angina pectoris: Secondary | ICD-10-CM | POA: Insufficient documentation

## 2018-07-08 DIAGNOSIS — I441 Atrioventricular block, second degree: Secondary | ICD-10-CM | POA: Diagnosis not present

## 2018-07-08 DIAGNOSIS — F419 Anxiety disorder, unspecified: Secondary | ICD-10-CM | POA: Insufficient documentation

## 2018-07-08 DIAGNOSIS — J45909 Unspecified asthma, uncomplicated: Secondary | ICD-10-CM | POA: Insufficient documentation

## 2018-07-08 DIAGNOSIS — Z8249 Family history of ischemic heart disease and other diseases of the circulatory system: Secondary | ICD-10-CM | POA: Insufficient documentation

## 2018-07-08 HISTORY — PX: LEFT HEART CATH AND CORONARY ANGIOGRAPHY: CATH118249

## 2018-07-08 SURGERY — LEFT HEART CATH AND CORONARY ANGIOGRAPHY
Anesthesia: LOCAL

## 2018-07-08 MED ORDER — HEPARIN SODIUM (PORCINE) 1000 UNIT/ML IJ SOLN
INTRAMUSCULAR | Status: DC | PRN
  Administered 2018-07-08: 4000 [IU] via INTRAVENOUS

## 2018-07-08 MED ORDER — SODIUM CHLORIDE 0.9 % IV SOLN: 250.0000 mL | INTRAVENOUS | Status: DC | PRN

## 2018-07-08 MED ORDER — SODIUM CHLORIDE 0.9 % WEIGHT BASED INFUSION
3.0000 mL/kg/h | INTRAVENOUS | Status: AC
  Administered 2018-07-08: 3 mL/kg/h via INTRAVENOUS

## 2018-07-08 MED ORDER — SODIUM CHLORIDE 0.9% FLUSH: 3.0000 mL | INTRAVENOUS | Status: DC | PRN

## 2018-07-08 MED ORDER — HEPARIN (PORCINE) IN NACL 1000-0.9 UT/500ML-% IV SOLN
INTRAVENOUS | Status: AC
  Filled 2018-07-08: qty 1000

## 2018-07-08 MED ORDER — ACETAMINOPHEN 325 MG PO TABS: 650.0000 mg | ORAL_TABLET | ORAL | Status: DC | PRN

## 2018-07-08 MED ORDER — HEPARIN SODIUM (PORCINE) 1000 UNIT/ML IJ SOLN
INTRAMUSCULAR | Status: AC
  Filled 2018-07-08: qty 1

## 2018-07-08 MED ORDER — SODIUM CHLORIDE 0.9% FLUSH: 3.0000 mL | Freq: Two times a day (BID) | INTRAVENOUS | Status: DC

## 2018-07-08 MED ORDER — VERAPAMIL HCL 2.5 MG/ML IV SOLN
INTRAVENOUS | Status: AC
  Filled 2018-07-08: qty 2

## 2018-07-08 MED ORDER — NITROGLYCERIN 1 MG/10 ML FOR IR/CATH LAB
INTRA_ARTERIAL | Status: DC | PRN
  Administered 2018-07-08: 200 ug via INTRACORONARY

## 2018-07-08 MED ORDER — FENTANYL CITRATE (PF) 100 MCG/2ML IJ SOLN
INTRAMUSCULAR | Status: DC | PRN
  Administered 2018-07-08: 25 ug via INTRAVENOUS

## 2018-07-08 MED ORDER — IOHEXOL 350 MG/ML SOLN
INTRAVENOUS | Status: DC | PRN
  Administered 2018-07-08: 70 mL via INTRA_ARTERIAL

## 2018-07-08 MED ORDER — HEPARIN (PORCINE) IN NACL 2-0.9 UNITS/ML
INTRAMUSCULAR | Status: DC | PRN
  Administered 2018-07-08: 10 mL via INTRA_ARTERIAL

## 2018-07-08 MED ORDER — MIDAZOLAM HCL 2 MG/2ML IJ SOLN
INTRAMUSCULAR | Status: AC
  Filled 2018-07-08: qty 2

## 2018-07-08 MED ORDER — LIDOCAINE HCL (PF) 1 % IJ SOLN
INTRAMUSCULAR | Status: AC
  Filled 2018-07-08: qty 30

## 2018-07-08 MED ORDER — ONDANSETRON HCL 4 MG/2ML IJ SOLN: 4.0000 mg | Freq: Four times a day (QID) | INTRAMUSCULAR | Status: DC | PRN

## 2018-07-08 MED ORDER — ASPIRIN 81 MG PO CHEW: 81.0000 mg | CHEWABLE_TABLET | ORAL | Status: DC

## 2018-07-08 MED ORDER — LIDOCAINE HCL (PF) 1 % IJ SOLN
INTRAMUSCULAR | Status: DC | PRN
  Administered 2018-07-08: 2 mL

## 2018-07-08 MED ORDER — HEPARIN (PORCINE) IN NACL 1000-0.9 UT/500ML-% IV SOLN
INTRAVENOUS | Status: DC | PRN
  Administered 2018-07-08 (×2): 500 mL

## 2018-07-08 MED ORDER — MIDAZOLAM HCL 2 MG/2ML IJ SOLN
INTRAMUSCULAR | Status: DC | PRN
  Administered 2018-07-08: 2 mg via INTRAVENOUS

## 2018-07-08 MED ORDER — FENTANYL CITRATE (PF) 100 MCG/2ML IJ SOLN
INTRAMUSCULAR | Status: AC
  Filled 2018-07-08: qty 2

## 2018-07-08 MED ORDER — SODIUM CHLORIDE 0.9 % WEIGHT BASED INFUSION: 1.0000 mL/kg/h | INTRAVENOUS | Status: DC

## 2018-07-08 MED ORDER — SODIUM CHLORIDE 0.9 % IV SOLN
INTRAVENOUS | Status: AC
  Administered 2018-07-08: 14:00:00 via INTRAVENOUS

## 2018-07-08 SURGICAL SUPPLY — 9 items
CATH OPTITORQUE TIG 4.0 5F (CATHETERS) ×1 IMPLANT
DEVICE RAD COMP TR BAND LRG (VASCULAR PRODUCTS) ×1 IMPLANT
GLIDESHEATH SLEND SS 6F .021 (SHEATH) ×1 IMPLANT
GUIDEWIRE INQWIRE 1.5J.035X260 (WIRE) IMPLANT
INQWIRE 1.5J .035X260CM (WIRE) ×2
KIT HEART LEFT (KITS) ×2 IMPLANT
PACK CARDIAC CATHETERIZATION (CUSTOM PROCEDURE TRAY) ×2 IMPLANT
TRANSDUCER W/STOPCOCK (MISCELLANEOUS) ×2 IMPLANT
TUBING CIL FLEX 10 FLL-RA (TUBING) ×2 IMPLANT

## 2018-07-08 NOTE — Progress Notes (Signed)
Echo is unable to do pt's echo today.  They will schedule her pre procedure on Thursday.  Echo to call pt.  Pt and family verbalize understanding.

## 2018-07-08 NOTE — H&P (View-Only) (Signed)
Cardiology Consultation:   Patient ID: Anna Ellis; [id number]; 1951/09/22   Admit date: 07/08/2018 Date of Consult: 07/08/2018  Primary Care Provider: Cassandria Anger, MD Primary Cardiologist: Dr. Ellyn Hack Primary Electrophysiologist:  [place], Dr. Rayann Heman   Patient Profile:   Anna Ellis is a 67 y.o. female with a hx of anxiety, asthma, HLD who is being seen today for the evaluation of heart block and PPM at the request of Dr. Ellyn Hack.  History of Present Illness:   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   She observed slower then usual HR when check in on her BP, saw her PMD who noted her to be in 2:1 AVBlock and referred her to cardiology.  She underwent stress testing that was abnormal, wore a holter monitor (returned yesterday, result/tracings unavailable) LHC today noting no significant CAD.  I do not find an echo.  Home meds reviewed, no potential nodal blocking/rate limiting agents. Labs 7/23, unremarkable with TSH wnl as well.   She is s/p cath, feels well.  She tells me she has noticed on occasion in the last few weeks a little dizziness that is not particularly new, historically attributed to known ear/allergy that clears up with OT decongestant type medicines.  She has never fainted, no near syncope.   Past Medical History:  Diagnosis Date  . ALLERGIC RHINITIS 09/19/2007  . Allergic urticaria 12/15/2008  . ANEMIA-IRON DEFICIENCY 09/19/2007  . ANXIETY 09/19/2007  . Arthritis   . Asthma with hay fever 06/15/2011  . Asthma, cough variant 12/15/2008  . Basal cell cancer   . CAROTID BRUIT 01/08/2011  . Hyperlipidemia   . OSTEOARTHRITIS 09/19/2007  . SHOULDER PAIN 01/06/2010    Past Surgical History:  Procedure Laterality Date  . ABDOMINAL HYSTERECTOMY  1990  . ANTERIOR CRUCIATE LIGAMENT REPAIR  2001   LEFT KNEE  . ANTERIOR CRUCIATE LIGAMENT REPAIR  2001  .  BREAST LUMPECTOMY  2002  . CHOLECYSTECTOMY  1994  . HERNIA REPAIR  1970   inguinal- rt  . [place]   left  . TEARDUCT SURGERY  1955  . TONSILLECTOMY AND ADENOIDECTOMY  1968  . TUBAL LIGATION  1982  . WISDOM TOOTH EXTRACTION  1967     Home Medications:  Prior to Admission medications   Medication Sig Start Date End Date Taking? Authorizing Provider  acetaminophen (TYLENOL) 500 MG tablet Take 1,000 mg by mouth every 6 (six) hours as needed (for pain.).   Yes [provider]  acyclovir (ZOVIRAX) 800 MG tablet Take 1 tablet (800 mg total) by mouth 5 (five) times daily. Reported on 03/21/2016 08/05/17  Yes Plotnikov, Evie Lacks, MD  ALPRAZolam Duanne Moron) 0.25 MG tablet Take 1 tablet (0.25 mg total) by mouth 2 (two) times daily as needed. Patient taking differently: Take 0.25 mg by mouth 2 (two) times daily as needed (for anxiety.).  05/01/18  Yes Plotnikov, Evie Lacks, MD  aspirin EC 81 MG tablet Take 81 mg by mouth daily.   Yes [provider]  aspirin-acetaminophen-caffeine (EXCEDRIN MIGRAINE) [phone] MG tablet Take 2 tablets by mouth every 6 (six) hours as needed for headache.   Yes [provider]  atorvastatin (LIPITOR) 20 MG tablet Take 1 tablet (20 mg total) by mouth daily. Patient taking differently: Take 10 mg by mouth every evening.  05/01/18  Yes Plotnikov, Evie Lacks, MD  Cholecalciferol (VITAMIN  D3) 2000 units TABS Take 2,000 Units by mouth daily.    Yes [provider]  etodolac (LODINE) 500 MG tablet TAKE 1 TABLET BY MOUTH TWICE DAILY AS NEEDED WITH FOOD Patient taking differently: Take 500 mg by mouth daily.  05/01/18  Yes Plotnikov, Evie Lacks, MD  mometasone (NASONEX) 50 MCG/ACT nasal spray Place 2 sprays into the nose daily. Patient taking differently: Place 2 sprays into the nose daily as needed (for allergies.).  08/05/17  Yes Plotnikov, Evie Lacks, MD  montelukast (SINGULAIR) 10 MG tablet Take 1 tablet (10 mg total) by mouth daily.  05/01/18 05/01/19 Yes Plotnikov, Evie Lacks, MD  omeprazole (PRILOSEC) 20 MG capsule Take 1 capsule (20 mg total) by mouth 2 (two) times daily before a meal. White caps please Patient taking differently: Take 20 mg by mouth daily before breakfast. White caps please 05/01/18  Yes Plotnikov, Evie Lacks, MD  Polyethyl Glycol-Propyl Glycol (LUBRICANT EYE DROPS) 0.4-0.3 % SOLN Place 1 drop into both eyes 3 (three) times daily as needed (for dry /irritated eyes.).   Yes [provider]  metroNIDAZOLE (METROCREAM) 0.75 % cream Apply 1 application topically daily as needed (for rosacea).    [provider]    Inpatient Medications: Scheduled Meds: . [START ON 07/09/2018] aspirin  81 mg Oral Pre-Cath  . sodium chloride flush  3 mL Intravenous Q12H  . sodium chloride flush  3 mL Intravenous Q12H   Continuous Infusions: . sodium chloride    . sodium chloride 75 mL/hr at 07/08/18 1332  . sodium chloride    . sodium chloride 1 mL/kg/hr (07/08/18 1020)   PRN Meds: sodium chloride, sodium chloride, acetaminophen, ondansetron (ZOFRAN) IV, sodium chloride flush, sodium chloride flush  Allergies:    Allergies  Allergen Reactions  . Clarithromycin Hives  . Food Color Red Hives  . Sulfonamide Derivatives Hives  . Cobalt Rash    Found on Allergy Test  . Parabens Rash    Social History:   Social History   Socioeconomic History  . Marital status: Married    Spouse name: Not on file  . Number of children: 3  . Years of education: Not on file  . Highest education level: High school graduate  Occupational History  . 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 Financial resource strain: Not on file  . Food insecurity:    Worry: Not on file    Inability: Not on file  . Transportation needs:    Medical: Not on file    Non-medical: Not on file  Tobacco Use  . Smoking status: Never Smoker  . Smokeless tobacco: Never Used    Substance and Sexual Activity  . Alcohol use: No  . Drug use: No  . Sexual activity: Yes  Lifestyle  . Physical activity:    Days per week: Not on file    Minutes per session: Not on file  . Stress: Not on file  Relationships  . Social connections:    Talks on phone: Not on file    Gets together: Not on file    Attends religious service: Not on file    Active member of club or organization: Not on file    Attends meetings of clubs or organizations: Not on file    Relationship status: Not on file  . Intimate partner violence:    Fear of current or ex partner: Not on file    Emotionally abused:  Not on file    Physically abused: Not on file    Forced sexual activity: Not on file  Other Topics Concern  . Not on file  Social History Narrative   She is a married (49 years).  They have 3 children and 5 grandchildren.  She is a retired Financial trader for [employer].   She regularly exercises walking and using stairs etc.    Family History:   Family History  Problem Relation Age of Onset  . Breast cancer Mother        breast  . Heart disease Father        h/o Rheumatic Fever - Rheumatic MV Dz.  . Allergies Son   . Asthma Son   . Tuberculosis Maternal Grandmother        died in her 61s  . Throat cancer Maternal Grandfather   . Pneumonia Paternal Grandmother      ROS:  Please see the history of present illness.  All other ROS reviewed and negative.     Physical Exam/Data:   Vitals:   07/08/18 1326 07/08/18 1327 07/08/18 1340 07/08/18 1355  BP: (!) 156/50  (!) 156/55 (!) 152/61  Pulse: (!) 46  (!) 42 (!) 45  Resp: 14     Temp:  97.6 F (36.4 C)    TempSrc:  Oral    SpO2: 100%  99% 99%  Weight:      Height:       No intake or output data in the 24 hours ending 07/08/18 1420 Filed Weights   07/08/18 0814  Weight: 169 lb (76.7 kg)   Body mass index is 29.01 kg/m.  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 The left ventricular ejection fraction is 55-65% by visual estimate.  LV end diastolic pressure is normal.  Prox LAD lesion is 20% stenosed. Mid LAD lesion is 25% stenosed.   Angiographically minimal CAD in the left dominant system. Well preserved LVEF with normal EDP. Persistent 2: 1 AVB with heart rate in the 50s.\  07/04/18: Lexiscan stress test  The left ventricular ejection fraction is normal (55-65%).  Nuclear stress EF: 58%.  No T wave inversion was noted during stress.  There was no ST segment deviation noted during stress.  Defect 1: There is a medium defect of moderate severity.  Findings consistent with ischemia.  This is an intermediate risk study. Medium size, moderate intensity reversible anterior perfusion defect suggestive of ischemia (visually appears worse than SDS score) in the LAD territory. LVEF 58% with normal wall motion. This is a intermediate risk study.     Laboratory Data:  Chemistry Recent Labs  Lab 07/01/18 1501  NA 140  K 3.7  CL 106  CO2 27  GLUCOSE 105*  BUN 21  CREATININE 0.88  CALCIUM 9.2    Recent Labs  Lab 07/01/18 1501  PROT 6.8  ALBUMIN 4.2  AST 13  ALT 16  ALKPHOS 68  BILITOT 0.6   Hematology Recent Labs  Lab 07/01/18 1501  WBC 7.8  RBC 4.43  HGB  14.1  HCT 41.3  MCV 93.2  MCHC 34.1  RDW 13.3  PLT 374.0   Cardiac EnzymesNo results for input(s): TROPONINI in the last 168 hours. No results for input(s): TROPIPOC in the last 168 hours.  BNP Recent Labs  Lab 07/01/18 1501  PROBNP 24.0    DDimer No results for input(s): DDIMER in the last 168 hours.  Radiology/Studies:  No results found.  Assessment and Plan:   1. 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  Dr. Rayann Heman will see this afternoon, will try to get echo done prior to discharge today   For questions or updates, please contact [place] Please consult [email or web address] for contact info under Cardiology/STEMI.   Signed, Baldwin Jamaica, PA-C  07/08/2018 2:20 PM   I have seen, examined the patient, and reviewed the above assessment and plan.  Changes to above are made where necessary.  On exam, pleasant, bradycardic regular rhythm.  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  No reversible causes have been found.  I would therefore recommend pacemaker implantation at this time.  Risks, benefits, alternatives to pacemaker implantation were discussed in detail with the patient today. The patient understands that the risks include but are not limited to bleeding, infection, pneumothorax, perforation, tamponade, vascular damage, renal failure, MI, stroke, death,  and lead dislodgement and wishes to proceed. We will therefore schedule the procedure at the next available time.  She wishes to go home and return on Thursday for the procedure.  No driving in the interim.  She is aware to return via EMS (911) for any worsening symptoms including  dizziness, presyncope, or syncope   Co Sign: Thompson Grayer, MD 07/08/2018 8:01 PM

## 2018-07-08 NOTE — Research (Signed)
CADFEM Informed Consent   Subject Name: Anna Ellis  Subject met inclusion and exclusion criteria.  The informed consent form, study requirements and expectations were reviewed with the subject and questions and concerns were addressed prior to the signing of the consent form.  The subject verbalized understanding of the trail requirements.  The subject agreed to participate in the CADFEM trial and signed the informed consent.  The informed consent was obtained prior to performance of any protocol-specific procedures for the subject.  A copy of the signed informed consent was given to the subject and a copy was placed in the subject's medical record.  [name] 07/08/2018, 9:40 AM

## 2018-07-08 NOTE — Consult Note (Addendum)
Cardiology Consultation:   Patient ID: [name]; [id number]; 06-15-1951   Admit date: 07/08/2018 Date of Consult: 07/08/2018  Primary Care Provider: Cassandria Anger, MD Primary Cardiologist: Dr. Ellyn Hack Primary Electrophysiologist:  [place], Dr. Rayann Heman   Patient Profile:   Anna Ellis is a 67 y.o. female with a hx of anxiety, asthma, HLD who is being seen today for the evaluation of heart block and PPM at the request of Dr. Ellyn Hack.  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   She observed slower then usual HR when check in on her BP, saw her PMD who noted her to be in 2:1 AVBlock and referred her to cardiology.  She underwent stress testing that was abnormal, wore a holter monitor (returned yesterday, result/tracings unavailable) LHC today noting no significant CAD.  I do not find an echo.  Home meds reviewed, no potential nodal blocking/rate limiting agents. Labs 7/23, unremarkable with TSH wnl as well.   She is s/p cath, feels well.  She tells me she has noticed on occasion in the last few weeks a little dizziness that is not particularly new, historically attributed to known ear/allergy that clears up with OT decongestant type medicines.  She has never fainted, no near syncope.   Past Medical History:  Diagnosis Date  . ALLERGIC RHINITIS 09/19/2007  . Allergic urticaria 12/15/2008  . ANEMIA-IRON DEFICIENCY 09/19/2007  . ANXIETY 09/19/2007  . Arthritis   . Asthma with hay fever 06/15/2011  . Asthma, cough variant 12/15/2008  . Basal cell cancer   . CAROTID BRUIT 01/08/2011  . Hyperlipidemia   . OSTEOARTHRITIS 09/19/2007  . SHOULDER PAIN 01/06/2010    Past Surgical History:  Procedure Laterality Date  . ABDOMINAL HYSTERECTOMY  1990  . ANTERIOR CRUCIATE LIGAMENT REPAIR  2001   LEFT KNEE  . ANTERIOR CRUCIATE LIGAMENT REPAIR  2001  .  BREAST LUMPECTOMY  2002  . CHOLECYSTECTOMY  1994  . HERNIA REPAIR  1970   inguinal- rt  . [place]   left  . TEARDUCT SURGERY  1955  . TONSILLECTOMY AND ADENOIDECTOMY  1968  . TUBAL LIGATION  1982  . WISDOM TOOTH EXTRACTION  1967     Home Medications:  Prior to Admission medications   Medication Sig Start Date End Date Taking? Authorizing Provider  acetaminophen (TYLENOL) 500 MG tablet Take 1,000 mg by mouth every 6 (six) hours as needed (for pain.).   Yes [provider]  acyclovir (ZOVIRAX) 800 MG tablet Take 1 tablet (800 mg total) by mouth 5 (five) times daily. Reported on 03/21/2016 08/05/17  Yes Plotnikov, Evie Lacks, MD  ALPRAZolam Duanne Moron) 0.25 MG tablet Take 1 tablet (0.25 mg total) by mouth 2 (two) times daily as needed. Patient taking differently: Take 0.25 mg by mouth 2 (two) times daily as needed (for anxiety.).  05/01/18  Yes Plotnikov, Evie Lacks, MD  aspirin EC 81 MG tablet Take 81 mg by mouth daily.   Yes [provider]  aspirin-acetaminophen-caffeine (EXCEDRIN MIGRAINE) [phone] MG tablet Take 2 tablets by mouth every 6 (six) hours as needed for headache.   Yes [provider]  atorvastatin (LIPITOR) 20 MG tablet Take 1 tablet (20 mg total) by mouth daily. Patient taking differently: Take 10 mg by mouth every evening.  05/01/18  Yes Plotnikov, Evie Lacks, MD  Cholecalciferol (VITAMIN  D3) 2000 units TABS Take 2,000 Units by mouth daily.    Yes [provider]  etodolac (LODINE) 500 MG tablet TAKE 1 TABLET BY MOUTH TWICE DAILY AS NEEDED WITH FOOD Patient taking differently: Take 500 mg by mouth daily.  05/01/18  Yes Plotnikov, Evie Lacks, MD  mometasone (NASONEX) 50 MCG/ACT nasal spray Place 2 sprays into the nose daily. Patient taking differently: Place 2 sprays into the nose daily as needed (for allergies.).  08/05/17  Yes Plotnikov, Evie Lacks, MD  montelukast (SINGULAIR) 10 MG tablet Take 1 tablet (10 mg total) by mouth daily.  05/01/18 05/01/19 Yes Plotnikov, Evie Lacks, MD  omeprazole (PRILOSEC) 20 MG capsule Take 1 capsule (20 mg total) by mouth 2 (two) times daily before a meal. White caps please Patient taking differently: Take 20 mg by mouth daily before breakfast. White caps please 05/01/18  Yes Plotnikov, Evie Lacks, MD  Polyethyl Glycol-Propyl Glycol (LUBRICANT EYE DROPS) 0.4-0.3 % SOLN Place 1 drop into both eyes 3 (three) times daily as needed (for dry /irritated eyes.).   Yes [provider]  metroNIDAZOLE (METROCREAM) 0.75 % cream Apply 1 application topically daily as needed (for rosacea).    [provider]    Inpatient Medications: Scheduled Meds: . [START ON 07/09/2018] aspirin  81 mg Oral Pre-Cath  . sodium chloride flush  3 mL Intravenous Q12H  . sodium chloride flush  3 mL Intravenous Q12H   Continuous Infusions: . sodium chloride    . sodium chloride 75 mL/hr at 07/08/18 1332  . sodium chloride    . sodium chloride 1 mL/kg/hr (07/08/18 1020)   PRN Meds: sodium chloride, sodium chloride, acetaminophen, ondansetron (ZOFRAN) IV, sodium chloride flush, sodium chloride flush  Allergies:    Allergies  Allergen Reactions  . Clarithromycin Hives  . Food Color Red Hives  . Sulfonamide Derivatives Hives  . Cobalt Rash    Found on Allergy Test  . Parabens Rash    Social History:   Social History   Socioeconomic History  . Marital status: Married    Spouse name: Not on file  . Number of children: 3  . Years of education: Not on file  . Highest education level: High school graduate  Occupational History  . 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 Financial resource strain: Not on file  . Food insecurity:    Worry: Not on file    Inability: Not on file  . Transportation needs:    Medical: Not on file    Non-medical: Not on file  Tobacco Use  . Smoking status: Never Smoker  . Smokeless tobacco: Never Used    Substance and Sexual Activity  . Alcohol use: No  . Drug use: No  . Sexual activity: Yes  Lifestyle  . Physical activity:    Days per week: Not on file    Minutes per session: Not on file  . Stress: Not on file  Relationships  . Social connections:    Talks on phone: Not on file    Gets together: Not on file    Attends religious service: Not on file    Active member of club or organization: Not on file    Attends meetings of clubs or organizations: Not on file    Relationship status: Not on file  . Intimate partner violence:    Fear of current or ex partner: Not on file    Emotionally abused:  Not on file    Physically abused: Not on file    Forced sexual activity: Not on file  Other Topics Concern  . Not on file  Social History Narrative   She is a married (49 years).  They have 3 children and 5 grandchildren.  She is a retired Financial trader for [employer].   She regularly exercises walking and using stairs etc.    Family History:   Family History  Problem Relation Age of Onset  . Breast cancer Mother        breast  . Heart disease Father        h/o Rheumatic Fever - Rheumatic MV Dz.  . Allergies Son   . Asthma Son   . Tuberculosis Maternal Grandmother        died in her 89s  . Throat cancer Maternal Grandfather   . Pneumonia Paternal Grandmother      ROS:  Please see the history of present illness.  All other ROS reviewed and negative.     Physical Exam/Data:   Vitals:   07/08/18 1326 07/08/18 1327 07/08/18 1340 07/08/18 1355  BP: (!) 156/50  (!) 156/55 (!) 152/61  Pulse: (!) 46  (!) 42 (!) 45  Resp: 14     Temp:  97.6 F (36.4 C)    TempSrc:  Oral    SpO2: 100%  99% 99%  Weight:      Height:       No intake or output data in the 24 hours ending 07/08/18 1420 Filed Weights   07/08/18 0814  Weight: 169 lb (76.7 kg)   Body mass index is 29.01 kg/m.  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 The left ventricular ejection fraction is 55-65% by visual estimate.  LV end diastolic pressure is normal.  Prox LAD lesion is 20% stenosed. Mid LAD lesion is 25% stenosed.   Angiographically minimal CAD in the left dominant system. Well preserved LVEF with normal EDP. Persistent 2: 1 AVB with heart rate in the 50s.\  07/04/18: Lexiscan stress test  The left ventricular ejection fraction is normal (55-65%).  Nuclear stress EF: 58%.  No T wave inversion was noted during stress.  There was no ST segment deviation noted during stress.  Defect 1: There is a medium defect of moderate severity.  Findings consistent with ischemia.  This is an intermediate risk study. Medium size, moderate intensity reversible anterior perfusion defect suggestive of ischemia (visually appears worse than SDS score) in the LAD territory. LVEF 58% with normal wall motion. This is a intermediate risk study.     Laboratory Data:  Chemistry Recent Labs  Lab 07/01/18 1501  NA 140  K 3.7  CL 106  CO2 27  GLUCOSE 105*  BUN 21  CREATININE 0.88  CALCIUM 9.2    Recent Labs  Lab 07/01/18 1501  PROT 6.8  ALBUMIN 4.2  AST 13  ALT 16  ALKPHOS 68  BILITOT 0.6   Hematology Recent Labs  Lab 07/01/18 1501  WBC 7.8  RBC 4.43  HGB  14.1  HCT 41.3  MCV 93.2  MCHC 34.1  RDW 13.3  PLT 374.0   Cardiac EnzymesNo results for input(s): TROPONINI in the last 168 hours. No results for input(s): TROPIPOC in the last 168 hours.  BNP Recent Labs  Lab 07/01/18 1501  PROBNP 24.0    DDimer No results for input(s): DDIMER in the last 168 hours.  Radiology/Studies:  No results found.  Assessment and Plan:   1. 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  Dr. Rayann Heman will see this afternoon, will try to get echo done prior to discharge today   For questions or updates, please contact [place] Please consult [email or web address] for contact info under Cardiology/STEMI.   Signed, Baldwin Jamaica, PA-C  07/08/2018 2:20 PM   I have seen, examined the patient, and reviewed the above assessment and plan.  Changes to above are made where necessary.  On exam, pleasant, bradycardic regular rhythm.  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  No reversible causes have been found.  I would therefore recommend pacemaker implantation at this time.  Risks, benefits, alternatives to pacemaker implantation were discussed in detail with the patient today. The patient understands that the risks include but are not limited to bleeding, infection, pneumothorax, perforation, tamponade, vascular damage, renal failure, MI, stroke, death,  and lead dislodgement and wishes to proceed. We will therefore schedule the procedure at the next available time.  She wishes to go home and return on Thursday for the procedure.  No driving in the interim.  She is aware to return via EMS (911) for any worsening symptoms including  dizziness, presyncope, or syncope   Co Sign: Thompson Grayer, MD 07/08/2018 8:01 PM

## 2018-07-08 NOTE — Interval H&P Note (Signed)
History and Physical Interval Note:  07/08/2018 12:03 PM  Anna Ellis  has presented today for surgery, with the diagnosis of abnormal stress test & 2:1 AVB.    The various methods of treatment have been discussed with the patient and family. After consideration of risks, benefits and other options for treatment, the patient has consented to  Procedure(s): LEFT HEART CATH AND CORONARY ANGIOGRAPHY (N/A) with possible PERCUTANEOUS CORONARY INTERVENTION as a surgical intervention .    The patient's history has been reviewed, patient examined, no change in status, stable for surgery.  I have reviewed the patient's chart and labs.  Questions were answered to the patient's satisfaction.    Cath Lab Visit (complete for each Cath Lab visit)  Clinical Evaluation Leading to the Procedure:   ACS: No.  Non-ACS:    Anginal Classification: CCS II  Anti-ischemic medical therapy: Minimal Therapy (1 class of medications)  Non-Invasive Test Results: Intermediate-risk stress test findings: cardiac mortality 1-3%/year  Prior CABG: No previous CABG  Glenetta Hew

## 2018-07-08 NOTE — Discharge Instructions (Signed)
You are scheduled for pacemaker implant with Dr. Rayann Heman Thursday 07/10/18 at 3:30PM Please arrive to [facility] by 1:30PM  Enter the hospital at the main entrance [employer] tower) and check in at the admissions desk  You may have a light breakfast as discussed with Dr. Rayann Heman, though nothing more after 8:00AM. Do not take any of your medicines the morning of your procedure Plan for an over night stay    Radial Site Care Refer to this sheet in the next few weeks. These instructions provide you with information about caring for yourself after your procedure. Your health care provider may also give you more specific instructions. Your treatment has been planned according to current medical practices, but problems sometimes occur. Call your health care provider if you have any problems or questions after your procedure. What can I expect after the procedure? After your procedure, it is typical to have the following:  Bruising at the radial site that usually fades within 1-2 weeks.  Blood collecting in the tissue (hematoma) that may be painful to the touch. It should usually decrease in size and tenderness within 1-2 weeks.  Follow these instructions at home:  Take medicines only as directed by your health care provider.  You may shower 24-48 hours after the procedure or as directed by your health care provider. Remove the bandage (dressing) and gently wash the site with plain soap and water. Pat the area dry with a clean towel. Do not rub the site, because this may cause bleeding.  Do not take baths, swim, or use a hot tub until your health care provider approves.  Check your insertion site every day for redness, swelling, or drainage.  Do not apply powder or lotion to the site.  Do not flex or bend the affected arm for 24 hours or as directed by your health care provider.  Do not push or pull heavy objects with the affected arm for 24 hours or as directed by your health care  provider.  Do not lift over 10 lb (4.5 kg) for 5 days after your procedure or as directed by your health care provider.  Ask your health care provider when it is okay to: ? Return to work or school. ? Resume usual physical activities or sports. ? Resume sexual activity.  Do not drive home if you are discharged the same day as the procedure. Have someone else drive you.  You may drive 24 hours after the procedure unless otherwise instructed by your health care provider.  Do not operate machinery or power tools for 24 hours after the procedure.  If your procedure was done as an outpatient procedure, which means that you went home the same day as your procedure, a responsible adult should be with you for the first 24 hours after you arrive home.  Keep all follow-up visits as directed by your health care provider. This is important. Contact a health care provider if:  You have a fever.  You have chills.  You have increased bleeding from the radial site. Hold pressure on the site. Get help right away if:  You have unusual pain at the radial site.  You have redness, warmth, or swelling at the radial site.  You have drainage (other than a small amount of blood on the dressing) from the radial site.  The radial site is bleeding, and the bleeding does not stop after 30 minutes of holding steady pressure on the site.  Your arm or hand becomes pale,  cool, tingly, or numb. This information is not intended to replace advice given to you by your health care provider. Make sure you discuss any questions you have with your health care provider. Document Released: 12/29/2010 Document Revised: 05/03/2016 Document Reviewed: 06/14/2014 Elsevier Interactive Patient Education  2018 [employer].

## 2018-07-09 ENCOUNTER — Telehealth: Payer: Self-pay | Admitting: Nurse Practitioner

## 2018-07-09 ENCOUNTER — Encounter (HOSPITAL_COMMUNITY): Payer: Self-pay | Admitting: Cardiology

## 2018-07-09 NOTE — Telephone Encounter (Signed)
New Message:     Pt said she was tod her her Echo was cancelled for tomorrow at [facility]. When she called [facility],they said the provider had cancelled it. Pt wanted to be sure that this  was correct.

## 2018-07-10 ENCOUNTER — Encounter (HOSPITAL_COMMUNITY)
Admission: RE | Disposition: A | Discharge status: Home / Self Care | Payer: Self-pay | Source: Ambulatory Visit | Attending: Internal Medicine

## 2018-07-10 ENCOUNTER — Ambulatory Visit (HOSPITAL_COMMUNITY)
Admission: RE | Admit: 2018-07-10 | Discharge: 2018-07-11 | Disposition: A | Discharge status: Home / Self Care | Payer: Medicare Other | Source: Ambulatory Visit | Attending: Internal Medicine | Admitting: Internal Medicine

## 2018-07-10 ENCOUNTER — Ambulatory Visit (HOSPITAL_BASED_OUTPATIENT_CLINIC_OR_DEPARTMENT_OTHER): Payer: Medicare Other

## 2018-07-10 ENCOUNTER — Encounter (HOSPITAL_COMMUNITY): Payer: Self-pay | Admitting: General Practice

## 2018-07-10 ENCOUNTER — Other Ambulatory Visit: Payer: Self-pay

## 2018-07-10 ENCOUNTER — Other Ambulatory Visit (HOSPITAL_COMMUNITY): Payer: Medicare Other

## 2018-07-10 DIAGNOSIS — M1990 Unspecified osteoarthritis, unspecified site: Secondary | ICD-10-CM | POA: Insufficient documentation

## 2018-07-10 DIAGNOSIS — Z888 Allergy status to other drugs, medicaments and biological substances status: Secondary | ICD-10-CM | POA: Insufficient documentation

## 2018-07-10 DIAGNOSIS — I361 Nonrheumatic tricuspid (valve) insufficiency: Secondary | ICD-10-CM

## 2018-07-10 DIAGNOSIS — Z881 Allergy status to other antibiotic agents status: Secondary | ICD-10-CM | POA: Insufficient documentation

## 2018-07-10 DIAGNOSIS — Z950 Presence of cardiac pacemaker: Secondary | ICD-10-CM

## 2018-07-10 DIAGNOSIS — Z85828 Personal history of other malignant neoplasm of skin: Secondary | ICD-10-CM | POA: Diagnosis not present

## 2018-07-10 DIAGNOSIS — F419 Anxiety disorder, unspecified: Secondary | ICD-10-CM | POA: Diagnosis not present

## 2018-07-10 DIAGNOSIS — I441 Atrioventricular block, second degree: Secondary | ICD-10-CM | POA: Diagnosis present

## 2018-07-10 DIAGNOSIS — I051 Rheumatic mitral insufficiency: Secondary | ICD-10-CM | POA: Diagnosis not present

## 2018-07-10 DIAGNOSIS — Z9049 Acquired absence of other specified parts of digestive tract: Secondary | ICD-10-CM | POA: Diagnosis not present

## 2018-07-10 DIAGNOSIS — E785 Hyperlipidemia, unspecified: Secondary | ICD-10-CM | POA: Insufficient documentation

## 2018-07-10 DIAGNOSIS — Z79899 Other long term (current) drug therapy: Secondary | ICD-10-CM | POA: Diagnosis not present

## 2018-07-10 DIAGNOSIS — Z8249 Family history of ischemic heart disease and other diseases of the circulatory system: Secondary | ICD-10-CM | POA: Insufficient documentation

## 2018-07-10 DIAGNOSIS — Z959 Presence of cardiac and vascular implant and graft, unspecified: Secondary | ICD-10-CM

## 2018-07-10 DIAGNOSIS — Z882 Allergy status to sulfonamides status: Secondary | ICD-10-CM | POA: Diagnosis not present

## 2018-07-10 DIAGNOSIS — Z90710 Acquired absence of both cervix and uterus: Secondary | ICD-10-CM | POA: Insufficient documentation

## 2018-07-10 DIAGNOSIS — J45909 Unspecified asthma, uncomplicated: Secondary | ICD-10-CM | POA: Insufficient documentation

## 2018-07-10 DIAGNOSIS — Z825 Family history of asthma and other chronic lower respiratory diseases: Secondary | ICD-10-CM | POA: Diagnosis not present

## 2018-07-10 DIAGNOSIS — Z9889 Other specified postprocedural states: Secondary | ICD-10-CM | POA: Diagnosis not present

## 2018-07-10 DIAGNOSIS — Z7982 Long term (current) use of aspirin: Secondary | ICD-10-CM | POA: Insufficient documentation

## 2018-07-10 HISTORY — DX: Gastro-esophageal reflux disease without esophagitis: K21.9

## 2018-07-10 HISTORY — PX: PACEMAKER IMPLANT: EP1218

## 2018-07-10 HISTORY — DX: Pneumonia, unspecified organism: J18.9

## 2018-07-10 HISTORY — DX: Presence of cardiac pacemaker: Z95.0

## 2018-07-10 LAB — SURGICAL PCR SCREEN
MRSA, PCR: NEGATIVE
Staphylococcus aureus: POSITIVE — AB

## 2018-07-10 LAB — ECHOCARDIOGRAM COMPLETE
Height: 64 in
Weight: 2704 oz

## 2018-07-10 SURGERY — PACEMAKER IMPLANT

## 2018-07-10 MED ORDER — YOU HAVE A PACEMAKER BOOK
Freq: Once | Status: AC
  Administered 2018-07-10: 1
  Filled 2018-07-10: qty 1

## 2018-07-10 MED ORDER — LIDOCAINE HCL (PF) 1 % IJ SOLN
INTRAMUSCULAR | Status: DC | PRN
  Administered 2018-07-10: 52 mL

## 2018-07-10 MED ORDER — LIDOCAINE HCL (PF) 1 % IJ SOLN
INTRAMUSCULAR | Status: AC
  Filled 2018-07-10: qty 30

## 2018-07-10 MED ORDER — MUPIROCIN 2 % EX OINT
1.0000 "application " | TOPICAL_OINTMENT | Freq: Once | CUTANEOUS | Status: AC
  Administered 2018-07-10: 1 via TOPICAL

## 2018-07-10 MED ORDER — HEPARIN (PORCINE) IN NACL 1000-0.9 UT/500ML-% IV SOLN
INTRAVENOUS | Status: AC
  Filled 2018-07-10: qty 500

## 2018-07-10 MED ORDER — SODIUM CHLORIDE 0.9 % IV SOLN
INTRAVENOUS | Status: DC
  Administered 2018-07-10: 15:00:00 via INTRAVENOUS

## 2018-07-10 MED ORDER — SODIUM CHLORIDE 0.9 % IV SOLN
INTRAVENOUS | Status: AC
  Filled 2018-07-10: qty 2

## 2018-07-10 MED ORDER — SODIUM CHLORIDE 0.9% FLUSH: 3.0000 mL | INTRAVENOUS | Status: DC | PRN

## 2018-07-10 MED ORDER — SODIUM CHLORIDE 0.9 % IV SOLN: 250.0000 mL | INTRAVENOUS | Status: DC | PRN

## 2018-07-10 MED ORDER — SODIUM CHLORIDE 0.9 % IV SOLN
80.0000 mg | INTRAVENOUS | Status: AC
  Administered 2018-07-10: 80 mg

## 2018-07-10 MED ORDER — FENTANYL CITRATE (PF) 100 MCG/2ML IJ SOLN
INTRAMUSCULAR | Status: DC | PRN
  Administered 2018-07-10 (×2): 12.5 ug via INTRAVENOUS

## 2018-07-10 MED ORDER — CEFAZOLIN SODIUM-DEXTROSE 2-4 GM/100ML-% IV SOLN
INTRAVENOUS | Status: AC
  Filled 2018-07-10: qty 100

## 2018-07-10 MED ORDER — IOPAMIDOL (ISOVUE-370) INJECTION 76%
INTRAVENOUS | Status: DC | PRN
  Administered 2018-07-10: 15 mL via INTRAVENOUS

## 2018-07-10 MED ORDER — CEFAZOLIN SODIUM-DEXTROSE 2-4 GM/100ML-% IV SOLN
2.0000 g | INTRAVENOUS | Status: AC
  Administered 2018-07-10: 2 g via INTRAVENOUS

## 2018-07-10 MED ORDER — ONDANSETRON HCL 4 MG/2ML IJ SOLN: 4.0000 mg | Freq: Four times a day (QID) | INTRAMUSCULAR | Status: DC | PRN

## 2018-07-10 MED ORDER — CHLORHEXIDINE GLUCONATE 4 % EX LIQD: 60.0000 mL | Freq: Once | CUTANEOUS | Status: DC

## 2018-07-10 MED ORDER — SODIUM CHLORIDE 0.9 % IV SOLN: 250.0000 mL | INTRAVENOUS | Status: DC

## 2018-07-10 MED ORDER — MIDAZOLAM HCL 5 MG/5ML IJ SOLN
INTRAMUSCULAR | Status: DC | PRN
  Administered 2018-07-10 (×2): 1 mg via INTRAVENOUS

## 2018-07-10 MED ORDER — CEFAZOLIN SODIUM-DEXTROSE 1-4 GM/50ML-% IV SOLN
1.0000 g | Freq: Four times a day (QID) | INTRAVENOUS | Status: AC
  Administered 2018-07-10 – 2018-07-11 (×3): 1 g via INTRAVENOUS
  Filled 2018-07-10 (×3): qty 50

## 2018-07-10 MED ORDER — POLYETHYL GLYCOL-PROPYL GLYCOL 0.4-0.3 % OP SOLN: 1.0000 [drp] | Freq: Three times a day (TID) | OPHTHALMIC | Status: DC | PRN

## 2018-07-10 MED ORDER — ACETAMINOPHEN 325 MG PO TABS
325.0000 mg | ORAL_TABLET | ORAL | Status: DC | PRN
  Administered 2018-07-10 (×2): 650 mg via ORAL
  Filled 2018-07-10 (×2): qty 2

## 2018-07-10 MED ORDER — SODIUM CHLORIDE 0.9% FLUSH: 3.0000 mL | Freq: Two times a day (BID) | INTRAVENOUS | Status: DC

## 2018-07-10 MED ORDER — MUPIROCIN 2 % EX OINT
1.0000 "application " | TOPICAL_OINTMENT | Freq: Two times a day (BID) | CUTANEOUS | Status: DC
  Administered 2018-07-10: 1 via NASAL

## 2018-07-10 MED ORDER — HEPARIN (PORCINE) IN NACL 2-0.9 UNITS/ML
INTRAMUSCULAR | Status: DC | PRN
  Administered 2018-07-10: 500 mL

## 2018-07-10 MED ORDER — MIDAZOLAM HCL 5 MG/5ML IJ SOLN
INTRAMUSCULAR | Status: AC
  Filled 2018-07-10: qty 5

## 2018-07-10 MED ORDER — CHLORHEXIDINE GLUCONATE CLOTH 2 % EX PADS
6.0000 | MEDICATED_PAD | Freq: Every day | CUTANEOUS | Status: DC
  Administered 2018-07-10: 6 via TOPICAL

## 2018-07-10 MED ORDER — MUPIROCIN 2 % EX OINT
TOPICAL_OINTMENT | CUTANEOUS | Status: AC
  Administered 2018-07-10: 1 via TOPICAL
  Filled 2018-07-10: qty 22

## 2018-07-10 MED ORDER — ALPRAZOLAM 0.25 MG PO TABS: 0.2500 mg | ORAL_TABLET | Freq: Two times a day (BID) | ORAL | Status: DC | PRN

## 2018-07-10 MED ORDER — FENTANYL CITRATE (PF) 100 MCG/2ML IJ SOLN
INTRAMUSCULAR | Status: AC
  Filled 2018-07-10: qty 2

## 2018-07-10 MED ORDER — SODIUM CHLORIDE 0.9% FLUSH
3.0000 mL | Freq: Two times a day (BID) | INTRAVENOUS | Status: DC
  Administered 2018-07-10: 3 mL via INTRAVENOUS

## 2018-07-10 SURGICAL SUPPLY — 12 items
CABLE SURGICAL S-101-97-12 (CABLE) ×3 IMPLANT
CATH RIGHTSITE C315HIS02 (CATHETERS) ×1 IMPLANT
IPG PACE AZUR XT DR MRI W1DR01 (Pacemaker) IMPLANT
LEAD CAPSURE NOVUS 5076-52CM (Lead) ×1 IMPLANT
LEAD SELECT SECURE 3830 383069 (Lead) IMPLANT
PACE AZURE XT DR MRI W1DR01 (Pacemaker) ×2 IMPLANT
PAD DEFIB LIFELINK (PAD) ×1 IMPLANT
SELECT SECURE 3830 383069 (Lead) ×2 IMPLANT
SHEATH CLASSIC 7F (SHEATH) ×2 IMPLANT
SLITTER 6232ADJ (MISCELLANEOUS) ×1 IMPLANT
TRAY PACEMAKER INSERTION (PACKS) ×2 IMPLANT
WIRE HI TORQ VERSACORE-J 145CM (WIRE) ×1 IMPLANT

## 2018-07-10 NOTE — Interval H&P Note (Signed)
History and Physical Interval Note:  07/10/2018 2:43 PM  Anna Ellis  has presented today for surgery, with the diagnosis of hb  The various methods of treatment have been discussed with the patient and family. After consideration of risks, benefits and other options for treatment, the patient has consented to  Procedure(s): PACEMAKER IMPLANT (N/A) as a surgical intervention .  The patient's history has been reviewed, patient examined, no change in status, stable for surgery.  I have reviewed the patient's chart and labs.  Questions were answered to the patient's satisfaction.     Thompson Grayer

## 2018-07-10 NOTE — Progress Notes (Signed)
  Echocardiogram 2D Echocardiogram has been performed.  Anna Ellis 07/10/2018, 2:25 PM

## 2018-07-10 NOTE — Discharge Instructions (Signed)
° ° °  Supplemental Discharge Instructions for  Pacemaker/Defibrillator Patients  Activity No heavy lifting or vigorous activity with your left/right arm for 6 to 8 weeks.  Do not raise your left/right arm above your head for one week.  Gradually raise your affected arm as drawn below.               07/14/18                      07/15/18                      07/16/18                     07/17/18 __  NO DRIVING for  1 week   ; you may begin driving on  06/16/66 .  WOUND CARE - Keep the wound area clean and dry.  Do not get this area wet, no showers until cleared to at your wound check visit . - The tape/steri-strips on your wound will fall off; do not pull them off.  No bandage is needed on the site.  DO  NOT apply any creams, oils, or ointments to the wound area. - If you notice any drainage or discharge from the wound, any swelling or bruising at the site, or you develop a fever > 101? F after you are discharged home, call the office at once.  Special Instructions - You are still able to use cellular telephones; use the ear opposite the side where you have your pacemaker/defibrillator.  Avoid carrying your cellular phone near your device. - When traveling through airports, show security personnel your identification card to avoid being screened in the metal detectors.  Ask the security personnel to use the hand wand. - Avoid arc welding equipment, MRI testing (magnetic resonance imaging), TENS units (transcutaneous nerve stimulators).  Call the office for questions about other devices. - Avoid electrical appliances that are in poor condition or are not properly grounded. - Microwave ovens are safe to be near or to operate.  Additional information for defibrillator patients should your device go off: - If your device goes off ONCE and you feel fine afterward, notify the device clinic nurses. - If your device goes off ONCE and you do not feel well afterward, call 911. - If your device goes off TWICE,  call 911. - If your device goes off THREE times in one day, call 911.  DO NOT DRIVE YOURSELF OR A FAMILY MEMBER WITH A DEFIBRILLATOR TO THE HOSPITAL--CALL 911.

## 2018-07-11 ENCOUNTER — Ambulatory Visit (HOSPITAL_COMMUNITY): Payer: Medicare Other

## 2018-07-11 ENCOUNTER — Encounter (HOSPITAL_COMMUNITY): Payer: Self-pay | Admitting: Internal Medicine

## 2018-07-11 DIAGNOSIS — I051 Rheumatic mitral insufficiency: Secondary | ICD-10-CM | POA: Diagnosis not present

## 2018-07-11 DIAGNOSIS — E785 Hyperlipidemia, unspecified: Secondary | ICD-10-CM | POA: Diagnosis not present

## 2018-07-11 DIAGNOSIS — F419 Anxiety disorder, unspecified: Secondary | ICD-10-CM | POA: Diagnosis not present

## 2018-07-11 DIAGNOSIS — I441 Atrioventricular block, second degree: Secondary | ICD-10-CM | POA: Diagnosis not present

## 2018-07-11 LAB — BASIC METABOLIC PANEL
Anion gap: 9 (ref 5–15)
BUN: 16 mg/dL (ref 8–23)
CO2: 22 mmol/L (ref 22–32)
Calcium: 8.9 mg/dL (ref 8.9–10.3)
Chloride: 111 mmol/L (ref 98–111)
Creatinine, Ser: 0.73 mg/dL (ref 0.44–1.00)
GFR calc Af Amer: >60 mL/min (ref >60)
GFR calc non Af Amer: >60 mL/min (ref >60)
Glucose, Bld: 95 mg/dL (ref 70–99)
Potassium: 3.9 mmol/L (ref 3.5–5.1)
Sodium: 142 mmol/L (ref 135–145)

## 2018-07-11 NOTE — Discharge Summary (Addendum)
ELECTROPHYSIOLOGY PROCEDURE DISCHARGE SUMMARY    Patient ID: Anna Ellis,  MRN: [id number], DOB/AGE: Jun 04, 1951 67 y.o.  Admit date: 07/10/2018 Discharge date: 07/11/2018  Primary Care Physician: Cassandria Anger, MD  Primary Cardiologist: Dr. Ellyn Hack Electrophysiologist: Dr. Rayann Heman  Primary Discharge Diagnosis:  1. Advanced heart Block, Mobitz II 2. Symptomatic bradycardia  Secondary Discharge Diagnosis:  1. Anxiety 2. HLD 3. Asthma/allergies  Allergies  Allergen Reactions  . Clarithromycin Hives  . Food Color Red Hives  . Sulfonamide Derivatives Hives  . Cobalt Rash    Found on Allergy Test  . Parabens Rash     Procedures This Admission:  1.  Implantation of a MDT dual chamber PPM on 07/10/18 by Dr Rayann Heman.  The patient received a  There were no immediate post procedure complications. 2.  CXR on demonstrated no pneumothorax status post device implantation.   Brief HPI: Anna Ellis is a 67 y.o. female was referred to electrophysiology in the outpatient setting for consideration of PPM implantation.  Past medical history includes above.  The patient has had advanced heart block/symptomatic bradycardia without reversible causes identified.  Risks, benefits, and alternatives to PPM implantation were reviewed with the patient who wished to proceed.   Hospital Course:  The patient was admitted, TTE was done, noted LVEF 55-60%, mild MR, and underwent implantation of a PPM with details as outlined above. She  was monitored on telemetry overnight which demonstrated SR, occ AP and VP.  Left chest was without hematoma or ecchymosis.  The device was interrogated and found to be functioning normally.  CXR was obtained and demonstrated no pneumothorax status post device implantation.  Wound care, arm mobility, and restrictions were reviewed with the patient.  The patient feels well this morning, no c/o SOB or CP, she was examined by Dr. Curt Bears and considered stable for  discharge to home.    Physical Exam: Vitals:   07/10/18 1707 07/10/18 1925 07/10/18 1930 07/11/18 0239  BP: (!) 168/72 (!) 110/55  (!) 161/68  Pulse: 62 86 77   Resp: 13 20 20 14   Temp: 98.3 F (36.8 C) 98 F (36.7 C)  (!) 97.5 F (36.4 C)  TempSrc: Oral Oral  Oral  SpO2: 100% 96% 95% 98%  Weight:      Height:        GEN- The patient is well appearing, alert and oriented x 3 today.   HEENT: normocephalic, atraumatic; sclera clear, conjunctiva pink; hearing intact; oropharynx clear; neck supple, no JVP Lungs- CTA b/l  normal work of breathing.  No wheezes, rales, rhonchi Heart- RRR, no murmurs, rubs or gallops, PMI not laterally displaced GI- soft, non-tender, non-distended Extremities- no clubbing, cyanosis, or edema MS- no significant deformity or atrophy Skin- warm and dry, no rash or lesion, left chest without hematoma/ecchymosis Psych- euthymic mood, full affect Neuro- no gross deficits   Labs:   Lab Results  Component Value Date   WBC 7.8 07/01/2018   HGB 14.1 07/01/2018   HCT 41.3 07/01/2018   MCV 93.2 07/01/2018   PLT 374.0 07/01/2018    Recent Labs  Lab 07/11/18 0346  NA 142  K 3.9  CL 111  CO2 22  BUN 16  CREATININE 0.73  CALCIUM 8.9  GLUCOSE 95    Discharge Medications:  Allergies as of 07/11/2018      Reactions   Clarithromycin Hives   Food Color Red Hives   Sulfonamide Derivatives Hives   Cobalt Rash   Found  on Allergy Test   Parabens Rash      Medication List    TAKE these medications   acetaminophen 500 MG tablet Commonly known as:  TYLENOL Take 1,000 mg by mouth every 6 (six) hours as needed (for pain.). Notes to patient:  Monitor for all sources of acetaminophen, not to exceed recommended maximum daily dose from all sources   acyclovir 800 MG tablet Commonly known as:  ZOVIRAX Take 1 tablet (800 mg total) by mouth 5 (five) times daily. Reported on 03/21/2016   ALPRAZolam 0.25 MG tablet Commonly known as:  XANAX Take 1 tablet  (0.25 mg total) by mouth 2 (two) times daily as needed. What changed:  reasons to take this   aspirin EC 81 MG tablet Take 81 mg by mouth daily.   aspirin-acetaminophen-caffeine 250-250-65 MG tablet Commonly known as:  EXCEDRIN MIGRAINE Take 2 tablets by mouth every 6 (six) hours as needed for headache. Notes to patient:  Monitor for all sources of acetaminophen, not to exceed recommended maximum daily dose from all sources   atorvastatin 20 MG tablet Commonly known as:  LIPITOR Take 1 tablet (20 mg total) by mouth daily. What changed:    how much to take  when to take this   etodolac 500 MG tablet Commonly known as:  LODINE TAKE 1 TABLET BY MOUTH TWICE DAILY AS NEEDED WITH FOOD What changed:    how much to take  how to take this  when to take this  additional instructions   LUBRICANT EYE DROPS 0.4-0.3 % Soln Generic drug:  Polyethyl Glycol-Propyl Glycol Place 1 drop into both eyes 3 (three) times daily as needed (for dry /irritated eyes.).   metroNIDAZOLE 0.75 % cream Commonly known as:  METROCREAM Apply 1 application topically daily as needed (for rosacea).   mometasone 50 MCG/ACT nasal spray Commonly known as:  NASONEX Place 2 sprays into the nose daily. What changed:    when to take this  reasons to take this   montelukast 10 MG tablet Commonly known as:  SINGULAIR Take 1 tablet (10 mg total) by mouth daily.   omeprazole 20 MG capsule Commonly known as:  PRILOSEC Take 1 capsule (20 mg total) by mouth 2 (two) times daily before a meal. White caps please What changed:    when to take this  additional instructions   Vitamin D3 2000 units Tabs Take 2,000 Units by mouth daily.       Disposition:  Home   Discharge Instructions    Diet - low sodium heart healthy   Complete by:  As directed    Increase activity slowly   Complete by:  As directed      Follow-up Information    [facility] Follow up on 07/23/2018.     Specialty:  Cardiology Why:  2:00PM, wound check visit Contact information: [address], [place]       Thompson Grayer, MD Follow up on 10/15/2018.   Specialty:  Cardiology Why:  1:45PM Contact information: [place] [address] [phone]           Duration of Discharge Encounter: Greater than 30 minutes including physician time.  Venetia Night, PA-C 07/11/2018 8:12 AM   Thompson Grayer MD, [facility] 07/12/2018 11:22 AM

## 2018-07-23 ENCOUNTER — Ambulatory Visit (INDEPENDENT_AMBULATORY_CARE_PROVIDER_SITE_OTHER): Payer: Medicare Other | Admitting: *Deleted

## 2018-07-23 DIAGNOSIS — I441 Atrioventricular block, second degree: Secondary | ICD-10-CM | POA: Diagnosis not present

## 2018-07-23 LAB — CUP PACEART INCLINIC DEVICE CHECK
Battery Remaining Longevity: 159 mo
Battery Voltage: 3.19 V
Brady Statistic AP VP Percent: 17.13 %
Brady Statistic AP VS Percent: 2.55 %
Brady Statistic AS VP Percent: 10.01 %
Brady Statistic AS VS Percent: 70.31 %
Brady Statistic RA Percent Paced: 19.7 %
Brady Statistic RV Percent Paced: 27.17 %
Date Time Interrogation Session: 20190814180208
Implantable Lead Implant Date: 20190801
Implantable Lead Implant Date: 20190801
Implantable Lead Location: 753859
Implantable Lead Location: 753860
Implantable Lead Model: 3830
Implantable Lead Model: 5076
Implantable Pulse Generator Implant Date: 20190801
Lead Channel Impedance Value: 323 Ohm
Lead Channel Impedance Value: 342 Ohm
Lead Channel Impedance Value: 437 Ohm
Lead Channel Impedance Value: 437 Ohm
Lead Channel Pacing Threshold Amplitude: 0.625 V
Lead Channel Pacing Threshold Amplitude: 0.625 V
Lead Channel Pacing Threshold Pulse Width: 0.4 ms
Lead Channel Pacing Threshold Pulse Width: 0.4 ms
Lead Channel Sensing Intrinsic Amplitude: 2.25 mV
Lead Channel Sensing Intrinsic Amplitude: 2.875 mV
Lead Channel Sensing Intrinsic Amplitude: 5.625 mV
Lead Channel Sensing Intrinsic Amplitude: 7.25 mV
Lead Channel Setting Pacing Amplitude: 3.5 V
Lead Channel Setting Pacing Amplitude: 3.5 V
Lead Channel Setting Pacing Pulse Width: 1 ms
Lead Channel Setting Sensing Sensitivity: 1.2 mV

## 2018-07-23 NOTE — Progress Notes (Signed)
Wound check appointment. Steri-strips removed. Wound without redness or edema. Incision edges approximated, wound well healed. Normal device function. Thresholds, sensing, and impedances consistent with implant measurements. HBP lead tested with use of rhythm strip, septal capture noted from 6V until LOC. Device programmed at 3.5V for extra safety margin until 3 month visit. Histogram distribution appropriate for patient and level of activity. (19) AT/AF episodes recorded - FFRW per EGMs, sensitivity decreased from 0.68mV to 0.71mV. No high ventricular rates noted. Patient educated about wound care, arm mobility, lifting restrictions. ROV in 6 weeks with AS, 3 months with JA.

## 2018-07-31 ENCOUNTER — Encounter: Payer: Self-pay | Admitting: Cardiology

## 2018-07-31 ENCOUNTER — Ambulatory Visit (INDEPENDENT_AMBULATORY_CARE_PROVIDER_SITE_OTHER): Payer: Medicare Other | Admitting: Cardiology

## 2018-07-31 VITALS — BP 148/84 | HR 81 | Ht 64.0 in | Wt 172.2 lb

## 2018-07-31 DIAGNOSIS — R001 Bradycardia, unspecified: Secondary | ICD-10-CM | POA: Diagnosis not present

## 2018-07-31 DIAGNOSIS — I493 Ventricular premature depolarization: Secondary | ICD-10-CM

## 2018-07-31 DIAGNOSIS — I441 Atrioventricular block, second degree: Secondary | ICD-10-CM

## 2018-07-31 DIAGNOSIS — R9439 Abnormal result of other cardiovascular function study: Secondary | ICD-10-CM | POA: Diagnosis not present

## 2018-07-31 DIAGNOSIS — R0789 Other chest pain: Secondary | ICD-10-CM

## 2018-07-31 NOTE — Patient Instructions (Signed)
NO CHANGE WITH MEDICATIONS      Your physician recommends that you schedule a follow-up appointment -- CONTINUE  TO SEE DR Rayann Heman AND AMBER.

## 2018-07-31 NOTE — Progress Notes (Signed)
PCP: Plotnikov, Evie Lacks, MD  Clinic Note: Chief Complaint  Patient presents with  . Hospitalization Follow-up    doing well  . Bradycardia    s/p Pacer    HPI: Anna Ellis is a 67 y.o. female with a PMH below who presents today for Hospital F/u s/p PPM for symptomatic Bradycardia - 2:1 AVB.  [name] was seen on July 02, 2018 for symptoms of exertional dyspnea, bradycardia and noted to be in 2-1 AV block.  She was in 2:1 AV block during my evaluation and persistently on cardiac monitor.  She then had a stress test that showed potential evidence of anterior ischemia.  She was therefore referred for cardiac catheterization the following week.  When that was shown to be nonischemic, she was seen by electrophysiology while in the Cath Lab postop unit and was scheduled for pacemaker the following day.  Recent Hospitalizations: Cath 7/30 - PPM 8/1  Studies Personally Reviewed - (if available, images/films reviewed: From Epic Chart or Care Everywhere)  Myoview 07/04/2018: EF 55-65%.  Medium sized moderate severity anterior defect concerning for ischemia.  INTERMEDIATE RISK.  No RWMA -- referred for Cath (FALSE POSITIVE - Breast Attenuation vs. Septal bounce)  Holter Monitor 07/07/2018: Itermittent 2;1 heart block with narrow complex escape rhythm rate in the 40s.  Rare PVCs, junctional escape beats and rare ventricular escape  LCP-Cors 07/08/2018: Angiographically normal coronary arteries except ~20-25% prox & mid LAD.  Normal LVEDP.  TTE 07/10/2018: EF 55-60%. No RWMA.  Mild MR.   PPM 07/10/2018: (Dr. Rayann Heman) L SCV - Medtronic model [id number] (serial number [id number]) right atrial lead and a Medtronic model 3830-69 (serial number [id number]) right ventricular lead were advanced with fluoroscopic visualization into the right atrial appendage and His bundle positions respectively. --> Medtronic Azure XT DR MRI SureScan model [id number] (serial number [id number]) pacemaker  Interval  History: [name] returns today following her pacemaker feeling quite well.  She says her blood pressure is usually much better than it is today.  She has not had any further episodes of exertional dyspnea or chest tightness with exertion since pacemaker placement.  She actually feels pretty good now she just has a little bit of soreness at pacemaker site.  No episodes of near syncope or syncope.  No PND orthopnea or edema.  No sensation of palpitations or irregular heartbeats.  No exercise related fatigue.  No TIA/amaurosis fugax symptoms. No melena, hematochezia, hematuria, or epstaxis. No claudication.  ROS: No recent illnesses fevers, chills or sweats.  No signs of infection at cath site or PPM site   I have reviewed and (if needed) personally updated the patient's problem list, medications, allergies, past medical and surgical history, social and family history.   Past Medical History:  Diagnosis Date  . ALLERGIC RHINITIS 09/19/2007  . Allergic urticaria 12/15/2008   "w/red dye"  . ANEMIA-IRON DEFICIENCY 09/19/2007  . ANXIETY 09/19/2007  . Arthritis    "neck" (07/10/2018)  . Asthma with hay fever 06/15/2011  . Asthma, cough variant 12/15/2008  . Basal cell cancer    "back of my neck; scraped it off"  . CAROTID BRUIT 01/08/2011  . Chronic left hip pain    "better w/chiropractor" (07/10/2018)  . GERD (gastroesophageal reflux disease)   . Hyperlipidemia   . OSTEOARTHRITIS 09/19/2007  . Pneumonia    "once" (07/10/2018)  . Presence of permanent cardiac pacemaker 07/10/2018   Medtronic Azure XT DR MRI SureScan model [id number] (serial number [id number])  pacemaker  . SHOULDER PAIN 01/06/2010    Past Surgical History:  Procedure Laterality Date  . ANTERIOR CRUCIATE LIGAMENT REPAIR Left 2001   "donor ACL"  . BREAST LUMPECTOMY Bilateral 2002   "both benign"  . HOLTER MONITOR  07/07/2018   Itermittent 2;1 heart block with narrow complex escape rhythm rate in the 40s.  Rare PVCs, junctional  escape beats and rare ventricular escape  . INGUINAL HERNIA REPAIR Right 1971  . INSERT / REPLACE / REMOVE PACEMAKER  07/10/2018  . KNEE CARTILAGE SURGERY Left 1986   w/ACL repair  . LAPAROSCOPIC CHOLECYSTECTOMY  1994  . LEFT HEART CATH AND CORONARY ANGIOGRAPHY N/A 07/08/2018   Procedure: LEFT HEART CATH AND CORONARY ANGIOGRAPHY;  Surgeon: Leonie Man, MD;  Location: [place] [facility];  Service: Cardiovascular;; Angiographically normal coronary arteries except ~20-25% prox & mid LAD.  Normal LVEDP.  Marland Kitchen NM MYOVIEW LTD  07/04/2018   EF 55-65%.  Medium sized moderate severity anterior defect concerning for ischemia.  INTERMEDIATE RISK.  No RWMA -- referred for Cath (FALSE POSITIVE - Breast Attenuation vs. Septal bounce)  . PACEMAKER IMPLANT N/A 07/10/2018   Procedure: PACEMAKER IMPLANT;  Surgeon: Thompson Grayer, MD;  Location: [place] [facility];  Service: Cardiovascular;  Laterality: L SCV -.-> Medtronic model [id number] (serial number [id number]) RA lead and a Medtronic model 3830-69 (serial number [id number]) RV lead --> RAP and His bundle positions respectively  --> Medtronic Azure XT DR MRI SureScan Model [id number]  (serial number [id number]) PPM  . TEAR DUCT PROBING  1955  . TONSILLECTOMY AND ADENOIDECTOMY  1968  . TRANSTHORACIC ECHOCARDIOGRAM  07/10/2018   EF 55-60%. No RWMA.  Mild MR.   . TUBAL LIGATION  1982  . VAGINAL HYSTERECTOMY  1990  . WISDOM TOOTH EXTRACTION  1967    Current Meds  Medication Sig  . acetaminophen (TYLENOL) 500 MG tablet Take 1,000 mg by mouth every 6 (six) hours as needed (for pain.).  Marland Kitchen acyclovir (ZOVIRAX) 800 MG tablet Take 1 tablet (800 mg total) by mouth 5 (five) times daily. Reported on 03/21/2016  . ALPRAZolam (XANAX) 0.25 MG tablet Take 1 tablet (0.25 mg total) by mouth 2 (two) times daily as needed. (Patient taking differently: Take 0.25 mg by mouth 2 (two) times daily as needed (for anxiety.). )  . aspirin EC 81 MG tablet Take 81 mg by mouth daily.  Marland Kitchen  aspirin-acetaminophen-caffeine (EXCEDRIN MIGRAINE) 250-250-65 MG tablet Take 2 tablets by mouth every 6 (six) hours as needed for headache.  Marland Kitchen atorvastatin (LIPITOR) 20 MG tablet Take 10 mg by mouth daily. 1/2 tablet(10mg ) daily. Insurance will only pay for 20mg  tablet.  . Cholecalciferol (VITAMIN D3) 2000 units TABS Take 2,000 Units by mouth daily.   Marland Kitchen etodolac (LODINE) 500 MG tablet TAKE 1 TABLET BY MOUTH TWICE DAILY AS NEEDED WITH FOOD (Patient taking differently: Take 500 mg by mouth daily. )  . metroNIDAZOLE (METROCREAM) 0.75 % cream Apply 1 application topically daily as needed (for rosacea).  . mometasone (NASONEX) 50 MCG/ACT nasal spray Place 2 sprays into the nose daily. (Patient taking differently: Place 2 sprays into the nose daily as needed (for allergies.). )  . montelukast (SINGULAIR) 10 MG tablet Take 1 tablet (10 mg total) by mouth daily.  Marland Kitchen omeprazole (PRILOSEC) 20 MG capsule Take 1 capsule (20 mg total) by mouth 2 (two) times daily before a meal. White caps please (Patient taking differently: Take 20 mg by mouth daily before breakfast. White caps  please)    Allergies  Allergen Reactions  . Clarithromycin Hives  . Food Color Red Hives  . Sulfonamide Derivatives Hives  . Cobalt Rash    Found on Allergy Test  . Parabens Rash    Social History   Tobacco Use  . Smoking status: Never Smoker  . Smokeless tobacco: Never Used  Substance Use Topics  . Alcohol use: Never    Frequency: Never  . Drug use: Never   Social History   Social History Narrative   She is a married (67 years).  They have 3 children and 5 grandchildren.  She is a retired Financial trader for [employer].   She regularly exercises walking and using stairs etc.    family history includes Allergies in her son; Asthma in her son; Breast cancer in her mother; Heart disease in her father; Pneumonia in her paternal grandmother; Throat cancer in her maternal grandfather; Tuberculosis in her  maternal grandmother.  Wt Readings from Last 3 Encounters:  07/31/18 172 lb 3.2 oz (78.1 kg)  07/10/18 169 lb (76.7 kg)  07/08/18 169 lb (76.7 kg)    PHYSICAL EXAM BP (!) 148/84   Pulse 81   Ht 5\' 4"  (1.626 m)   Wt 172 lb 3.2 oz (78.1 kg)   BMI 29.56 kg/m  Physical Exam  Constitutional: She is oriented to person, place, and time. She appears well-developed and well-nourished. No distress.  Neck: No hepatojugular reflux and no JVD present. Carotid bruit is not present.  Cardiovascular: Normal rate, regular rhythm, normal heart sounds, intact distal pulses and normal pulses.  No extrasystoles are present. PMI is not displaced. Exam reveals no gallop and no friction rub.  No murmur heard. PPM site - mildly sore, but no erythema or warmth. No swelling.  Pulmonary/Chest: Effort normal and breath sounds normal. No respiratory distress. She has no wheezes.  Musculoskeletal: Normal range of motion. She exhibits no edema.  Neurological: She is alert and oriented to person, place, and time.  Psychiatric: Judgment and thought content normal.  Vitals reviewed.    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      Relevant Medications   atorvastatin (LIPITOR) 20 MG tablet   Abnormal nuclear stress test    FALSE POSITIVE - Minimal CAD on Cath. Suspect breast attenuation vs. Relation to 2:1 AVB with septal bounce.      Chest tightness    Probably demand ischemia from 2:1 AVB --> minimal CAD on Cath.      Frequent PVCs    Minimal CAD & normal LVEF - mostly electrical problem. Now s/p PPM      Relevant Medications   atorvastatin (LIPITOR) 20 MG tablet   Symptomatic bradycardia - Primary    S/p PPM. Doing well.  Will  follow-up in EP clinic with Dr. Rayann Heman & team.          I spent a total of 20 minutes with the patient and chart review. >  50% of the time was spent in direct patient consultation.   Current medicines are reviewed at length with the patient today.  (+/- concerns) n/a The following changes have been made:  n/a  Patient Instructions  NO CHANGE WITH MEDICATIONS      Your physician recommends that you schedule a follow-up appointment -- CONTINUE  TO SEE DR Rayann Heman AND AMBER.    Studies Ordered:   No orders of the defined types were placed in this encounter.     Glenetta Hew, M.D., M.S. Interventional Cardiologist   Pager # [phone] Phone # [phone] [address] [place], [place] [address]   Thank you for choosing [facility] at [facility]!!

## 2018-08-03 ENCOUNTER — Encounter: Payer: Self-pay | Admitting: Cardiology

## 2018-08-03 NOTE — Assessment & Plan Note (Signed)
Persistent 2:1 AVB with Sx -- now s/p PPM  - can f/u with EP going forward.

## 2018-08-03 NOTE — Assessment & Plan Note (Signed)
S/p PPM. Doing well.  Will follow-up in EP clinic with Dr. Rayann Heman & team.

## 2018-08-03 NOTE — Assessment & Plan Note (Signed)
Probably demand ischemia from 2:1 AVB --> minimal CAD on Cath.

## 2018-08-03 NOTE — Assessment & Plan Note (Signed)
Minimal CAD & normal LVEF - mostly electrical problem. Now s/p PPM

## 2018-08-03 NOTE — Assessment & Plan Note (Signed)
FALSE POSITIVE - Minimal CAD on Cath. Suspect breast attenuation vs. Relation to 2:1 AVB with septal bounce.

## 2018-09-17 ENCOUNTER — Encounter: Payer: Self-pay | Admitting: Nurse Practitioner

## 2018-09-17 NOTE — Progress Notes (Signed)
Electrophysiology Office Note Date: 09/18/2018  ID:  Anna Ellis, Anna Ellis 03-02-1951, MRN [id number]  PCP: Cassandria Anger, MD Primary Cardiologist: Ellyn Hack Electrophysiologist: Allred  CC: Pacemaker follow-up  Anna Ellis is a 67 y.o. female seen today for Dr Rayann Heman.  She presents today for routine electrophysiology followup.  Since last being seen in our clinic, the patient reports doing reasonably well. Post pacemaker, she developed a rash on her neck with whelps that were painful.  She eventually was able to have some triamcinolone cream called in which helped.  Her pacemaker is also high and medial on her chest wall and causes some discomfort.   She denies chest pain, palpitations, dyspnea, PND, orthopnea, nausea, vomiting, dizziness, syncope, edema, weight gain, or early satiety.  Device History: MDT dual chamber (His Bundle) PPM implanted 2019 for 2:1 AV block    Past Medical History:  Diagnosis Date  . ALLERGIC RHINITIS 09/19/2007  . ANEMIA-IRON DEFICIENCY 09/19/2007  . ANXIETY 09/19/2007  . Arthritis    "neck" (07/10/2018)  . Basal cell cancer    "back of my neck; scraped it off"  . CAROTID BRUIT 01/08/2011  . GERD (gastroesophageal reflux disease)   . Hyperlipidemia   . Mobitz II    a. s/p MDT dual chamber (His Bundle) pacemaker 07/2018 - Dr Rayann Heman  . OSTEOARTHRITIS 09/19/2007   Past Surgical History:  Procedure Laterality Date  . ANTERIOR CRUCIATE LIGAMENT REPAIR Left 2001   "donor ACL"  . BREAST LUMPECTOMY Bilateral 2002   "both benign"  . INGUINAL HERNIA REPAIR Right 1971  . KNEE CARTILAGE SURGERY Left 1986   w/ACL repair  . LAPAROSCOPIC CHOLECYSTECTOMY  1994  . LEFT HEART CATH AND CORONARY ANGIOGRAPHY N/A 07/08/2018   Procedure: LEFT HEART CATH AND CORONARY ANGIOGRAPHY;  Surgeon: Leonie Man, MD;  Location: [place] [facility];  Service: Cardiovascular;; Angiographically normal coronary arteries except ~20-25% prox & mid LAD.  Normal LVEDP.  Marland Kitchen  PACEMAKER IMPLANT N/A 07/10/2018   Procedure: PACEMAKER IMPLANT;  Surgeon: Thompson Grayer, MD;  Location: [place] [facility];  Service: Cardiovascular;  Laterality: L SCV -.-> Medtronic model [id number] (serial number [id number]) RA lead and a Medtronic model 3830-69 (serial number [id number]) RV lead --> RAP and His bundle positions respectively  --> Medtronic Azure XT DR MRI SureScan Model [id number]  (serial number [id number]) PPM  . TEAR DUCT PROBING  1955  . TONSILLECTOMY AND ADENOIDECTOMY  1968  . TUBAL LIGATION  1982  . VAGINAL HYSTERECTOMY  1990  . WISDOM TOOTH EXTRACTION  1967    Current Outpatient Medications  Medication Sig Dispense Refill  . acetaminophen (TYLENOL) 500 MG tablet Take 1,000 mg by mouth every 6 (six) hours as needed (for pain.).    Marland Kitchen acyclovir (ZOVIRAX) 800 MG tablet Take 1 tablet (800 mg total) by mouth 5 (five) times daily. Reported on 03/21/2016 35 tablet 1  . ALPRAZolam (XANAX) 0.25 MG tablet Take 1 tablet (0.25 mg total) by mouth 2 (two) times daily as needed. 30 tablet 1  . aspirin EC 81 MG tablet Take 81 mg by mouth daily.    Marland Kitchen aspirin-acetaminophen-caffeine (EXCEDRIN MIGRAINE) 250-250-65 MG tablet Take 2 tablets by mouth every 6 (six) hours as needed for headache.    Marland Kitchen atorvastatin (LIPITOR) 20 MG tablet Take 10 mg by mouth daily. 1/2 tablet(10mg ) daily. Insurance will only pay for 20mg  tablet.    . Cholecalciferol (VITAMIN D3) 2000 units TABS Take 2,000 Units by mouth daily.     Marland Kitchen  etodolac (LODINE) 500 MG tablet TAKE 1 TABLET BY MOUTH TWICE DAILY AS NEEDED WITH FOOD 180 tablet 3  . metroNIDAZOLE (METROCREAM) 0.75 % cream Apply 1 application topically daily as needed (for rosacea).    . mometasone (NASONEX) 50 MCG/ACT nasal spray Place 2 sprays into the nose daily. 17 g 3  . montelukast (SINGULAIR) 10 MG tablet Take 1 tablet (10 mg total) by mouth daily. 90 tablet 3  . omeprazole (PRILOSEC) 20 MG capsule Take 1 capsule (20 mg total) by mouth 2 (two) times daily before  a meal. White caps please 60 capsule 11   No current facility-administered medications for this visit.     Allergies:   Clarithromycin; Food color red; Sulfonamide derivatives; Cobalt; and Parabens   Social History: Social History   Socioeconomic History  . Marital status: Married    Spouse name: Not on file  . Number of children: 3  . Years of education: Not on file  . Highest education level: High school graduate  Occupational History  . 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 Financial resource strain: Not on file  . Food insecurity:    Worry: Not on file    Inability: Not on file  . Transportation needs:    Medical: Not on file    Non-medical: Not on file  Tobacco Use  . Smoking status: Never Smoker  . Smokeless tobacco: Never Used  Substance and Sexual Activity  . Alcohol use: Never    Frequency: Never  . Drug use: Never  . Sexual activity: Yes  Lifestyle  . Physical activity:    Days per week: Not on file    Minutes per session: Not on file  . Stress: Not on file  Relationships  . Social connections:    Talks on phone: Not on file    Gets together: Not on file    Attends religious service: Not on file    Active member of club or organization: Not on file    Attends meetings of clubs or organizations: Not on file    Relationship status: Not on file  . Intimate partner violence:    Fear of current or ex partner: Not on file    Emotionally abused: Not on file    Physically abused: Not on file    Forced sexual activity: Not on file  Other Topics Concern  . Not on file  Social History Narrative   She is a married (49 years).  They have 3 children and 5 grandchildren.  She is a retired Financial trader for [employer].   She regularly exercises walking and using stairs etc.    Family History: Family History  Problem Relation Age of Onset  . Breast cancer Mother        breast  .  Heart disease Father        h/o Rheumatic Fever - Rheumatic MV Dz.  . Allergies Son   . Asthma Son   . Tuberculosis Maternal Grandmother        died in her 85s  . Throat cancer Maternal Grandfather   . Pneumonia Paternal Grandmother      Review of Systems: All other systems reviewed and are otherwise negative except as noted above.   Physical Exam: VS:  BP 120/88   Pulse 61   Ht 5\' 4"  (1.626 m)   Wt 175 lb (79.4 kg)   SpO2 98%  BMI 30.04 kg/m  , BMI Body mass index is 30.04 kg/m.  GEN- The patient is well appearing, alert and oriented x 3 today.   HEENT: normocephalic, atraumatic; sclera clear, conjunctiva pink; hearing intact; oropharynx clear; neck supple  Lungs- Clear to ausculation bilaterally, normal work of breathing.  No wheezes, rales, rhonchi Heart- Regular rate and rhythm  GI- soft, non-tender, non-distended, bowel sounds present  Extremities- no clubbing, cyanosis, or edema  MS- no significant deformity or atrophy Skin- warm and dry, no rash or lesion; PPM pocket well healed Psych- euthymic mood, full affect Neuro- strength and sensation are intact  PPM Interrogation- reviewed in detail today,  See PACEART report  EKG:  EKG is ordered today. The ekg ordered today shows sinus rhythm  Recent Labs: 07/01/2018: ALT 16; Hemoglobin 14.1; Platelets 374.0; Pro B Natriuretic peptide (BNP) 24.0; TSH 2.22 07/11/2018: BUN 16; Creatinine, Ser 0.73; Potassium 3.9; Sodium 142   Wt Readings from Last 3 Encounters:  09/18/18 175 lb (79.4 kg)  07/31/18 172 lb 3.2 oz (78.1 kg)  07/10/18 169 lb (76.7 kg)     Other studies Reviewed: Additional studies/ records that were reviewed today include: hospital records, Dr Harding's office notes  Assessment and Plan:  1.  Mobitz II Doing well s/p PPM Normal PPM function See Pace Art report No changes today His Bundle capture septal today down to LOC at 0.5V at 54msec.   2.  HTN Stable No change required today  3.   Reaction to ?drape She had a fairly significant reaction to something after her pacemaker was implanted. Consider pre-medication at time of gen change   Current medicines are reviewed at length with the patient today.   The patient does not have concerns regarding her medicines.  The following changes were made today:  none  Labs/ tests ordered today include: none Orders Placed This Encounter  Procedures  . EKG 12-Lead     Disposition:   Follow up with Dr Rayann Heman as scheduled   Signed, Chanetta Marshall, NP 09/18/2018 10:12 AM  [facility] [place]   [id number] [phone] (office) [phone] (fax)

## 2018-09-18 ENCOUNTER — Ambulatory Visit (INDEPENDENT_AMBULATORY_CARE_PROVIDER_SITE_OTHER): Payer: Medicare Other | Admitting: Nurse Practitioner

## 2018-09-18 ENCOUNTER — Encounter: Payer: Self-pay | Admitting: Nurse Practitioner

## 2018-09-18 VITALS — BP 120/88 | HR 61 | Ht 64.0 in | Wt 175.0 lb

## 2018-09-18 DIAGNOSIS — I10 Essential (primary) hypertension: Secondary | ICD-10-CM

## 2018-09-18 DIAGNOSIS — I441 Atrioventricular block, second degree: Secondary | ICD-10-CM

## 2018-09-18 LAB — CUP PACEART INCLINIC DEVICE CHECK
Date Time Interrogation Session: 20191010111011
Implantable Lead Implant Date: 20190801
Implantable Lead Implant Date: 20190801
Implantable Lead Location: 753859
Implantable Lead Location: 753860
Implantable Lead Model: 3830
Implantable Lead Model: 5076
Implantable Pulse Generator Implant Date: 20190801

## 2018-09-18 NOTE — Patient Instructions (Addendum)
Medication Instructions:  Your physician recommends that you continue on your current medications as directed. Please refer to the Current Medication list given to you today.  -- If you need a refill on your cardiac medications before your next appointment, please call your pharmacy. --  Labwork: None ordered  Testing/Procedures: None ordered  Follow-Up:  Your physician wants you to follow-up with Dr Rayann Heman 11/6  Thank you for choosing [facility]!!    Any Other Special Instructions Will Be Listed Below (If Applicable).

## 2018-10-15 ENCOUNTER — Ambulatory Visit (INDEPENDENT_AMBULATORY_CARE_PROVIDER_SITE_OTHER): Payer: Medicare Other | Admitting: Internal Medicine

## 2018-10-15 ENCOUNTER — Encounter: Payer: Self-pay | Admitting: Internal Medicine

## 2018-10-15 VITALS — BP 136/80 | HR 64 | Ht 64.0 in | Wt 174.6 lb

## 2018-10-15 DIAGNOSIS — I10 Essential (primary) hypertension: Secondary | ICD-10-CM | POA: Diagnosis not present

## 2018-10-15 DIAGNOSIS — I441 Atrioventricular block, second degree: Secondary | ICD-10-CM | POA: Diagnosis not present

## 2018-10-15 LAB — CUP PACEART INCLINIC DEVICE CHECK
Battery Remaining Longevity: 155 mo
Battery Voltage: 3.16 V
Brady Statistic AP VP Percent: 28.74 %
Brady Statistic AP VS Percent: 3.2 %
Brady Statistic AS VP Percent: 4.69 %
Brady Statistic AS VS Percent: 63.37 %
Brady Statistic RA Percent Paced: 31.92 %
Brady Statistic RV Percent Paced: 33.43 %
Date Time Interrogation Session: 20191106141500
Implantable Lead Implant Date: 20190801
Implantable Lead Implant Date: 20190801
Implantable Lead Location: 753859
Implantable Lead Location: 753860
Implantable Lead Model: 3830
Implantable Lead Model: 5076
Implantable Pulse Generator Implant Date: 20190801
Lead Channel Impedance Value: 304 Ohm
Lead Channel Impedance Value: 342 Ohm
Lead Channel Impedance Value: 399 Ohm
Lead Channel Impedance Value: 418 Ohm
Lead Channel Pacing Threshold Amplitude: 0.5 V
Lead Channel Pacing Threshold Amplitude: 0.75 V
Lead Channel Pacing Threshold Pulse Width: 0.4 ms
Lead Channel Pacing Threshold Pulse Width: 0.4 ms
Lead Channel Sensing Intrinsic Amplitude: 2.875 mV
Lead Channel Sensing Intrinsic Amplitude: 3.875 mV
Lead Channel Sensing Intrinsic Amplitude: 5 mV
Lead Channel Sensing Intrinsic Amplitude: 6 mV
Lead Channel Setting Pacing Amplitude: 2 V
Lead Channel Setting Pacing Amplitude: 2.5 V
Lead Channel Setting Pacing Pulse Width: 0.4 ms
Lead Channel Setting Sensing Sensitivity: 1.2 mV

## 2018-10-15 NOTE — Progress Notes (Signed)
PCP: Cassandria Anger, MD Primary Cardiologist: Dr Ellyn Hack Primary EP:  Dr Rayann Heman  Anna Ellis is a 67 y.o. female who presents today for routine electrophysiology followup.  Since last being seen in our clinic, the patient reports doing very well.  Today, she denies symptoms of palpitations, chest pain, shortness of breath,  lower extremity edema, dizziness, presyncope, or syncope.  The patient is otherwise without complaint today.   Past Medical History:  Diagnosis Date  . ALLERGIC RHINITIS 09/19/2007  . ANEMIA-IRON DEFICIENCY 09/19/2007  . ANXIETY 09/19/2007  . Arthritis    "neck" (07/10/2018)  . Basal cell cancer    "back of my neck; scraped it off"  . CAROTID BRUIT 01/08/2011  . GERD (gastroesophageal reflux disease)   . Hyperlipidemia   . Mobitz II    a. s/p MDT dual chamber (His Bundle) pacemaker 07/2018 - Dr Rayann Heman  . OSTEOARTHRITIS 09/19/2007   Past Surgical History:  Procedure Laterality Date  . ANTERIOR CRUCIATE LIGAMENT REPAIR Left 2001   "donor ACL"  . BREAST LUMPECTOMY Bilateral 2002   "both benign"  . INGUINAL HERNIA REPAIR Right 1971  . KNEE CARTILAGE SURGERY Left 1986   w/ACL repair  . LAPAROSCOPIC CHOLECYSTECTOMY  1994  . LEFT HEART CATH AND CORONARY ANGIOGRAPHY N/A 07/08/2018   Procedure: LEFT HEART CATH AND CORONARY ANGIOGRAPHY;  Surgeon: Leonie Man, MD;  Location: [facility];  Service: Cardiovascular;; Angiographically normal coronary arteries except ~20-25% prox & mid LAD.  Normal LVEDP.  Marland Kitchen PACEMAKER IMPLANT N/A 07/10/2018   Medtronic Azure XT DR MRI SureScan model [id number] (serial number [id number]) pacemaker by Dr Rayann Heman with His bundle lead  . [place]  . TONSILLECTOMY AND ADENOIDECTOMY  1968  . TUBAL LIGATION  1982  . VAGINAL HYSTERECTOMY  1990  . WISDOM TOOTH EXTRACTION  1967    ROS- all systems are reviewed and negative except as per HPI above  Current Outpatient Medications  Medication Sig Dispense Refill    . acetaminophen (TYLENOL) 500 MG tablet Take 1,000 mg by mouth every 6 (six) hours as needed (for pain.).    Marland Kitchen acyclovir (ZOVIRAX) 800 MG tablet Take 1 tablet (800 mg total) by mouth 5 (five) times daily. Reported on 03/21/2016 35 tablet 1  . ALPRAZolam (XANAX) 0.25 MG tablet Take 1 tablet (0.25 mg total) by mouth 2 (two) times daily as needed. 30 tablet 1  . aspirin EC 81 MG tablet Take 81 mg by mouth daily.    Marland Kitchen aspirin-acetaminophen-caffeine (EXCEDRIN MIGRAINE) 250-250-65 MG tablet Take 2 tablets by mouth every 6 (six) hours as needed for headache.    Marland Kitchen atorvastatin (LIPITOR) 20 MG tablet Take 10 mg by mouth daily. 1/2 tablet(10mg ) daily. Insurance will only pay for 20mg  tablet.    . Cholecalciferol (VITAMIN D3) 2000 units TABS Take 2,000 Units by mouth daily.     Marland Kitchen etodolac (LODINE) 500 MG tablet TAKE 1 TABLET BY MOUTH TWICE DAILY AS NEEDED WITH FOOD 180 tablet 3  . metroNIDAZOLE (METROCREAM) 0.75 % cream Apply 1 application topically daily as needed (for rosacea).    . mometasone (NASONEX) 50 MCG/ACT nasal spray Place 2 sprays into the nose daily. 17 g 3  . montelukast (SINGULAIR) 10 MG tablet Take 1 tablet (10 mg total) by mouth daily. 90 tablet 3  . omeprazole (PRILOSEC) 20 MG capsule Take 1 capsule (20 mg total) by mouth 2 (two) times daily before a meal. White caps please 60  capsule 11   No current facility-administered medications for this visit.     Physical Exam: Vitals:   10/15/18 1350  BP: 136/80  Pulse: 64  SpO2: 97%  Weight: 174 lb 9.6 oz (79.2 kg)  Height: 5\' 4"  (1.626 m)    GEN- The patient is well appearing, alert and oriented x 3 today.   Head- normocephalic, atraumatic Eyes-  Sclera clear, conjunctiva pink Ears- hearing intact Oropharynx- clear Lungs- Clear to ausculation bilaterally, normal work of breathing Chest- pacemaker pocket is well healed Heart- Regular rate and rhythm, no murmurs, rubs or gallops, PMI not laterally displaced GI- soft, NT, ND, +  BS Extremities- no clubbing, cyanosis, or edema  Pacemaker interrogation- reviewed in detail today,  See PACEART report  ekg tracing ordered today is personally reviewed and shows sinus rhythm  Assessment and Plan:  1. 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 HTN She reports that her BP is elevated at the doctors office.  She has home values that are occasionally within the hypertensive range but are mostly well controlled. We discussed lifestyle modification. No indication for medical therapy currently  [facility] Return to see EP NP every year I will see when needed  Thompson Grayer MD, [facility] 10/15/2018 2:19 PM

## 2018-10-15 NOTE — Patient Instructions (Signed)
Medication Instructions:  Your physician recommends that you continue on your current medications as directed. Please refer to the Current Medication list given to you today.  If you need a refill on your cardiac medications before your next appointment, please call your pharmacy.   Lab work: None Ordered  If you have labs (blood work) drawn today and your tests are completely normal, you will receive your results only by: Marland Kitchen MyChart Message (if you have MyChart) OR . A paper copy in the mail If you have any lab test that is abnormal or we need to change your treatment, we will call you to review the results.  Testing/Procedures: None ordered  Follow-Up:  Remote monitoring is used to monitor your Pacemaker of ICD from home. This monitoring reduces the number of office visits required to check your device to one time per year. It allows [place] to keep an eye on the functioning of your device to ensure it is working properly. You are scheduled for a device check from home on 01/14/19. You may send your transmission at any time that day. If you have a wireless device, the transmission will be sent automatically. After your physician reviews your transmission, you will receive a postcard with your next transmission date.  . Your physician wants you to follow-up in: 1 year with Chanetta Marshall, NP. You will receive a reminder letter in the mail two months in advance. If you don't receive a letter, please call our office to schedule the follow-up appointment.  Any Other Special Instructions Will Be Listed Below (If Applicable).

## 2019-01-14 ENCOUNTER — Ambulatory Visit (INDEPENDENT_AMBULATORY_CARE_PROVIDER_SITE_OTHER): Payer: Medicare Other

## 2019-01-14 DIAGNOSIS — R0789 Other chest pain: Secondary | ICD-10-CM

## 2019-01-14 DIAGNOSIS — I441 Atrioventricular block, second degree: Secondary | ICD-10-CM | POA: Diagnosis not present

## 2019-01-16 LAB — CUP PACEART REMOTE DEVICE CHECK
Battery Remaining Longevity: 151 mo
Battery Voltage: 3.11 V
Brady Statistic AP VP Percent: 20.87 %
Brady Statistic AP VS Percent: 2.7 %
Brady Statistic AS VP Percent: 20.73 %
Brady Statistic AS VS Percent: 55.7 %
Brady Statistic RA Percent Paced: 23.55 %
Brady Statistic RV Percent Paced: 41.6 %
Date Time Interrogation Session: 20200204232257
Implantable Lead Implant Date: 20190801
Implantable Lead Implant Date: 20190801
Implantable Lead Location: 753859
Implantable Lead Location: 753860
Implantable Lead Model: 3830
Implantable Lead Model: 5076
Implantable Pulse Generator Implant Date: 20190801
Lead Channel Impedance Value: 323 Ohm
Lead Channel Impedance Value: 361 Ohm
Lead Channel Impedance Value: 418 Ohm
Lead Channel Impedance Value: 437 Ohm
Lead Channel Pacing Threshold Amplitude: 0.625 V
Lead Channel Pacing Threshold Amplitude: 1 V
Lead Channel Pacing Threshold Pulse Width: 0.4 ms
Lead Channel Pacing Threshold Pulse Width: 0.4 ms
Lead Channel Sensing Intrinsic Amplitude: 3 mV
Lead Channel Sensing Intrinsic Amplitude: 3 mV
Lead Channel Sensing Intrinsic Amplitude: 5.875 mV
Lead Channel Sensing Intrinsic Amplitude: 5.875 mV
Lead Channel Setting Pacing Amplitude: 2 V
Lead Channel Setting Pacing Amplitude: 2.5 V
Lead Channel Setting Pacing Pulse Width: 0.4 ms
Lead Channel Setting Sensing Sensitivity: 1.2 mV

## 2019-01-23 NOTE — Progress Notes (Signed)
Remote pacemaker transmission.   

## 2019-02-20 ENCOUNTER — Ambulatory Visit (INDEPENDENT_AMBULATORY_CARE_PROVIDER_SITE_OTHER): Payer: Medicare Other | Admitting: Family

## 2019-02-20 ENCOUNTER — Other Ambulatory Visit: Payer: Self-pay

## 2019-02-20 ENCOUNTER — Encounter: Payer: Self-pay | Admitting: Family

## 2019-02-20 VITALS — BP 130/70 | HR 105 | Temp 97.9°F | Ht 64.0 in | Wt 174.0 lb

## 2019-02-20 DIAGNOSIS — J0140 Acute pansinusitis, unspecified: Secondary | ICD-10-CM | POA: Diagnosis not present

## 2019-02-20 DIAGNOSIS — R030 Elevated blood-pressure reading, without diagnosis of hypertension: Secondary | ICD-10-CM

## 2019-02-20 MED ORDER — PREDNISONE 10 MG PO TABS: ORAL_TABLET | ORAL | 0 refills | Status: AC

## 2019-02-20 MED ORDER — CEFUROXIME AXETIL 500 MG PO TABS: 500.0000 mg | ORAL_TABLET | Freq: Two times a day (BID) | ORAL | 0 refills | Status: DC

## 2019-02-20 MED ORDER — MOMETASONE FUROATE 50 MCG/ACT NA SUSP: 2.0000 | Freq: Every day | NASAL | 3 refills | Status: DC

## 2019-02-20 NOTE — Progress Notes (Signed)
Anna Ellis is a 68 y.o. female with the following history as recorded in EpicCare:  Patient Active Problem List   Diagnosis Date Noted  . Second degree Mobitz II AV block 07/10/2018  . Abnormal nuclear stress test 07/04/2018  . 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 Symptomatic bradycardia 07/02/2018  . Chest tightness 07/01/2018  . 2nd degree AV block 07/01/2018  . Tongue discoloration 11/19/2017  . Sore throat 09/03/2016  . 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 Mouth ulcer 02/24/2013  . Pain in right eye 10/30/2012  . Well adult exam 01/14/2012  . Serum potassium elevated 01/14/2012  . Cystitis 01/14/2012  . Cerumen impaction 11/19/2011  . Nausea 09/11/2011  . Fibrocystic breast disease 08/06/2011  . Family history of breast cancer 08/06/2011  . GERD (gastroesophageal reflux disease) 07/11/2011  . Asthma with hay fever 06/15/2011  . CAROTID BRUIT 01/08/2011  . Headache 07/24/2010  . Nonspecific (abnormal) findings on radiological and other examination of body structure 01/25/2010  . ABNORMAL CHEST XRAY 01/25/2010  . SHOULDER PAIN 01/06/2010  . URTICARIA 12/27/2008  . Allergic urticaria 12/15/2008  . Asthma, cough variant 12/15/2008  . ELEVATED BLOOD PRESSURE WITHOUT DIAGNOSIS OF HYPERTENSION 12/15/2008  . Sinusitis 11/19/2007  . HYPERLIPIDEMIA 09/19/2007  . ANEMIA-IRON DEFICIENCY 09/19/2007  . Anxiety state 09/19/2007  . Allergic rhinitis 09/19/2007  . OSTEOARTHRITIS 09/19/2007  . RASH-NONVESICULAR 09/19/2007    Current Outpatient Medications  Medication Sig Dispense Refill  . aspirin EC 81 MG tablet Take 81 mg by mouth daily.    Marland Kitchen atorvastatin (LIPITOR) 20 MG tablet Take 10 mg by mouth daily. 1/2 tablet(10mg ) daily. Insurance will only pay for 20mg  tablet.    . Cholecalciferol (VITAMIN D3) 2000 units TABS Take 2,000 Units by mouth daily.     Marland Kitchen etodolac (LODINE) 500 MG tablet TAKE 1 TABLET BY MOUTH TWICE DAILY AS NEEDED WITH FOOD 180  tablet 3  . montelukast (SINGULAIR) 10 MG tablet Take 1 tablet (10 mg total) by mouth daily. 90 tablet 3  . omeprazole (PRILOSEC) 20 MG capsule Take 1 capsule (20 mg total) by mouth 2 (two) times daily before a meal. White caps please 60 capsule 11  . acetaminophen (TYLENOL) 500 MG tablet Take 1,000 mg by mouth every 6 (six) hours as needed (for pain.).    Marland Kitchen ALPRAZolam (XANAX) 0.25 MG tablet Take 1 tablet (0.25 mg total) by mouth 2 (two) times daily as needed. (Patient not taking: Reported on 02/20/2019) 30 tablet 1  . aspirin-acetaminophen-caffeine (EXCEDRIN MIGRAINE) 250-250-65 MG tablet Take 2 tablets by mouth every 6 (six) hours as needed for headache.    . cefUROXime (CEFTIN) 500 MG tablet Take 1 tablet (500 mg total) by mouth 2 (two) times daily. 20 tablet 0  . metroNIDAZOLE (METROCREAM) 0.75 % cream Apply 1 application topically daily as needed (for rosacea).    . mometasone (NASONEX) 50 MCG/ACT nasal spray Place 2 sprays into the nose daily. 17 g 3  . predniSONE (DELTASONE) 10 MG tablet Prednisone 10 mg: take 4 tabs a day x 3 days; then 3 tabs a day x 4 days; then 2 tabs a day x 4 days, then 1 tab a day x 6 days, then stop. Take pc. 38 tablet 0   No current facility-administered medications for this visit.     Allergies: Clarithromycin; Food color red; Sulfonamide derivatives; Cobalt; and Parabens  Past Medical History:  Diagnosis Date  . ALLERGIC RHINITIS 09/19/2007  . ANEMIA-IRON DEFICIENCY  09/19/2007  . ANXIETY 09/19/2007  . Arthritis    "neck" (07/10/2018)  . Basal cell cancer    "back of my neck; scraped it off"  . CAROTID BRUIT 01/08/2011  . GERD (gastroesophageal reflux disease)   . Hyperlipidemia   . Mobitz II    a. s/p MDT dual chamber (His Bundle) pacemaker 07/2018 - Dr Rayann Heman  . OSTEOARTHRITIS 09/19/2007    Past Surgical History:  Procedure Laterality Date  . ANTERIOR CRUCIATE LIGAMENT REPAIR Left 2001   "donor ACL"  . BREAST LUMPECTOMY Bilateral 2002   "both benign"   . INGUINAL HERNIA REPAIR Right 1971  . KNEE CARTILAGE SURGERY Left 1986   w/ACL repair  . LAPAROSCOPIC CHOLECYSTECTOMY  1994  . LEFT HEART CATH AND CORONARY ANGIOGRAPHY N/A 07/08/2018   Procedure: LEFT HEART CATH AND CORONARY ANGIOGRAPHY;  Surgeon: Leonie Man, MD;  Location: [facility];  Service: Cardiovascular;; Angiographically normal coronary arteries except ~20-25% prox & mid LAD.  Normal LVEDP.  Marland Kitchen PACEMAKER IMPLANT N/A 07/10/2018   Medtronic Azure XT DR MRI SureScan model [id number] (serial number [id number]) pacemaker by Dr Rayann Heman with His bundle lead  . [place]  . TONSILLECTOMY AND ADENOIDECTOMY  1968  . TUBAL LIGATION  1982  . VAGINAL HYSTERECTOMY  1990  . WISDOM TOOTH EXTRACTION  1967    Family History  Problem Relation Age of Onset  . Breast cancer Mother        breast  . Heart disease Father        h/o Rheumatic Fever - Rheumatic MV Dz.  . Allergies Son   . Asthma Son   . Tuberculosis Maternal Grandmother        died in her 78s  . Throat cancer Maternal Grandfather   . Pneumonia Paternal Grandmother     Social History   Tobacco Use  . Smoking status: Never Smoker  . Smokeless tobacco: Never Used  Substance Use Topics  . Alcohol use: Never    Frequency: Never    Subjective:  Patient presents with vague symptoms of headache/ "ear fullness" x 1 week; does have seasonal allergies- only taking Singulair; not currently on Nasonex; notes that she typically get recurrent sinus infection on right side of her head/ sinus- symptoms originally felt like they "were heading that way." Became concerned when blood pressure appeared to be elevated in the past few days- started to question if elevated blood pressure was causing headache/ ear fullness; no prior history of hypertension; Denies any chest pain, shortness of breath, blurred vision or dizziness.     Objective:  Vitals:   02/20/19 1403  BP: 130/70  Pulse: (!) 105  Temp: 97.9 F (36.6 C)   TempSrc: Oral  SpO2: 98%  Weight: 174 lb 0.6 oz (78.9 kg)  Height: 5\' 4"  (1.626 m)    General: Well developed, well nourished, in no acute distress  Skin : Warm and dry.  Head: Normocephalic and atraumatic  Eyes: Sclera and conjunctiva clear; pupils round and reactive to light; extraocular movements intact  Ears: External normal; canals clear; tympanic membranes normal  Oropharynx: Pink, supple. No suspicious lesions  Neck: Supple without thyromegaly, adenopathy  Lungs: Respirations unlabored; clear to auscultation bilaterally without wheeze, rales, rhonchi  CVS exam: normal rate and regular rhythm.  Neurologic: Alert and oriented; speech intact; face symmetrical; moves all extremities well; CNII-XII intact without focal deficit   Assessment:  1. Acute non-recurrent pansinusitis   2. Elevated blood pressure  reading     Plan:  Am suspicious that sinus infection is causing occasional elevated blood pressure readings; she notes she has not been contacted by cardiologist with any concerns regarding her pacemaker; Will treat for sinus infection with regimen that has traditionally been very successful for her; she will continue to monitor her blood pressure and if it remains elevated, follow-up with her PCP for treatment options.   No follow-ups on file.  No orders of the defined types were placed in this encounter.   Requested Prescriptions   Signed Prescriptions Disp Refills  . mometasone (NASONEX) 50 MCG/ACT nasal spray 17 g 3    Sig: Place 2 sprays into the nose daily.  . cefUROXime (CEFTIN) 500 MG tablet 20 tablet 0    Sig: Take 1 tablet (500 mg total) by mouth 2 (two) times daily.  . predniSONE (DELTASONE) 10 MG tablet 38 tablet 0    Sig: Prednisone 10 mg: take 4 tabs a day x 3 days; then 3 tabs a day x 4 days; then 2 tabs a day x 4 days, then 1 tab a day x 6 days, then stop. Take pc.

## 2019-02-24 LAB — HM MAMMOGRAPHY

## 2019-02-26 ENCOUNTER — Encounter: Payer: Self-pay | Admitting: Internal Medicine

## 2019-02-26 NOTE — Progress Notes (Signed)
Outside notes received. Information abstracted. Notes sent to scan.  

## 2019-04-22 ENCOUNTER — Other Ambulatory Visit: Payer: Self-pay

## 2019-04-22 ENCOUNTER — Ambulatory Visit (INDEPENDENT_AMBULATORY_CARE_PROVIDER_SITE_OTHER): Payer: Medicare Other | Admitting: *Deleted

## 2019-04-22 DIAGNOSIS — I441 Atrioventricular block, second degree: Secondary | ICD-10-CM | POA: Diagnosis not present

## 2019-04-22 DIAGNOSIS — R001 Bradycardia, unspecified: Secondary | ICD-10-CM

## 2019-04-22 LAB — CUP PACEART REMOTE DEVICE CHECK
Battery Remaining Longevity: 144 mo
Battery Voltage: 3.06 V
Brady Statistic AP VP Percent: 30.86 %
Brady Statistic AP VS Percent: 3.08 %
Brady Statistic AS VP Percent: 18.9 %
Brady Statistic AS VS Percent: 47.16 %
Brady Statistic RA Percent Paced: 33.96 %
Brady Statistic RV Percent Paced: 49.76 %
Date Time Interrogation Session: 20200513143206
Implantable Lead Implant Date: 20190801
Implantable Lead Implant Date: 20190801
Implantable Lead Location: 753859
Implantable Lead Location: 753860
Implantable Lead Model: 3830
Implantable Lead Model: 5076
Implantable Pulse Generator Implant Date: 20190801
Lead Channel Impedance Value: 323 Ohm
Lead Channel Impedance Value: 342 Ohm
Lead Channel Impedance Value: 399 Ohm
Lead Channel Impedance Value: 418 Ohm
Lead Channel Pacing Threshold Amplitude: 0.75 V
Lead Channel Pacing Threshold Amplitude: 0.875 V
Lead Channel Pacing Threshold Pulse Width: 0.4 ms
Lead Channel Pacing Threshold Pulse Width: 0.4 ms
Lead Channel Sensing Intrinsic Amplitude: 3.125 mV
Lead Channel Sensing Intrinsic Amplitude: 3.125 mV
Lead Channel Sensing Intrinsic Amplitude: 4.875 mV
Lead Channel Sensing Intrinsic Amplitude: 4.875 mV
Lead Channel Setting Pacing Amplitude: 2 V
Lead Channel Setting Pacing Amplitude: 2.5 V
Lead Channel Setting Pacing Pulse Width: 0.4 ms
Lead Channel Setting Sensing Sensitivity: 1.2 mV

## 2019-05-11 IMAGING — DX DG CHEST 2V
2 series · 2 of 2 positions shown · non-contrast
Comparison: 01/23/2010

CLINICAL DATA: Implanted cardiac device

EXAM:
CHEST - 2 VIEW

[chest pa]
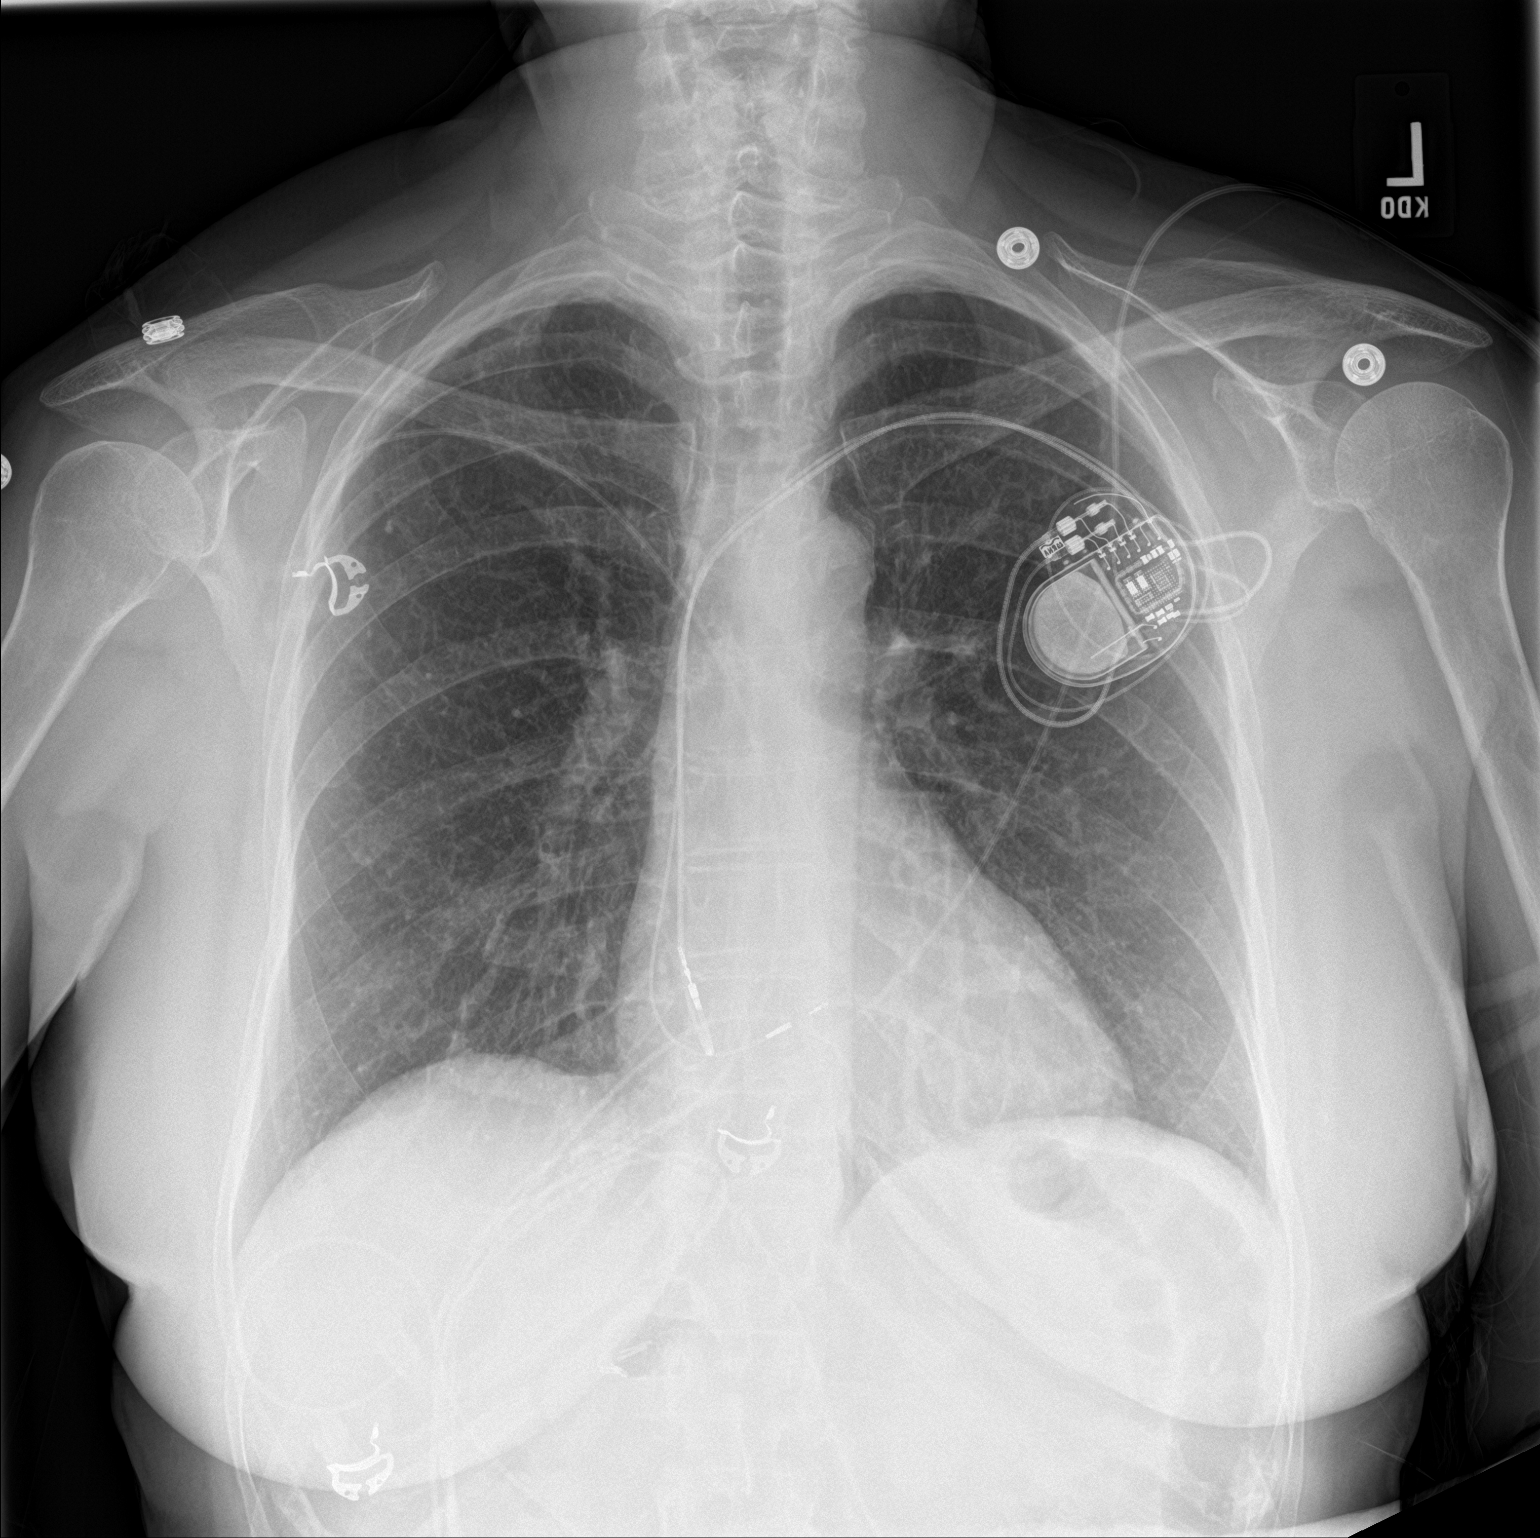

[chest lat]
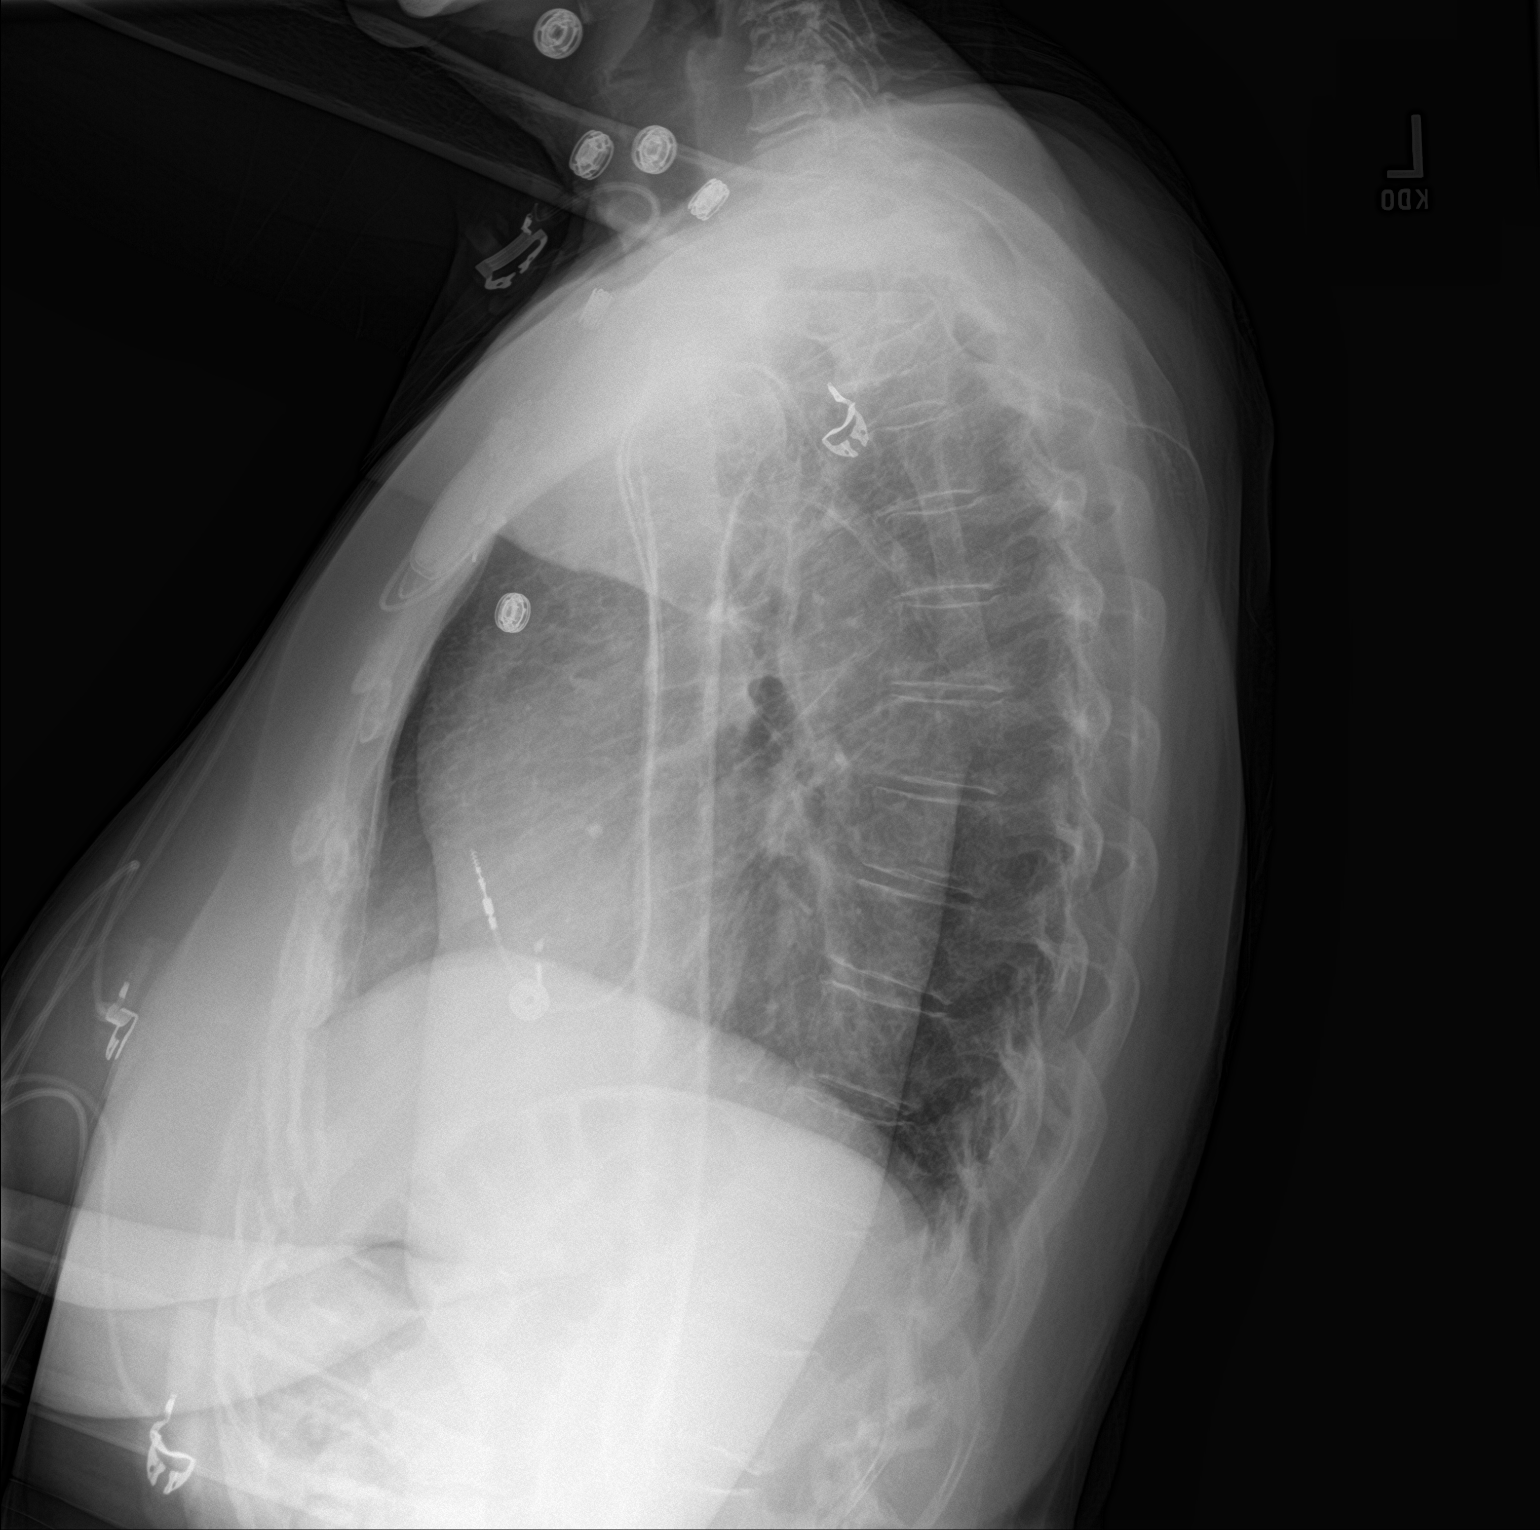

[2 of 2 positions shown; findings below may reference images not displayed]

FINDINGS: Dual lead pacemaker inserted from a left subclavian approach with
leads in region of the right atrium and right ventricle. No
pneumothorax. Heart size is normal. Mediastinal shadows are
otherwise normal. The lungs are clear. No effusions.
IMPRESSION: Dual lead pacemaker with leads in the region of the right atrium and
right ventricle. No active disease. No device complication evident.

## 2019-05-11 DEATH — deceased

## 2019-05-12 NOTE — Progress Notes (Signed)
Remote pacemaker transmission.   

## 2019-06-08 ENCOUNTER — Encounter: Payer: Medicare Other | Admitting: Internal Medicine

## 2019-06-16 ENCOUNTER — Telehealth: Payer: Medicare Other | Admitting: Family

## 2019-06-16 DIAGNOSIS — B9689 Other specified bacterial agents as the cause of diseases classified elsewhere: Secondary | ICD-10-CM | POA: Diagnosis not present

## 2019-06-16 DIAGNOSIS — J019 Acute sinusitis, unspecified: Secondary | ICD-10-CM

## 2019-06-16 MED ORDER — PREDNISONE 10 MG (21) PO TBPK: ORAL_TABLET | ORAL | 0 refills | Status: DC

## 2019-06-16 MED ORDER — AMOXICILLIN-POT CLAVULANATE 875-125 MG PO TABS: 1.0000 | ORAL_TABLET | Freq: Two times a day (BID) | ORAL | 0 refills | Status: DC

## 2019-06-16 NOTE — Progress Notes (Signed)
We are sorry that you are not feeling well.  Here is how we plan to help!  Based on what you have shared with me it looks like you have sinusitis.  Sinusitis is inflammation and infection in the sinus cavities of the head.  Based on your presentation I believe you most likely have Acute Bacterial Sinusitis.  This is an infection caused by bacteria and is treated with antibiotics. I have prescribed Augmentin 875mg /125mg  one tablet twice daily with food, for 7 days. I also used Prednisone. I sent in the prescription.  You may use an oral decongestant such as Mucinex D or if you have glaucoma or high blood pressure use plain Mucinex. Saline nasal spray help and can safely be used as often as needed for congestion.  If you develop worsening sinus pain, fever or notice severe headache and vision changes, or if symptoms are not better after completion of antibiotic, please schedule an appointment with a health care provider.    Sinus infections are not as easily transmitted as other respiratory infection, however we still recommend that you avoid close contact with loved ones, especially the very young and elderly.  Remember to wash your hands thoroughly throughout the day as this is the number one way to prevent the spread of infection!  Home Care:  Only take medications as instructed by your medical team.  Complete the entire course of an antibiotic.  Do not take these medications with alcohol.  A steam or ultrasonic humidifier can help congestion.  You can place a towel over your head and breathe in the steam from hot water coming from a faucet.  Avoid close contacts especially the very young and the elderly.  Cover your mouth when you cough or sneeze.  Always remember to wash your hands.  Get Help Right Away If:  You develop worsening fever or sinus pain.  You develop a severe head ache or visual changes.  Your symptoms persist after you have completed your treatment plan.  Make sure  you  Understand these instructions.  Will watch your condition.  Will get help right away if you are not doing well or get worse.  Your e-visit answers were reviewed by a board certified advanced clinical practitioner to complete your personal care plan.  Depending on the condition, your plan could have included both over the counter or prescription medications.  If there is a problem please reply  once you have received a response from your provider.  Your safety is important to [place].  If you have drug allergies check your prescription carefully.    You can use MyChart to ask questions about today's visit, request a non-urgent call back, or ask for a work or school excuse for 24 hours related to this e-Visit. If it has been greater than 24 hours you will need to follow up with your provider, or enter a new e-Visit to address those concerns.  You will get an e-mail in the next two days asking about your experience.  I hope that your e-visit has been valuable and will speed your recovery. Thank you for using e-visits.  Greater than 5 minutes, yet less than 10 minutes of time have been spent researching, coordinating, and implementing care for this patient today.  Thank you for the details you included in the comment boxes. Those details are very helpful in determining the best course of treatment for you and help [place] to provide the best care.

## 2019-06-23 ENCOUNTER — Ambulatory Visit (INDEPENDENT_AMBULATORY_CARE_PROVIDER_SITE_OTHER): Payer: Medicare Other | Admitting: Internal Medicine

## 2019-06-23 ENCOUNTER — Encounter: Payer: Self-pay | Admitting: Internal Medicine

## 2019-06-23 ENCOUNTER — Telehealth: Payer: Medicare Other | Admitting: Family

## 2019-06-23 DIAGNOSIS — Z9102 Food additives allergy status: Secondary | ICD-10-CM

## 2019-06-23 DIAGNOSIS — J0140 Acute pansinusitis, unspecified: Secondary | ICD-10-CM

## 2019-06-23 DIAGNOSIS — J3489 Other specified disorders of nose and nasal sinuses: Secondary | ICD-10-CM | POA: Diagnosis not present

## 2019-06-23 DIAGNOSIS — J329 Chronic sinusitis, unspecified: Secondary | ICD-10-CM

## 2019-06-23 MED ORDER — CEFUROXIME AXETIL 500 MG PO TABS: 500.0000 mg | ORAL_TABLET | Freq: Two times a day (BID) | ORAL | 1 refills | Status: DC

## 2019-06-23 MED ORDER — PREDNISONE 10 MG PO TABS: ORAL_TABLET | ORAL | 0 refills | Status: DC

## 2019-06-23 MED ORDER — MUPIROCIN 2 % EX OINT: TOPICAL_OINTMENT | CUTANEOUS | 0 refills | Status: DC

## 2019-06-23 NOTE — Progress Notes (Signed)
Virtual Visit via Video Note  I connected with Anna Ellis on 06/23/19 at 11:20 AM EDT by a video enabled telemedicine application and verified that I am speaking with the correct person using two identifiers.   I discussed the limitations of evaluation and management by telemedicine and the availability of in person appointments. The patient expressed understanding and agreed to proceed.  History of Present Illness: We need to follow-up on sinusitis issues - Augmentin, Medrol pac did not help. C/o a nasal sore F/u allergy to food dyes  There has been no cough, chest pain, shortness of breath, abdominal pain, diarrhea, constipation, arthralgias, skin rashes.   Observations/Objective: The patient appears to be in no acute distress, looks well.  Assessment and Plan:  See my Assessment and Plan. Follow Up Instructions:    I discussed the assessment and treatment plan with the patient. The patient was provided an opportunity to ask questions and all were answered. The patient agreed with the plan and demonstrated an understanding of the instructions.   The patient was advised to call back or seek an in-person evaluation if the symptoms worsen or if the condition fails to improve as anticipated.  I provided face-to-face time during this encounter. We were at different locations.   Walker Kehr, MD

## 2019-06-23 NOTE — Assessment & Plan Note (Signed)
Ceftin and prednisone taper: Prednisone 10 mg: take 4 tabs a day x 3 days; then 3 tabs a day x 4 days; then 2 tabs a day x 4 days, then 1 tab a day x 6 days, then stop. Take pc.

## 2019-06-23 NOTE — Assessment & Plan Note (Signed)
White tablets

## 2019-06-23 NOTE — Progress Notes (Signed)
Based on what you shared with me, I feel your condition warrants further evaluation and I recommend that you be seen for a face to face office visit.  NOTE: If you entered your credit card information for this eVisit, you will not be charged. You may see a "hold" on your card for the $35 but that hold will drop off and you will not have a charge processed.  If you are having a true medical emergency please call 911.     For an urgent face to face visit, [place] has five urgent care centers for your convenience:    [email or web address] to reserve your spot online an avoid wait times  [facility] [address], [address] [place], [place] [address] Modified hours of operation: Monday-Friday, 12 PM to 6 PM  Closed Saturday & Sunday  *Across the street from [facility] (New Address!) [address] [place], [place] [address] *Just off [address], across the road from [place] hours of operation: Monday-Friday, 12 PM to 6 PM  Closed Saturday & Sunday   The following sites will take your insurance:  . [facility]    [phone]                  Get Driving Directions  [address], [place] [address] . 10 am to 8 pm Monday-Friday . 12 pm to 8 pm Saturday-Sunday   . [facility] at [place]                  Get Driving Directions  [address], [place], [place] [address] . 8 am to 8 pm Monday-Friday . 9 am to 6 pm Saturday . 11 am to 6 pm Sunday   . [facility] at [place]                  Get Driving Directions   [address], [place] [address] . 8 am to 8 pm Monday-Friday . 8 am to 4 pm Saturday-Sunday    . [facility] at [place]                    Get Driving Directions  [phone]  [address] [place],  [address]   . Monday-Friday, 12 PM to 6 PM    Your e-visit answers were reviewed by a board certified advanced clinical practitioner to complete your personal care plan.  Thank you for using e-Visits.

## 2019-06-23 NOTE — Assessment & Plan Note (Signed)
Bactroban oint prn

## 2019-07-23 ENCOUNTER — Ambulatory Visit (INDEPENDENT_AMBULATORY_CARE_PROVIDER_SITE_OTHER): Payer: Medicare Other | Admitting: *Deleted

## 2019-07-23 DIAGNOSIS — I441 Atrioventricular block, second degree: Secondary | ICD-10-CM | POA: Diagnosis not present

## 2019-07-24 LAB — CUP PACEART REMOTE DEVICE CHECK
Battery Remaining Longevity: 134 mo
Battery Voltage: 3.03 V
Brady Statistic AP VP Percent: 27.8 %
Brady Statistic AP VS Percent: 0.83 %
Brady Statistic AS VP Percent: 58.01 %
Brady Statistic AS VS Percent: 13.36 %
Brady Statistic RA Percent Paced: 28.64 %
Brady Statistic RV Percent Paced: 85.81 %
Date Time Interrogation Session: 20200813230716
Implantable Lead Implant Date: 20190801
Implantable Lead Implant Date: 20190801
Implantable Lead Location: 753859
Implantable Lead Location: 753860
Implantable Lead Model: 3830
Implantable Lead Model: 5076
Implantable Pulse Generator Implant Date: 20190801
Lead Channel Impedance Value: 285 Ohm
Lead Channel Impedance Value: 323 Ohm
Lead Channel Impedance Value: 380 Ohm
Lead Channel Impedance Value: 380 Ohm
Lead Channel Pacing Threshold Amplitude: 0.625 V
Lead Channel Pacing Threshold Amplitude: 0.875 V
Lead Channel Pacing Threshold Pulse Width: 0.4 ms
Lead Channel Pacing Threshold Pulse Width: 0.4 ms
Lead Channel Sensing Intrinsic Amplitude: 12.875 mV
Lead Channel Sensing Intrinsic Amplitude: 12.875 mV
Lead Channel Sensing Intrinsic Amplitude: 4 mV
Lead Channel Sensing Intrinsic Amplitude: 4 mV
Lead Channel Setting Pacing Amplitude: 2 V
Lead Channel Setting Pacing Amplitude: 2.5 V
Lead Channel Setting Pacing Pulse Width: 0.4 ms
Lead Channel Setting Sensing Sensitivity: 1.2 mV

## 2019-07-25 ENCOUNTER — Other Ambulatory Visit: Payer: Self-pay | Admitting: Internal Medicine

## 2019-08-03 ENCOUNTER — Encounter: Payer: Self-pay | Admitting: Cardiology

## 2019-08-03 NOTE — Progress Notes (Signed)
Remote pacemaker transmission.   

## 2019-08-25 ENCOUNTER — Telehealth: Payer: Self-pay | Admitting: Internal Medicine

## 2019-08-25 NOTE — Telephone Encounter (Signed)
Patient Declined AWV at this time. SF

## 2019-10-22 ENCOUNTER — Ambulatory Visit (INDEPENDENT_AMBULATORY_CARE_PROVIDER_SITE_OTHER): Payer: Medicare Other | Admitting: *Deleted

## 2019-10-22 ENCOUNTER — Encounter: Payer: Medicare Other | Admitting: Internal Medicine

## 2019-10-22 DIAGNOSIS — R001 Bradycardia, unspecified: Secondary | ICD-10-CM

## 2019-10-22 DIAGNOSIS — I441 Atrioventricular block, second degree: Secondary | ICD-10-CM | POA: Diagnosis not present

## 2019-10-24 LAB — CUP PACEART REMOTE DEVICE CHECK
Battery Remaining Longevity: 128 mo
Battery Voltage: 3.02 V
Brady Statistic AP VP Percent: 25.31 %
Brady Statistic AP VS Percent: 0 %
Brady Statistic AS VP Percent: 74.54 %
Brady Statistic AS VS Percent: 0.15 %
Brady Statistic RA Percent Paced: 25.32 %
Brady Statistic RV Percent Paced: 99.85 %
Date Time Interrogation Session: 20201112235840
Implantable Lead Implant Date: 20190801
Implantable Lead Implant Date: 20190801
Implantable Lead Location: 753859
Implantable Lead Location: 753860
Implantable Lead Model: 3830
Implantable Lead Model: 5076
Implantable Pulse Generator Implant Date: 20190801
Lead Channel Impedance Value: 323 Ohm
Lead Channel Impedance Value: 323 Ohm
Lead Channel Impedance Value: 399 Ohm
Lead Channel Impedance Value: 399 Ohm
Lead Channel Pacing Threshold Amplitude: 0.625 V
Lead Channel Pacing Threshold Amplitude: 0.75 V
Lead Channel Pacing Threshold Pulse Width: 0.4 ms
Lead Channel Pacing Threshold Pulse Width: 0.4 ms
Lead Channel Sensing Intrinsic Amplitude: 12.875 mV
Lead Channel Sensing Intrinsic Amplitude: 12.875 mV
Lead Channel Sensing Intrinsic Amplitude: 4.625 mV
Lead Channel Sensing Intrinsic Amplitude: 4.625 mV
Lead Channel Setting Pacing Amplitude: 2 V
Lead Channel Setting Pacing Amplitude: 2.5 V
Lead Channel Setting Pacing Pulse Width: 0.4 ms
Lead Channel Setting Sensing Sensitivity: 1.2 mV

## 2019-10-28 NOTE — Progress Notes (Signed)
Cardiology Office Note Date:  10/30/2019  Patient ID:  Anna Ellis, Anna Ellis January 20, 1951, MRN [id number] PCP:  Cassandria Anger, MD  Cardiologist:  Dr. Ellyn Hack Electrophysiologist: Dr. Rayann Heman     Chief Complaint: annual visit  History of Present Illness: Anna Ellis is a 68 y.o. female with history of anxiety, GERD, HLD, OA, advanced heart block w/PPM.  She comes in today to be seen for Dr. Rayann Heman, last seen by him Nov 2019, at her 3 mo/post implant visit.  Shje was doing well.  Noted high BP, the pt reported MD visits only, infrequent higher readings at home only, not felt to warrant medical tx at that time.  She is doing well.  No CP or SOB, no DOE.  She denies any difficulties with her ADLs, no formal exercise but is caring for her 2y/o grandchild who keeps her moving.  No dizzy spells, near syncopeor syncope.  She is occasionally aware of her heart beat fast, gives an example of when cleaning her shower door.  She reports home BP 120's/80 range.  She sess her PMD next week for her annual visit/labs..   Device information MDT dual chamber PPM, (HIS), implanted 07/10/18   Past Medical History:  Diagnosis Date  . ALLERGIC RHINITIS 09/19/2007  . ANEMIA-IRON DEFICIENCY 09/19/2007  . ANXIETY 09/19/2007  . Arthritis    "neck" (07/10/2018)  . Basal cell cancer    "back of my neck; scraped it off"  . CAROTID BRUIT 01/08/2011  . GERD (gastroesophageal reflux disease)   . Hyperlipidemia   . Mobitz II    a. s/p MDT dual chamber (His Bundle) pacemaker 07/2018 - Dr Rayann Heman  . OSTEOARTHRITIS 09/19/2007    Past Surgical History:  Procedure Laterality Date  . ANTERIOR CRUCIATE LIGAMENT REPAIR Left 2001   "donor ACL"  . BREAST LUMPECTOMY Bilateral 2002   "both benign"  . INGUINAL HERNIA REPAIR Right 1971  . KNEE CARTILAGE SURGERY Left 1986   w/ACL repair  . LAPAROSCOPIC CHOLECYSTECTOMY  1994  . LEFT HEART CATH AND CORONARY ANGIOGRAPHY N/A 07/08/2018   Procedure: LEFT HEART  CATH AND CORONARY ANGIOGRAPHY;  Surgeon: Leonie Man, MD;  Location: [facility];  Service: Cardiovascular;; Angiographically normal coronary arteries except ~20-25% prox & mid LAD.  Normal LVEDP.  Marland Kitchen PACEMAKER IMPLANT N/A 07/10/2018   Medtronic Azure XT DR MRI SureScan model [id number] (serial number [id number]) pacemaker by Dr Rayann Heman with His bundle lead  . [facility]  . TONSILLECTOMY AND ADENOIDECTOMY  1968  . TUBAL LIGATION  1982  . VAGINAL HYSTERECTOMY  1990  . WISDOM TOOTH EXTRACTION  1967    Current Outpatient Medications  Medication Sig Dispense Refill  . acetaminophen (TYLENOL) 500 MG tablet Take 1,000 mg by mouth every 6 (six) hours as needed (for pain.).    Marland Kitchen ALPRAZolam (XANAX) 0.25 MG tablet Take 1 tablet (0.25 mg total) by mouth 2 (two) times daily as needed. 30 tablet 1  . aspirin EC 81 MG tablet Take 81 mg by mouth daily.    Marland Kitchen aspirin-acetaminophen-caffeine (EXCEDRIN MIGRAINE) 250-250-65 MG tablet Take 2 tablets by mouth every 6 (six) hours as needed for headache.    Marland Kitchen ELDERBERRY PO Take by mouth daily.    Marland Kitchen atorvastatin (LIPITOR) 20 MG tablet Take 10 mg by mouth daily. 1/2 tablet(10mg ) daily. Insurance will only pay for 20mg  tablet.    . Cholecalciferol (VITAMIN D3) 2000 units TABS Take 2,000 Units by mouth daily.     Marland Kitchen  etodolac (LODINE) 500 MG tablet TAKE 1 TABLET TWICE A DAY AS NEEDED WITH FOOD 180 tablet 3  . metroNIDAZOLE (METROCREAM) 0.75 % cream Apply 1 application topically daily as needed (for rosacea).    . mometasone (NASONEX) 50 MCG/ACT nasal spray Place 2 sprays into the nose daily. (Patient taking differently: Place 2 sprays into the nose as needed. ) 17 g 3  . montelukast (SINGULAIR) 10 MG tablet Take 1 tablet (10 mg total) by mouth daily. 90 tablet 3  . mupirocin ointment (BACTROBAN) 2 % Use bid 30 g 0  . omeprazole (PRILOSEC) 20 MG capsule Take 1 capsule (20 mg total) by mouth 2 (two) times daily before a meal. White caps please 60 capsule 11    No current facility-administered medications for this visit.     Allergies:   Clarithromycin, Food color red, Sulfonamide derivatives, Cobalt, and Parabens   Social History:  The patient  reports that she has never smoked. She has never used smokeless tobacco. She reports that she does not drink alcohol or use drugs.   Family History:  The patient's family history includes Allergies in her son; Asthma in her son; Breast cancer in her mother; Heart disease in her father; Pneumonia in her paternal grandmother; Throat cancer in her maternal grandfather; Tuberculosis in her maternal grandmother.  ROS:  Please see the history of present illness.  All other systems are reviewed and otherwise negative.   PHYSICAL EXAM:  VS:  BP 140/84   Pulse 65   Ht 5\' 4"  (1.626 m)   Wt 173 lb (78.5 kg)   BMI 29.70 kg/m  BMI: Body mass index is 29.7 kg/m. Well nourished, well developed, in no acute distress  HEENT: normocephalic, atraumatic  Neck: no JVD, carotid bruits or masses Cardiac:  RRR; no significant murmurs, no rubs, or gallops Lungs:  CTA b/l, no wheezing, rhonchi or rales  Abd: soft, nontender MS: no deformity or atrophy Ext: no edema  Skin: warm and dry, no rash Neuro:  No gross deficits appreciated Psych: euthymic mood, full affect  PPM site is stable, no tethering or discomfort   EKG:  Done today and reviewed by myself shows AS/VP PPM interrogation done today and reviewed by myself;  Battery and lead measurements are good 2 NSVT episode, 4 and 5 beat NSVT 59 AT/AF episodes EGMs available for review One an AT, others some FF on A channel, some appear to be PACs No ERGMS available for all, some only marker channels No programming changes were made given not all appear to be [place]  I did not see any true AFib    Myoview 07/04/2018: EF 55-65%.  Medium sized moderate severity anterior defect concerning for ischemia.  INTERMEDIATE RISK.  No RWMA -- referred for Cath (FALSE  POSITIVE - Breast Attenuation vs. Septal bounce)  Holter Monitor 07/07/2018: Itermittent 2;1 heart block with narrow complex escape rhythm rate in the 40s.  Rare PVCs, junctional escape beats and rare ventricular escape  LCP-Cors 07/08/2018: Angiographically normal coronary arteries except ~20-25% prox & mid LAD.  Normal LVEDP.  TTE 07/10/2018: EF 55-60%. No RWMA.  Mild MR.   Recent Labs: No results found for requested labs within last 8760 hours.  No results found for requested labs within last 8760 hours.   CrCl cannot be calculated (Patient's most recent lab result is older than the maximum 21 days allowed.).   Wt Readings from Last 3 Encounters:  10/30/19 173 lb (78.5 kg)  02/20/19 174 lb 0.6  oz (78.9 kg)  10/15/18 174 lb 9.6 oz (79.2 kg)     Other studies reviewed: Additional studies/records reviewed today include: summarized above  ASSESSMENT AND PLAN:  1. PPM     Intact function, no programming changes made  2. HLD     She sees her PMD next week for labs/annual visit    Disposition: F/u withQ 3 mo remotes, in-clinic in 1 year, sooner if needed    Current medicines are reviewed at length with the patient today.  The patient did not have any concerns regarding medicines.  Venetia Night, PA-C 10/30/2019 10:11 AM     [facility] [address] [place] [phone] (office)  [phone] (fax)

## 2019-10-30 ENCOUNTER — Other Ambulatory Visit: Payer: Self-pay

## 2019-10-30 ENCOUNTER — Ambulatory Visit (INDEPENDENT_AMBULATORY_CARE_PROVIDER_SITE_OTHER): Payer: Medicare Other | Admitting: Physician Assistant

## 2019-10-30 VITALS — BP 140/84 | HR 65 | Ht 64.0 in | Wt 173.0 lb

## 2019-10-30 DIAGNOSIS — I441 Atrioventricular block, second degree: Secondary | ICD-10-CM | POA: Diagnosis not present

## 2019-10-30 DIAGNOSIS — Z950 Presence of cardiac pacemaker: Secondary | ICD-10-CM

## 2019-10-30 NOTE — Patient Instructions (Signed)
Medication Instructions:  Your physician recommends that you continue on your current medications as directed. Please refer to the Current Medication list given to you today.  *If you need a refill on your cardiac medications before your next appointment, please call your pharmacy*  Lab Work: [place]   If you have labs (blood work) drawn today and your tests are completely normal, you will receive your results only by: Marland Kitchen MyChart Message (if you have MyChart) OR . A paper copy in the mail If you have any lab test that is abnormal or we need to change your treatment, we will call you to review the results.  Testing/Procedures: NONE ORDERED  TODAY   Follow-Up: At [facility], you and your health needs are our priority.  As part of our continuing mission to provide you with exceptional heart care, we have created designated Provider Care Teams.  These Care Teams include your primary Cardiologist (physician) and Advanced Practice Providers (APPs -  Physician Assistants and Nurse Practitioners) who all work together to provide you with the care you need, when you need it.  Your next appointment:   1 month(s)  The format for your next appointment:   In Person  Provider:   You may see Dr. Rayann Heman or one of the following Advanced Practice Providers on your designated Care Team:    Chanetta Marshall, NP  Tommye Standard, PA-C  Legrand Como "Oda Kilts, [place]   Other Instructions

## 2019-11-04 ENCOUNTER — Encounter: Payer: Self-pay | Admitting: Internal Medicine

## 2019-11-04 ENCOUNTER — Ambulatory Visit (INDEPENDENT_AMBULATORY_CARE_PROVIDER_SITE_OTHER): Payer: Medicare Other | Admitting: Internal Medicine

## 2019-11-04 ENCOUNTER — Other Ambulatory Visit: Payer: Self-pay

## 2019-11-04 ENCOUNTER — Other Ambulatory Visit (INDEPENDENT_AMBULATORY_CARE_PROVIDER_SITE_OTHER): Payer: Medicare Other

## 2019-11-04 VITALS — BP 142/88 | HR 79 | Temp 98.0°F | Ht 64.0 in | Wt 173.0 lb

## 2019-11-04 DIAGNOSIS — L509 Urticaria, unspecified: Secondary | ICD-10-CM

## 2019-11-04 DIAGNOSIS — Z0000 Encounter for general adult medical examination without abnormal findings: Secondary | ICD-10-CM

## 2019-11-04 DIAGNOSIS — J0101 Acute recurrent maxillary sinusitis: Secondary | ICD-10-CM | POA: Diagnosis not present

## 2019-11-04 DIAGNOSIS — J019 Acute sinusitis, unspecified: Secondary | ICD-10-CM

## 2019-11-04 DIAGNOSIS — R001 Bradycardia, unspecified: Secondary | ICD-10-CM | POA: Diagnosis not present

## 2019-11-04 DIAGNOSIS — I441 Atrioventricular block, second degree: Secondary | ICD-10-CM

## 2019-11-04 DIAGNOSIS — J3489 Other specified disorders of nose and nasal sinuses: Secondary | ICD-10-CM | POA: Diagnosis not present

## 2019-11-04 DIAGNOSIS — F419 Anxiety disorder, unspecified: Secondary | ICD-10-CM

## 2019-11-04 LAB — URINALYSIS, ROUTINE W REFLEX MICROSCOPIC
Bilirubin Urine: NEGATIVE
Ketones, ur: NEGATIVE
Leukocytes,Ua: NEGATIVE
Nitrite: NEGATIVE
Specific Gravity, Urine: 1.02 (ref 1.000–1.030)
Total Protein, Urine: NEGATIVE
Urine Glucose: NEGATIVE
Urobilinogen, UA: 0.2 (ref 0.0–1.0)
pH: 6 (ref 5.0–8.0)

## 2019-11-04 LAB — LIPID PANEL
Cholesterol: 163 mg/dL (ref 0–200)
HDL: 56 mg/dL (ref >39.00)
LDL Cholesterol: 88 mg/dL (ref 0–99)
NonHDL: 106.53
Total CHOL/HDL Ratio: 3
Triglycerides: 92 mg/dL (ref 0.0–149.0)
VLDL: 18.4 mg/dL (ref 0.0–40.0)

## 2019-11-04 LAB — HEPATIC FUNCTION PANEL
ALT: 13 U/L (ref 0–35)
AST: 16 U/L (ref 0–37)
Albumin: 4.3 g/dL (ref 3.5–5.2)
Alkaline Phosphatase: 69 U/L (ref 39–117)
Bilirubin, Direct: 0.2 mg/dL (ref 0.0–0.3)
Total Bilirubin: 0.7 mg/dL (ref 0.2–1.2)
Total Protein: 7 g/dL (ref 6.0–8.3)

## 2019-11-04 LAB — BASIC METABOLIC PANEL
BUN: 18 mg/dL (ref 6–23)
CO2: 27 mEq/L (ref 19–32)
Calcium: 9.7 mg/dL (ref 8.4–10.5)
Chloride: 104 mEq/L (ref 96–112)
Creatinine, Ser: 0.71 mg/dL (ref 0.40–1.20)
GFR: 81.3 mL/min (ref >60.00)
Glucose, Bld: 94 mg/dL (ref 70–99)
Potassium: 4.1 mEq/L (ref 3.5–5.1)
Sodium: 139 mEq/L (ref 135–145)

## 2019-11-04 LAB — CBC WITH DIFFERENTIAL/PLATELET
Basophils Absolute: 0.1 10*3/uL (ref 0.0–0.1)
Basophils Relative: 1.3 % (ref 0.0–3.0)
Eosinophils Absolute: 0.2 10*3/uL (ref 0.0–0.7)
Eosinophils Relative: 3.8 % (ref 0.0–5.0)
HCT: 43.8 % (ref 36.0–46.0)
Hemoglobin: 14.9 g/dL (ref 12.0–15.0)
Lymphocytes Relative: 29.4 % (ref 12.0–46.0)
Lymphs Abs: 1.4 10*3/uL (ref 0.7–4.0)
MCHC: 34.1 g/dL (ref 30.0–36.0)
MCV: 93.9 fl (ref 78.0–100.0)
Monocytes Absolute: 0.5 10*3/uL (ref 0.1–1.0)
Monocytes Relative: 11.2 % (ref 3.0–12.0)
Neutro Abs: 2.5 10*3/uL (ref 1.4–7.7)
Neutrophils Relative %: 54.3 % (ref 43.0–77.0)
Platelets: 341 10*3/uL (ref 150.0–400.0)
RBC: 4.66 Mil/uL (ref 3.87–5.11)
RDW: 13.3 % (ref 11.5–15.5)
WBC: 4.6 10*3/uL (ref 4.0–10.5)

## 2019-11-04 LAB — TSH: TSH: 2.25 u[IU]/mL (ref 0.35–4.50)

## 2019-11-04 MED ORDER — MUPIROCIN 2 % EX OINT: TOPICAL_OINTMENT | CUTANEOUS | 1 refills | Status: DC

## 2019-11-04 MED ORDER — OMEPRAZOLE 20 MG PO CPDR: 20.0000 mg | DELAYED_RELEASE_CAPSULE | Freq: Two times a day (BID) | ORAL | 11 refills | Status: DC

## 2019-11-04 MED ORDER — ONDANSETRON HCL 4 MG PO TABS: 4.0000 mg | ORAL_TABLET | Freq: Three times a day (TID) | ORAL | 0 refills | Status: DC | PRN

## 2019-11-04 MED ORDER — METRONIDAZOLE 0.75 % EX CREA: 1.0000 "application " | TOPICAL_CREAM | Freq: Every day | CUTANEOUS | 3 refills | Status: AC | PRN

## 2019-11-04 MED ORDER — ATORVASTATIN CALCIUM 20 MG PO TABS: 10.0000 mg | ORAL_TABLET | Freq: Every day | ORAL | 3 refills | Status: DC

## 2019-11-04 MED ORDER — ETODOLAC 500 MG PO TABS: ORAL_TABLET | ORAL | 3 refills | Status: DC

## 2019-11-04 MED ORDER — MOMETASONE FUROATE 50 MCG/ACT NA SUSP: 2.0000 | Freq: Every day | NASAL | 3 refills | Status: DC

## 2019-11-04 MED ORDER — ALPRAZOLAM 0.25 MG PO TABS: 0.2500 mg | ORAL_TABLET | Freq: Two times a day (BID) | ORAL | 1 refills | Status: DC | PRN

## 2019-11-04 MED ORDER — MONTELUKAST SODIUM 10 MG PO TABS: 10.0000 mg | ORAL_TABLET | Freq: Every day | ORAL | 3 refills | Status: DC

## 2019-11-04 MED ORDER — CEFUROXIME AXETIL 250 MG PO TABS: 500.0000 mg | ORAL_TABLET | Freq: Two times a day (BID) | ORAL | 1 refills | Status: AC

## 2019-11-04 NOTE — Assessment & Plan Note (Signed)
Pacemaker in place

## 2019-11-04 NOTE — Assessment & Plan Note (Signed)
singulair  

## 2019-11-04 NOTE — Assessment & Plan Note (Signed)
Xanax prn 

## 2019-11-04 NOTE — Assessment & Plan Note (Signed)
Bactroban prn

## 2019-11-04 NOTE — Progress Notes (Signed)
Subjective:  Patient ID: Anna Ellis, female    DOB: 1951-05-26  Age: 68 y.o. MRN: [id number]  CC: No chief complaint on file.   HPI Anna Ellis presents for a well exam Pt has a pacemaker now F/u insomnia, urticaria, allergies  Outpatient Medications Prior to Visit  Medication Sig Dispense Refill  . acetaminophen (TYLENOL) 500 MG tablet Take 1,000 mg by mouth every 6 (six) hours as needed (for pain.).    Marland Kitchen ALPRAZolam (XANAX) 0.25 MG tablet Take 1 tablet (0.25 mg total) by mouth 2 (two) times daily as needed. 30 tablet 1  . aspirin EC 81 MG tablet Take 81 mg by mouth daily.    Marland Kitchen aspirin-acetaminophen-caffeine (EXCEDRIN MIGRAINE) 250-250-65 MG tablet Take 2 tablets by mouth every 6 (six) hours as needed for headache.    Marland Kitchen atorvastatin (LIPITOR) 20 MG tablet Take 10 mg by mouth daily. 1/2 tablet(10mg ) daily. Insurance will only pay for 20mg  tablet.    . Cholecalciferol (VITAMIN D3) 2000 units TABS Take 2,000 Units by mouth daily.     Marland Kitchen ELDERBERRY PO Take by mouth daily.    Marland Kitchen etodolac (LODINE) 500 MG tablet TAKE 1 TABLET TWICE A DAY AS NEEDED WITH FOOD 180 tablet 3  . metroNIDAZOLE (METROCREAM) 0.75 % cream Apply 1 application topically daily as needed (for rosacea).    . mometasone (NASONEX) 50 MCG/ACT nasal spray Place 2 sprays into the nose daily. (Patient taking differently: Place 2 sprays into the nose as needed. ) 17 g 3  . mupirocin ointment (BACTROBAN) 2 % Use bid 30 g 0  . omeprazole (PRILOSEC) 20 MG capsule Take 1 capsule (20 mg total) by mouth 2 (two) times daily before a meal. White caps please 60 capsule 11  . montelukast (SINGULAIR) 10 MG tablet Take 1 tablet (10 mg total) by mouth daily. 90 tablet 3   No facility-administered medications prior to visit.     ROS: Review of Systems  Constitutional: Negative for activity change, appetite change, chills, fatigue and unexpected weight change.  HENT: Negative for congestion, mouth sores and sinus pressure.   Eyes:  Negative for visual disturbance.  Respiratory: Negative for cough and chest tightness.   Gastrointestinal: Negative for abdominal pain and nausea.  Genitourinary: Negative for difficulty urinating, frequency and vaginal pain.  Musculoskeletal: Negative for back pain and gait problem.  Skin: Negative for pallor and rash.  Neurological: Negative for dizziness, tremors, weakness, numbness and headaches.  Psychiatric/Behavioral: Negative for confusion, sleep disturbance and suicidal ideas.    Objective:  BP (!) 142/88 (BP Location: Left Arm, Patient Position: Sitting, Cuff Size: Normal)   Pulse 79   Temp 98 F (36.7 C) (Oral)   Ht 5\' 4"  (1.626 m)   Wt 173 lb (78.5 kg)   SpO2 96%   BMI 29.70 kg/m   BP Readings from Last 3 Encounters:  11/04/19 (!) 142/88  10/30/19 140/84  02/20/19 130/70    Wt Readings from Last 3 Encounters:  11/04/19 173 lb (78.5 kg)  10/30/19 173 lb (78.5 kg)  02/20/19 174 lb 0.6 oz (78.9 kg)    Physical Exam Constitutional:      General: She is not in acute distress.    Appearance: She is well-developed.  HENT:     Head: Normocephalic.     Right Ear: External ear normal.     Left Ear: External ear normal.     Nose: Nose normal.  Eyes:     General:  Right eye: No discharge.        Left eye: No discharge.     Conjunctiva/sclera: Conjunctivae normal.     Pupils: Pupils are equal, round, and reactive to light.  Neck:     Musculoskeletal: Normal range of motion and neck supple.     Thyroid: No thyromegaly.     Vascular: No JVD.     Trachea: No tracheal deviation.  Cardiovascular:     Rate and Rhythm: Normal rate and regular rhythm.     Heart sounds: Normal heart sounds.  Pulmonary:     Effort: No respiratory distress.     Breath sounds: No stridor. No wheezing.  Abdominal:     General: Bowel sounds are normal. There is no distension.     Palpations: Abdomen is soft. There is no mass.     Tenderness: There is no abdominal tenderness. There  is no guarding or rebound.  Musculoskeletal:        General: No tenderness.  Lymphadenopathy:     Cervical: No cervical adenopathy.  Skin:    Findings: No erythema or rash.  Neurological:     Mental Status: She is oriented to person, place, and time.     Cranial Nerves: No cranial nerve deficit.     Motor: No abnormal muscle tone.     Coordination: Coordination normal.     Deep Tendon Reflexes: Reflexes normal.  Psychiatric:        Behavior: Behavior normal.        Thought Content: Thought content normal.        Judgment: Judgment normal.   pacemaker L chest  Lab Results  Component Value Date   WBC 7.8 07/01/2018   HGB 14.1 07/01/2018   HCT 41.3 07/01/2018   PLT 374.0 07/01/2018   GLUCOSE 95 07/11/2018   CHOL 167 05/01/2018   TRIG 93.0 05/01/2018   HDL 59.90 05/01/2018   LDLCALC 88 05/01/2018   ALT 16 07/01/2018   AST 13 07/01/2018   NA 142 07/11/2018   K 3.9 07/11/2018   CL 111 07/11/2018   CREATININE 0.73 07/11/2018   BUN 16 07/11/2018   CO2 22 07/11/2018   TSH 2.22 07/01/2018    Dg Chest 2 View  Result Date: 07/11/2018 CLINICAL DATA:  Implanted cardiac device EXAM: CHEST - 2 VIEW COMPARISON:  01/23/2010 FINDINGS: Dual lead pacemaker inserted from a left subclavian approach with leads in region of the right atrium and right ventricle. No pneumothorax. Heart size is normal. Mediastinal shadows are otherwise normal. The lungs are clear. No effusions. IMPRESSION: Dual lead pacemaker with leads in the region of the right atrium and right ventricle. No active disease. No device complication evident. Electronically Signed   By: Nelson Chimes M.D.   On: 07/11/2018 08:00    Assessment & Plan:   There are no diagnoses linked to this encounter.   No orders of the defined types were placed in this encounter.    Follow-up: No follow-ups on file.  Walker Kehr, MD

## 2019-11-04 NOTE — Assessment & Plan Note (Signed)
We discussed age appropriate health related issues, including available/recomended screening tests and vaccinations. We discussed a need for adhering to healthy diet and exercise. Labs were ordered to be later reviewed . All questions were answered.   

## 2019-11-13 NOTE — Progress Notes (Signed)
Remote pacemaker transmission.   

## 2019-11-28 ENCOUNTER — Other Ambulatory Visit: Payer: Self-pay | Admitting: Internal Medicine

## 2019-11-28 MED ORDER — METHYLPREDNISOLONE 4 MG PO TBPK: ORAL_TABLET | ORAL | 0 refills | Status: DC

## 2020-01-21 ENCOUNTER — Ambulatory Visit (INDEPENDENT_AMBULATORY_CARE_PROVIDER_SITE_OTHER): Payer: Medicare Other | Admitting: *Deleted

## 2020-01-21 DIAGNOSIS — I441 Atrioventricular block, second degree: Secondary | ICD-10-CM | POA: Diagnosis not present

## 2020-01-21 LAB — CUP PACEART REMOTE DEVICE CHECK
Battery Remaining Longevity: 125 mo
Battery Voltage: 3.01 V
Brady Statistic AP VP Percent: 35.06 %
Brady Statistic AP VS Percent: 0.48 %
Brady Statistic AS VP Percent: 59.44 %
Brady Statistic AS VS Percent: 5.02 %
Brady Statistic RA Percent Paced: 35.55 %
Brady Statistic RV Percent Paced: 94.49 %
Date Time Interrogation Session: 20210210201703
Implantable Lead Implant Date: 20190801
Implantable Lead Implant Date: 20190801
Implantable Lead Location: 753859
Implantable Lead Location: 753860
Implantable Lead Model: 3830
Implantable Lead Model: 5076
Implantable Pulse Generator Implant Date: 20190801
Lead Channel Impedance Value: 342 Ohm
Lead Channel Impedance Value: 342 Ohm
Lead Channel Impedance Value: 418 Ohm
Lead Channel Impedance Value: 437 Ohm
Lead Channel Pacing Threshold Amplitude: 0.625 V
Lead Channel Pacing Threshold Amplitude: 0.75 V
Lead Channel Pacing Threshold Pulse Width: 0.4 ms
Lead Channel Pacing Threshold Pulse Width: 0.4 ms
Lead Channel Sensing Intrinsic Amplitude: 4.375 mV
Lead Channel Sensing Intrinsic Amplitude: 4.375 mV
Lead Channel Sensing Intrinsic Amplitude: 4.5 mV
Lead Channel Sensing Intrinsic Amplitude: 4.5 mV
Lead Channel Setting Pacing Amplitude: 2 V
Lead Channel Setting Pacing Amplitude: 2.5 V
Lead Channel Setting Pacing Pulse Width: 0.4 ms
Lead Channel Setting Sensing Sensitivity: 1.2 mV

## 2020-01-22 NOTE — Progress Notes (Signed)
PPM Remote  

## 2020-02-08 ENCOUNTER — Other Ambulatory Visit: Payer: Self-pay

## 2020-02-08 ENCOUNTER — Ambulatory Visit (INDEPENDENT_AMBULATORY_CARE_PROVIDER_SITE_OTHER): Payer: Medicare Other | Admitting: Family

## 2020-02-08 ENCOUNTER — Encounter: Payer: Self-pay | Admitting: Family

## 2020-02-08 VITALS — BP 148/84 | HR 78 | Temp 98.3°F | Ht 64.0 in | Wt 174.8 lb

## 2020-02-08 DIAGNOSIS — J019 Acute sinusitis, unspecified: Secondary | ICD-10-CM

## 2020-02-08 DIAGNOSIS — R030 Elevated blood-pressure reading, without diagnosis of hypertension: Secondary | ICD-10-CM | POA: Diagnosis not present

## 2020-02-08 MED ORDER — PREDNISONE 10 MG PO TABS: ORAL_TABLET | ORAL | 0 refills | Status: AC

## 2020-02-08 MED ORDER — CEFUROXIME AXETIL 500 MG PO TABS: 500.0000 mg | ORAL_TABLET | Freq: Two times a day (BID) | ORAL | 0 refills | Status: DC

## 2020-02-08 NOTE — Progress Notes (Signed)
Anna Ellis is a 69 y.o. female with the following history as recorded in Epic[employer]:  Patient Active Problem List   Diagnosis Date Noted  . Allergy to food dye 06/23/2019  . Sore in nose 06/23/2019  . Second degree Mobitz II AV block 07/10/2018  . Abnormal nuclear stress test 07/04/2018  . 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 Symptomatic bradycardia 07/02/2018  . Chest tightness 07/01/2018  . 2nd degree AV block 07/01/2018  . Tongue discoloration 11/19/2017  . Sore throat 09/03/2016  . 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 Mouth ulcer 02/24/2013  . Pain in right eye 10/30/2012  . Well adult exam 01/14/2012  . Serum potassium elevated 01/14/2012  . Cystitis 01/14/2012  . Cerumen impaction 11/19/2011  . Nausea 09/11/2011  . Fibrocystic breast disease 08/06/2011  . Family history of breast cancer 08/06/2011  . GERD (gastroesophageal reflux disease) 07/11/2011  . Asthma with hay fever 06/15/2011  . CAROTID BRUIT 01/08/2011  . Headache 07/24/2010  . Nonspecific (abnormal) findings on radiological and other examination of body structure 01/25/2010  . ABNORMAL CHEST XRAY 01/25/2010  . SHOULDER PAIN 01/06/2010  . URTICARIA 12/27/2008  . Allergic urticaria 12/15/2008  . Asthma, cough variant 12/15/2008  . ELEVATED BLOOD PRESSURE WITHOUT DIAGNOSIS OF HYPERTENSION 12/15/2008  . Sinusitis 11/19/2007  . HYPERLIPIDEMIA 09/19/2007  . ANEMIA-IRON DEFICIENCY 09/19/2007  . Anxiety disorder 09/19/2007  . Allergic rhinitis 09/19/2007  . OSTEOARTHRITIS 09/19/2007  . RASH-NONVESICULAR 09/19/2007    Current Outpatient Medications  Medication Sig Dispense Refill  . acetaminophen (TYLENOL) 500 MG tablet Take 1,000 mg by mouth every 6 (six) hours as needed (for pain.).    Marland Kitchen ALPRAZolam (XANAX) 0.25 MG tablet Take 1 tablet (0.25 mg total) by mouth 2 (two) times daily as needed. 60 tablet 1  . aspirin EC 81 MG tablet Take 81 mg by mouth daily.    Marland Kitchen  aspirin-acetaminophen-caffeine (EXCEDRIN MIGRAINE) 250-250-65 MG tablet Take 2 tablets by mouth every 6 (six) hours as needed for headache.    Marland Kitchen atorvastatin (LIPITOR) 20 MG tablet Take 0.5 tablets (10 mg total) by mouth daily. 1/2 tablet(10mg ) daily. Insurance will only pay for 20mg  tablet. 90 tablet 3  . Cholecalciferol (VITAMIN D3) 2000 units TABS Take 2,000 Units by mouth daily.     Marland Kitchen ELDERBERRY PO Take by mouth daily.    Marland Kitchen etodolac (LODINE) 500 MG tablet TAKE 1 TABLET TWICE A DAY AS NEEDED WITH FOOD 180 tablet 3  . methylPREDNISolone (MEDROL DOSEPAK) 4 MG TBPK tablet As directed 21 tablet 0  . metroNIDAZOLE (METROCREAM) 0.75 % cream Apply 1 application topically daily as needed (for rosacea). 45 g 3  . mometasone (NASONEX) 50 MCG/ACT nasal spray Place 2 sprays into the nose daily. 17 g 3  . montelukast (SINGULAIR) 10 MG tablet Take 1 tablet (10 mg total) by mouth daily. 90 tablet 3  . mupirocin ointment (BACTROBAN) 2 % Use bid 30 g 1  . omeprazole (PRILOSEC) 20 MG capsule Take 1 capsule (20 mg total) by mouth 2 (two) times daily before a meal. White caps please 60 capsule 11  . ondansetron (ZOFRAN) 4 MG tablet Take 1 tablet (4 mg total) by mouth every 8 (eight) hours as needed for nausea or vomiting. 20 tablet 0  . cefUROXime (CEFTIN) 500 MG tablet Take 1 tablet (500 mg total) by mouth 2 (two) times daily with a meal. 20 tablet 0  . predniSONE (DELTASONE) 10 MG tablet Prednisone 10 mg:  take 4 tabs a day x 3 days; then 3 tabs a day x 4 days; then 2 tabs a day x 4 days, then 1 tab a day x 6 days, then stop. Take pc. 38 tablet 0   No current facility-administered medications for this visit.    Allergies: Clarithromycin, Food color red, Sulfonamide derivatives, Cobalt, and Parabens  Past Medical History:  Diagnosis Date  . ALLERGIC RHINITIS 09/19/2007  . ANEMIA-IRON DEFICIENCY 09/19/2007  . ANXIETY 09/19/2007  . Arthritis    "neck" (07/10/2018)  . Basal cell cancer    "back of my neck;  scraped it off"  . CAROTID BRUIT 01/08/2011  . GERD (gastroesophageal reflux disease)   . Hyperlipidemia   . Mobitz II    a. s/p MDT dual chamber (His Bundle) pacemaker 07/2018 - Dr Rayann Heman  . OSTEOARTHRITIS 09/19/2007    Past Surgical History:  Procedure Laterality Date  . ANTERIOR CRUCIATE LIGAMENT REPAIR Left 2001   "donor ACL"  . BREAST LUMPECTOMY Bilateral 2002   "both benign"  . INGUINAL HERNIA REPAIR Right 1971  . KNEE CARTILAGE SURGERY Left 1986   w/ACL repair  . LAPAROSCOPIC CHOLECYSTECTOMY  1994  . LEFT HEART CATH AND CORONARY ANGIOGRAPHY N/A 07/08/2018   Procedure: LEFT HEART CATH AND CORONARY ANGIOGRAPHY;  Surgeon: Leonie Man, MD;  Location: [place] [facility];  Service: Cardiovascular;; Angiographically normal coronary arteries except ~20-25% prox & mid LAD.  Normal LVEDP.  Marland Kitchen PACEMAKER IMPLANT N/A 07/10/2018   Medtronic Azure XT DR MRI SureScan model [id number] (serial number [id number]) pacemaker by Dr Rayann Heman with His bundle lead  . [place]  . TONSILLECTOMY AND ADENOIDECTOMY  1968  . TUBAL LIGATION  1982  . VAGINAL HYSTERECTOMY  1990  . WISDOM TOOTH EXTRACTION  1967    Family History  Problem Relation Age of Onset  . Breast cancer Mother        breast  . Heart disease Father        h/o Rheumatic Fever - Rheumatic MV Dz.  . Allergies Son   . Asthma Son   . Tuberculosis Maternal Grandmother        died in her 51s  . Throat cancer Maternal Grandfather   . Pneumonia Paternal Grandmother     Social History   Tobacco Use  . Smoking status: Never Smoker  . Smokeless tobacco: Never Used  Substance Use Topics  . Alcohol use: Never    Subjective:  Patient presents with concerns for 1 month history of ear pain;  Is prone to recurrent ear infections/ sinus infections;  Has noticed that her blood pressure has been mildly elevated in the past few weeks as well- wondering if it could be related to the underlying infection.   Objective:  Vitals:    02/08/20 1408  BP: (!) 148/84  Pulse: 78  Temp: 98.3 F (36.8 C)  TempSrc: Oral  SpO2: 98%  Weight: 174 lb 12.8 oz (79.3 kg)  Height: 5\' 4"  (1.626 m)    General: Well developed, well nourished, in no acute distress  Skin : Warm and dry.  Head: Normocephalic and atraumatic  Eyes: Sclera and conjunctiva clear; pupils round and reactive to light; extraocular movements intact  Ears: External normal; canals clear; tympanic membranes normal  Oropharynx: Pink, supple. No suspicious lesions  Neck: Supple without thyromegaly, adenopathy  Lungs: Respirations unlabored; clear to auscultation bilaterally without wheeze, rales, rhonchi  CVS exam: normal rate and regular rhythm.  Neurologic: Alert  and oriented; speech intact; face symmetrical; moves all extremities well; CNII-XII intact without focal deficit   Assessment:  1. Acute sinusitis, recurrence not specified, unspecified location   2. Elevated blood pressure reading     Plan:  ? If underlying infection and personal stressors have been affecting patient's blood pressure; will go ahead and treat for sinus infection; Patient will purchase new blood pressure cuff and follow-up with her PCP in 2 weeks to discuss blood pressure- she will bring cuff and log to next appointment.   This visit occurred during the SARS-CoV-2 public health emergency.  Safety protocols were in place, including screening questions prior to the visit, additional usage of staff PPE, and extensive cleaning of exam room while observing appropriate contact time as indicated for disinfecting solutions.     Return in about 2 weeks (around 02/22/2020) for with Dr. Alain Marion.  No orders of the defined types were placed in this encounter.   Requested Prescriptions   Signed Prescriptions Disp Refills  . cefUROXime (CEFTIN) 500 MG tablet 20 tablet 0    Sig: Take 1 tablet (500 mg total) by mouth 2 (two) times daily with a meal.  . predniSONE (DELTASONE) 10 MG tablet 38  tablet 0    Sig: Prednisone 10 mg: take 4 tabs a day x 3 days; then 3 tabs a day x 4 days; then 2 tabs a day x 4 days, then 1 tab a day x 6 days, then stop. Take pc.

## 2020-02-08 NOTE — Patient Instructions (Signed)
Bring new cuff and log to follow-up with Dr. Alain Marion;

## 2020-02-23 ENCOUNTER — Other Ambulatory Visit: Payer: Self-pay

## 2020-02-23 ENCOUNTER — Encounter: Payer: Self-pay | Admitting: Internal Medicine

## 2020-02-23 ENCOUNTER — Ambulatory Visit (INDEPENDENT_AMBULATORY_CARE_PROVIDER_SITE_OTHER): Payer: Medicare Other | Admitting: Internal Medicine

## 2020-02-23 DIAGNOSIS — I441 Atrioventricular block, second degree: Secondary | ICD-10-CM | POA: Diagnosis not present

## 2020-02-23 DIAGNOSIS — J0140 Acute pansinusitis, unspecified: Secondary | ICD-10-CM

## 2020-02-23 DIAGNOSIS — I10 Essential (primary) hypertension: Secondary | ICD-10-CM | POA: Insufficient documentation

## 2020-02-23 MED ORDER — LOSARTAN POTASSIUM 50 MG PO TABS: 50.0000 mg | ORAL_TABLET | Freq: Every day | ORAL | 3 refills | Status: DC

## 2020-02-23 NOTE — Assessment & Plan Note (Signed)
Losartan 

## 2020-02-23 NOTE — Assessment & Plan Note (Signed)
S/p Pacemaker

## 2020-02-23 NOTE — Assessment & Plan Note (Signed)
Better  

## 2020-02-23 NOTE — Progress Notes (Signed)
Subjective:  Patient ID: Anna Ellis, female    DOB: [birth year]  Age: 69 y.o. MRN: [id number]  CC: No chief complaint on file.   HPI Anna Ellis presents for sinusitis - treated F/u HTN   Outpatient Medications Prior to Visit  Medication Sig Dispense Refill  . acetaminophen (TYLENOL) 500 MG tablet Take 1,000 mg by mouth every 6 (six) hours as needed (for pain.).    Marland Kitchen ALPRAZolam (XANAX) 0.25 MG tablet Take 1 tablet (0.25 mg total) by mouth 2 (two) times daily as needed. 60 tablet 1  . aspirin EC 81 MG tablet Take 81 mg by mouth daily.    Marland Kitchen aspirin-acetaminophen-caffeine (EXCEDRIN MIGRAINE) 250-250-65 MG tablet Take 2 tablets by mouth every 6 (six) hours as needed for headache.    Marland Kitchen atorvastatin (LIPITOR) 20 MG tablet Take 0.5 tablets (10 mg total) by mouth daily. 1/2 tablet(10mg ) daily. Insurance will only pay for 20mg  tablet. 90 tablet 3  . Cholecalciferol (VITAMIN D3) 2000 units TABS Take 2,000 Units by mouth daily.     Marland Kitchen ELDERBERRY PO Take by mouth daily.    Marland Kitchen etodolac (LODINE) 500 MG tablet TAKE 1 TABLET TWICE A DAY AS NEEDED WITH FOOD 180 tablet 3  . metroNIDAZOLE (METROCREAM) 0.75 % cream Apply 1 application topically daily as needed (for rosacea). 45 g 3  . mometasone (NASONEX) 50 MCG/ACT nasal spray Place 2 sprays into the nose daily. 17 g 3  . montelukast (SINGULAIR) 10 MG tablet Take 1 tablet (10 mg total) by mouth daily. 90 tablet 3  . mupirocin ointment (BACTROBAN) 2 % Use bid 30 g 1  . omeprazole (PRILOSEC) 20 MG capsule Take 1 capsule (20 mg total) by mouth 2 (two) times daily before a meal. White caps please 60 capsule 11  . ondansetron (ZOFRAN) 4 MG tablet Take 1 tablet (4 mg total) by mouth every 8 (eight) hours as needed for nausea or vomiting. 20 tablet 0  . cefUROXime (CEFTIN) 500 MG tablet Take 1 tablet (500 mg total) by mouth 2 (two) times daily with a meal. 20 tablet 0  . methylPREDNISolone (MEDROL DOSEPAK) 4 MG TBPK tablet As directed 21 tablet 0   No  facility-administered medications prior to visit.    ROS: Review of Systems  Constitutional: Negative for activity change, appetite change, chills, fatigue and unexpected weight change.  HENT: Negative for congestion, mouth sores and sinus pressure.   Eyes: Negative for visual disturbance.  Respiratory: Negative for cough and chest tightness.   Gastrointestinal: Negative for abdominal pain and nausea.  Genitourinary: Negative for difficulty urinating, frequency and vaginal pain.  Musculoskeletal: Negative for back pain and gait problem.  Skin: Negative for pallor and rash.  Neurological: Negative for dizziness, tremors, syncope, weakness, numbness and headaches.  Psychiatric/Behavioral: Negative for confusion and sleep disturbance.    Objective:  BP (!) 166/92 (BP Location: Left Arm, Patient Position: Sitting, Cuff Size: Normal)   Pulse (!) 108   Temp 98.3 F (36.8 C) (Oral)   Ht 5\' 4"  (1.626 m)   Wt 175 lb (79.4 kg)   SpO2 99%   BMI 30.04 kg/m   BP Readings from Last 3 Encounters:  02/23/20 (!) 166/92  02/08/20 (!) 148/84  11/04/19 (!) 142/88    Wt Readings from Last 3 Encounters:  02/23/20 175 lb (79.4 kg)  02/08/20 174 lb 12.8 oz (79.3 kg)  11/04/19 173 lb (78.5 kg)    Physical Exam Constitutional:      General: She  is not in acute distress.    Appearance: She is well-developed.  HENT:     Head: Normocephalic.     Right Ear: External ear normal.     Left Ear: External ear normal.     Nose: Nose normal.  Eyes:     General:        Right eye: No discharge.        Left eye: No discharge.     Conjunctiva/sclera: Conjunctivae normal.     Pupils: Pupils are equal, round, and reactive to light.  Neck:     Thyroid: No thyromegaly.     Vascular: No JVD.     Trachea: No tracheal deviation.  Cardiovascular:     Rate and Rhythm: Normal rate and regular rhythm.     Heart sounds: Normal heart sounds.  Pulmonary:     Effort: No respiratory distress.     Breath  sounds: No stridor. No wheezing.  Abdominal:     General: Bowel sounds are normal. There is no distension.     Palpations: Abdomen is soft. There is no mass.     Tenderness: There is no abdominal tenderness. There is no guarding or rebound.  Musculoskeletal:        General: No tenderness.     Cervical back: Normal range of motion and neck supple.  Lymphadenopathy:     Cervical: No cervical adenopathy.  Skin:    Findings: No erythema or rash.  Neurological:     Cranial Nerves: No cranial nerve deficit.     Motor: No abnormal muscle tone.     Coordination: Coordination normal.     Deep Tendon Reflexes: Reflexes normal.  Psychiatric:        Behavior: Behavior normal.        Thought Content: Thought content normal.        Judgment: Judgment normal.     Lab Results  Component Value Date   WBC 4.6 11/04/2019   HGB 14.9 11/04/2019   HCT 43.8 11/04/2019   PLT 341.0 11/04/2019   GLUCOSE 94 11/04/2019   CHOL 163 11/04/2019   TRIG 92.0 11/04/2019   HDL 56.00 11/04/2019   LDLCALC 88 11/04/2019   ALT 13 11/04/2019   AST 16 11/04/2019   NA 139 11/04/2019   K 4.1 11/04/2019   CL 104 11/04/2019   CREATININE 0.71 11/04/2019   BUN 18 11/04/2019   CO2 27 11/04/2019   TSH 2.25 11/04/2019    DG Chest 2 View  Result Date: 07/11/2018 CLINICAL DATA:  Implanted cardiac device EXAM: CHEST - 2 VIEW COMPARISON:  01/23/2010 FINDINGS: Dual lead pacemaker inserted from a left subclavian approach with leads in region of the right atrium and right ventricle. No pneumothorax. Heart size is normal. Mediastinal shadows are otherwise normal. The lungs are clear. No effusions. IMPRESSION: Dual lead pacemaker with leads in the region of the right atrium and right ventricle. No active disease. No device complication evident. Electronically Signed   By: Nelson Chimes M.D.   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 Pacemaker implantation.  2. Left upper extremity venography.     INTRODUCTION: MARGEAUX MASSIE is a 69 y.o. 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  The patient reports intermittent episodes of shortness of breath over the past few weeks.  No reversible causes have been identified.  The patient therefore presents today for pacemaker implantation.   DESCRIPTION OF PROCEDURE:  Informed written consent was obtained, and the patient was brought to the electrophysiology lab in a fasting state.  The patient received IV Versed and Fentanyl as sedation for the procedure today.  The patients left chest was prepped and draped in the usual sterile fashion by the EP lab staff. The skin overlying the left deltopectoral region was infiltrated with lidocaine for local analgesia.  A 4-cm incision was made over the left deltopectoral region.  A left subcutaneous pacemaker pocket was fashioned using a combination of sharp and blunt dissection. Electrocautery was required to assure hemostasis.  Left Upper Extremity Venography: A venogram of the left upper extremity was performed, which revealed a large left cephalic vein, which emptied into a large left subclavian vein.  The left axillary vein was moderate in size.  RA/RV Lead Placement: The left axillary vein was cannulated.  Through the left axillary vein, a Medtronic model [id number] (serial number [id number]) right atrial lead and a Medtronic model 3830-69 (serial number [id number]) right ventricular lead were advanced with fluoroscopic visualization into the right atrial appendage and His bundle positions respectively.  Initial atrial lead P- waves measured 3.9 mV with impedance of 1004 ohms and a threshold of  1 V at 0.5 msec.  Right ventricular lead R-waves measured 10 mV with an  impedance of 817 ohms and a threshold of 1 V at 0.5 msec.  Septal capture was observed.  Extensive effort to achieve selective His capture were made without result.  Both leads were secured to the pectoralis fascia using #2-0 silk over the suture sleeves. Device Placement:  The leads were then connected to a Medtronic Azure XT DR MRI SureScan model [id number] (serial number [id number]) pacemaker.  The pocket was irrigated with copious gentamicin solution.  The pacemaker was then placed into the pocket.  The pocket was then closed in 2 layers with 2.0 Vicryl suture for the subcutaneous and subcuticular layers.  Steri- Strips and a sterile dressing were then applied. EBL<33ml. There were no early apparent complications. Permanent Pacemaker Indication: Documented non-reversible symptomatic bradycardia due to second degree and/or third degree atrioventricular block. During this procedure the patient is administered a total of Versed 2 mg and Fentanyl 25 mg to achieve and maintain moderate conscious sedation.  The patient's heart rate, blood pressure, and oxygen saturation are monitored continuously during the procedure. The period of conscious sedation is 72 minutes, of which I was present face-to-face 100% of this time.   CONCLUSIONS:  1. Successful implantation of a Medtronic Azure XT MRI conditional dual-chamber pacemaker for symptomatic  mobitz II second degree AV block  2. No early apparent complications.     Thompson Grayer, MD 07/10/2018 4:52 PM   ECHOCARDIOGRAM COMPLETE  Result Date: 07/10/2018                            *[facility]*                         [place] [address]  [place], [place] [address]                            [phone] ------------------------------------------------------------------- Transthoracic Echocardiography Patient:    Tykera, Canelo MR #:       [id number] Study Date: 07/10/2018 Gender:     F Age:        37 Height:      162.6 cm Weight:     76.7 kg BSA:        1.88 m^2 Pt. Status: Room:       [facility]  ADMITTING    Thompson Grayer, MD  ATTENDING    Thompson Grayer, MD  SONOGRAPHER  Johny Chess, RDCS, CCT  PERFORMING   [facility], Inpatient  ORDERING     Baldwin [place] cc: ------------------------------------------------------------------- LV EF: 55% -   60% ------------------------------------------------------------------- Indications:      Chest pain 786.51. ------------------------------------------------------------------- History:   PMH:  Pre pacemaker evaluation. ------------------------------------------------------------------- Study Conclusions - Left ventricle: The cavity size was normal. Systolic function was   normal. The estimated ejection fraction was in the range of 55%   to 60%. Wall motion was normal; there were no regional wall   motion abnormalities. Left ventricular diastolic function   parameters were normal. - Mitral valve: There was mild regurgitation. - Atrial septum: No defect or patent foramen ovale was identified. ------------------------------------------------------------------- Study data:  No prior study was available for comparison.  Study status:  Routine.  Procedure:  The patient reported no pain pre or post test. Transthoracic echocardiography. Image quality was good.         Transthoracic echocardiography.  M-mode, complete 2D, spectral Doppler, and color Doppler.  Birthdate:  Patient birthdate: 09/02/1951.  Age:  Patient is 69 yr old.  Sex:  Gender: female.    BMI: 29 kg/m^2.  Blood pressure:     184/81  Patient status:  Inpatient.  Study date:  Study date: 07/10/2018. Study time: 01:51 PM.  Location:  Bedside. ------------------------------------------------------------------- ------------------------------------------------------------------- Left ventricle:  The cavity size was normal. Systolic function was normal. The estimated ejection fraction was in the range of 55% to 60%. Wall motion was  normal; there were no regional wall motion abnormalities. The transmitral flow pattern was normal. The deceleration time of the early transmitral flow velocity was normal. The pulmonary vein flow pattern was normal. The tissue Doppler parameters were normal. Left ventricular diastolic function parameters were normal. ------------------------------------------------------------------- Aortic valve:   Trileaflet; normal thickness leaflets. Mobility was not restricted.  Doppler:  Transvalvular velocity was within the normal range. There was no stenosis. There was no regurgitation.  ------------------------------------------------------------------- Aorta:  The aorta was normal, not dilated, and non-diseased. Aortic root: The aortic root was normal in size. ------------------------------------------------------------------- Mitral valve:   Structurally normal valve.   Mobility was not restricted.  Doppler:  Transvalvular velocity was within the normal range. There was no evidence for stenosis. There was mild regurgitation.    Peak gradient (D): 2 mm Hg. ------------------------------------------------------------------- Left atrium:  The atrium was normal in size. ------------------------------------------------------------------- Atrial septum:  No defect or patent foramen ovale was identified.  ------------------------------------------------------------------- Right ventricle:  The cavity size was normal. Wall thickness was normal. Systolic function was normal. ------------------------------------------------------------------- Pulmonic valve:    Doppler:  Transvalvular velocity was within the normal range. There was no evidence for stenosis. There was mild regurgitation. ------------------------------------------------------------------- Tricuspid valve:   Structurally normal valve.    Doppler: Transvalvular velocity was within the normal range.  There was mild regurgitation.  ------------------------------------------------------------------- Pulmonary artery:   The main pulmonary artery was normal-sized. Systolic pressure was within the normal range. ------------------------------------------------------------------- Right atrium:  The atrium was normal in size. ------------------------------------------------------------------- Pericardium:  The pericardium was normal in appearance. There was no pericardial effusion. ------------------------------------------------------------------- Systemic veins: Inferior vena cava: The vessel was normal in size. The respirophasic diameter changes were in the normal range (>= 50%), consistent with normal central venous pressure. ------------------------------------------------------------------- Post procedure conclusions Ascending Aorta: - The aorta was normal, not dilated, and non-diseased. ------------------------------------------------------------------- Measurements  Left ventricle                         Value        Reference  LV ID, ED, PLAX chordal                44.4  mm     43 - 52  LV ID, ES, PLAX chordal                29.2  mm     23 - 38  LV fx shortening, PLAX chordal         34    %      >=29  LV PW thickness, ED                    7.47  mm     ----------  IVS/LV PW ratio, ED                    1.1          <=1.3  Stroke volume, 2D                      99    ml     ----------  Stroke volume/bsa, 2D                  53    ml/m^2 ----------  LV e&', lateral                         8.61  cm/s   ----------  LV E/e&', lateral                       9.01         ----------  LV e&', medial                          7.54  cm/s   ----------  LV E/e&', medial                        10.29        ----------  LV e&', average                         8.08  cm/s   ----------  LV E/e&', average                       9.61         ----------   Ventricular septum                     Value        Reference  IVS thickness, ED  8.25  mm      ----------   LVOT                                   Value        Reference  LVOT ID, S                             21    mm     ----------  LVOT area                              3.46  cm^2   ----------  LVOT mean velocity, S                  82.8  cm/s   ----------  LVOT VTI, S                            28.7  cm     ----------   Aorta                                  Value        Reference  Aortic root ID, ED                     27    mm     ----------   Left atrium                            Value        Reference  LA ID, A-P, ES                         35    mm     ----------  LA ID/bsa, A-P                         1.86  cm/m^2 <=2.2  LA volume, S                           46.4  ml     ----------  LA volume/bsa, S                       24.6  ml/m^2 ----------  LA volume, ES, 1-p A4C                 38.6  ml     ----------  LA volume/bsa, ES, 1-p A4C             20.5  ml/m^2 ----------  LA volume, ES, 1-p A2C                 52.6  ml     ----------  LA volume/bsa, ES, 1-p A2C             27.9  ml/m^2 ----------   Mitral valve                           Value  Reference  Mitral E-wave peak velocity            77.6  cm/s   ----------  Mitral A-wave peak velocity            74.2  cm/s   ----------  Mitral deceleration time       (H)     250   ms     150 - 230  Mitral peak gradient, D                2     mm Hg  ----------  Mitral E/A ratio, peak                 1            ----------   Pulmonary arteries                     Value        Reference  PA pressure, S, DP                     21    mm Hg  <=30   Tricuspid valve                        Value        Reference  Tricuspid regurg peak velocity         214   cm/s   ----------  Tricuspid peak RV-RA gradient          18    mm Hg  ----------   Right atrium                           Value        Reference  RA ID, S-I, ES, A4C                    46.5  mm     34 - 49  RA area, ES, A4C                       11.7  cm^2   8.3 - 19.5  RA volume, ES, A/L                      26.1  ml     ----------  RA volume/bsa, ES, A/L                 13.9  ml/m^2 ----------   Systemic veins                         Value        Reference  Estimated CVP                          3     mm Hg  ----------   Right ventricle                        Value        Reference  TAPSE                                  28.2  mm     ----------  RV pressure, S, DP                     21    mm Hg  <=30  RV s&', lateral, S                      15.7  cm/s   ---------- Legend: (L)  and  (H)  mark values outside specified reference range. ------------------------------------------------------------------- Prepared and Electronically Authenticated by Jenkins Rouge, M.D. 2019-08-01T13:25:30   Assessment & Plan:   There are no diagnoses linked to this encounter.   No orders of the defined types were placed in this encounter.    Follow-up: No follow-ups on file.  Walker Kehr, MD

## 2020-04-21 ENCOUNTER — Ambulatory Visit (INDEPENDENT_AMBULATORY_CARE_PROVIDER_SITE_OTHER): Payer: Medicare Other | Admitting: *Deleted

## 2020-04-21 DIAGNOSIS — I441 Atrioventricular block, second degree: Secondary | ICD-10-CM

## 2020-04-21 LAB — CUP PACEART REMOTE DEVICE CHECK
Battery Remaining Longevity: 119 mo
Battery Voltage: 3.01 V
Brady Statistic AP VP Percent: 25.58 %
Brady Statistic AP VS Percent: 0.14 %
Brady Statistic AS VP Percent: 73.13 %
Brady Statistic AS VS Percent: 1.16 %
Brady Statistic RA Percent Paced: 25.77 %
Brady Statistic RV Percent Paced: 98.71 %
Date Time Interrogation Session: 20210513074326
Implantable Lead Implant Date: 20190801
Implantable Lead Implant Date: 20190801
Implantable Lead Location: 753859
Implantable Lead Location: 753860
Implantable Lead Model: 3830
Implantable Lead Model: 5076
Implantable Pulse Generator Implant Date: 20190801
Lead Channel Impedance Value: 323 Ohm
Lead Channel Impedance Value: 342 Ohm
Lead Channel Impedance Value: 399 Ohm
Lead Channel Impedance Value: 418 Ohm
Lead Channel Pacing Threshold Amplitude: 0.625 V
Lead Channel Pacing Threshold Amplitude: 0.875 V
Lead Channel Pacing Threshold Pulse Width: 0.4 ms
Lead Channel Pacing Threshold Pulse Width: 0.4 ms
Lead Channel Sensing Intrinsic Amplitude: 4.375 mV
Lead Channel Sensing Intrinsic Amplitude: 4.375 mV
Lead Channel Sensing Intrinsic Amplitude: 4.625 mV
Lead Channel Sensing Intrinsic Amplitude: 4.625 mV
Lead Channel Setting Pacing Amplitude: 2 V
Lead Channel Setting Pacing Amplitude: 2.5 V
Lead Channel Setting Pacing Pulse Width: 0.4 ms
Lead Channel Setting Sensing Sensitivity: 1.2 mV

## 2020-04-25 NOTE — Progress Notes (Signed)
Remote pacemaker transmission.   

## 2020-05-26 ENCOUNTER — Ambulatory Visit (INDEPENDENT_AMBULATORY_CARE_PROVIDER_SITE_OTHER): Payer: Medicare Other | Admitting: Internal Medicine

## 2020-05-26 ENCOUNTER — Ambulatory Visit: Payer: Medicare Other

## 2020-05-26 ENCOUNTER — Encounter: Payer: Self-pay | Admitting: Internal Medicine

## 2020-05-26 ENCOUNTER — Other Ambulatory Visit: Payer: Self-pay

## 2020-05-26 DIAGNOSIS — I10 Essential (primary) hypertension: Secondary | ICD-10-CM

## 2020-05-26 DIAGNOSIS — I441 Atrioventricular block, second degree: Secondary | ICD-10-CM

## 2020-05-26 DIAGNOSIS — F419 Anxiety disorder, unspecified: Secondary | ICD-10-CM

## 2020-05-26 MED ORDER — MONTELUKAST SODIUM 10 MG PO TABS: 10.0000 mg | ORAL_TABLET | Freq: Every day | ORAL | 3 refills | Status: DC

## 2020-05-26 MED ORDER — OMEPRAZOLE 20 MG PO CPDR: 20.0000 mg | DELAYED_RELEASE_CAPSULE | Freq: Two times a day (BID) | ORAL | 3 refills | Status: DC

## 2020-05-26 NOTE — Assessment & Plan Note (Signed)
Xanax prn  Potential benefits of a long term benzodiazepines  use as well as potential risks  and complications were explained to the patient and were aknowledged. 

## 2020-05-26 NOTE — Assessment & Plan Note (Addendum)
Losartan 25 mg/d BP ok at home

## 2020-05-26 NOTE — Assessment & Plan Note (Signed)
Pacemaker 07/2018

## 2020-05-26 NOTE — Progress Notes (Signed)
Subjective:  Patient ID: Anna Ellis, female    DOB: [birth year]  Age: 69 y.o. MRN: [id number]  CC: No chief complaint on file.   HPI Anna Ellis presents for HTN, allergies, OA  Outpatient Medications Prior to Visit  Medication Sig Dispense Refill  . acetaminophen (TYLENOL) 500 MG tablet Take 1,000 mg by mouth every 6 (six) hours as needed (for pain.).    Marland Kitchen ALPRAZolam (XANAX) 0.25 MG tablet Take 1 tablet (0.25 mg total) by mouth 2 (two) times daily as needed. 60 tablet 1  . aspirin EC 81 MG tablet Take 81 mg by mouth daily.    Marland Kitchen aspirin-acetaminophen-caffeine (EXCEDRIN MIGRAINE) 250-250-65 MG tablet Take 2 tablets by mouth every 6 (six) hours as needed for headache.    Marland Kitchen atorvastatin (LIPITOR) 20 MG tablet Take 0.5 tablets (10 mg total) by mouth daily. 1/2 tablet(10mg ) daily. Insurance will only pay for 20mg  tablet. 90 tablet 3  . Cholecalciferol (VITAMIN D3) 2000 units TABS Take 2,000 Units by mouth daily.     Marland Kitchen ELDERBERRY PO Take by mouth daily.    Marland Kitchen etodolac (LODINE) 500 MG tablet TAKE 1 TABLET TWICE A DAY AS NEEDED WITH FOOD 180 tablet 3  . losartan (COZAAR) 50 MG tablet Take 1 tablet (50 mg total) by mouth daily. 90 tablet 3  . metroNIDAZOLE (METROCREAM) 0.75 % cream Apply 1 application topically daily as needed (for rosacea). 45 g 3  . mometasone (NASONEX) 50 MCG/ACT nasal spray Place 2 sprays into the nose daily. 17 g 3  . montelukast (SINGULAIR) 10 MG tablet Take 1 tablet (10 mg total) by mouth daily. 90 tablet 3  . mupirocin ointment (BACTROBAN) 2 % Use bid 30 g 1  . omeprazole (PRILOSEC) 20 MG capsule Take 1 capsule (20 mg total) by mouth 2 (two) times daily before a meal. White caps please 60 capsule 11  . ondansetron (ZOFRAN) 4 MG tablet Take 1 tablet (4 mg total) by mouth every 8 (eight) hours as needed for nausea or vomiting. 20 tablet 0   No facility-administered medications prior to visit.    ROS: Review of Systems  Constitutional: Negative for activity  change, appetite change, chills, diaphoresis, fatigue, fever and unexpected weight change.  HENT: Negative for congestion, dental problem, ear pain, hearing loss, mouth sores, postnasal drip, sinus pressure, sneezing, sore throat and voice change.   Eyes: Negative for pain and visual disturbance.  Respiratory: Negative for cough, chest tightness, wheezing and stridor.   Cardiovascular: Negative for chest pain, palpitations and leg swelling.  Gastrointestinal: Negative for abdominal distention, abdominal pain, blood in stool, nausea, rectal pain and vomiting.  Genitourinary: Negative for decreased urine volume, difficulty urinating, dysuria, frequency, hematuria, menstrual problem, vaginal bleeding, vaginal discharge and vaginal pain.  Musculoskeletal: Negative for arthralgias, back pain, gait problem, joint swelling and neck pain.  Skin: Negative for color change, rash and wound.  Neurological: Negative for dizziness, tremors, syncope, speech difficulty, weakness and light-headedness.  Hematological: Negative for adenopathy.  Psychiatric/Behavioral: Negative for behavioral problems, confusion, decreased concentration, dysphoric mood, hallucinations, sleep disturbance and suicidal ideas. The patient is not nervous/anxious and is not hyperactive.     Objective:  BP (!) 190/98 (BP Location: Right Arm, Patient Position: Sitting, Cuff Size: Normal)   Pulse 95   Temp 97.9 F (36.6 C) (Oral)   Ht 5\' 4"  (1.626 m)   Wt 175 lb (79.4 kg)   SpO2 96%   BMI 30.04 kg/m   BP Readings from Last  3 Encounters:  05/26/20 (!) 190/98  02/23/20 (!) 166/92  02/08/20 (!) 148/84    Wt Readings from Last 3 Encounters:  05/26/20 175 lb (79.4 kg)  02/23/20 175 lb (79.4 kg)  02/08/20 174 lb 12.8 oz (79.3 kg)    Physical Exam Constitutional:      General: She is not in acute distress.    Appearance: She is well-developed.  HENT:     Head: Normocephalic.     Right Ear: External ear normal.     Left Ear:  External ear normal.     Nose: Nose normal.  Eyes:     General:        Right eye: No discharge.        Left eye: No discharge.     Conjunctiva/sclera: Conjunctivae normal.     Pupils: Pupils are equal, round, and reactive to light.  Neck:     Thyroid: No thyromegaly.     Vascular: No JVD.     Trachea: No tracheal deviation.  Cardiovascular:     Rate and Rhythm: Normal rate and regular rhythm.     Heart sounds: Normal heart sounds.  Pulmonary:     Effort: No respiratory distress.     Breath sounds: No stridor. No wheezing.  Abdominal:     General: Bowel sounds are normal. There is no distension.     Palpations: Abdomen is soft. There is no mass.     Tenderness: There is no abdominal tenderness. There is no guarding or rebound.  Musculoskeletal:        General: No tenderness.     Cervical back: Normal range of motion and neck supple.  Lymphadenopathy:     Cervical: No cervical adenopathy.  Skin:    Findings: No erythema or rash.  Neurological:     Mental Status: She is oriented to person, place, and time.     Cranial Nerves: No cranial nerve deficit.     Motor: No abnormal muscle tone.     Coordination: Coordination normal.     Deep Tendon Reflexes: Reflexes normal.  Psychiatric:        Behavior: Behavior normal.        Thought Content: Thought content normal.        Judgment: Judgment normal.     Lab Results  Component Value Date   WBC 4.6 11/04/2019   HGB 14.9 11/04/2019   HCT 43.8 11/04/2019   PLT 341.0 11/04/2019   GLUCOSE 94 11/04/2019   CHOL 163 11/04/2019   TRIG 92.0 11/04/2019   HDL 56.00 11/04/2019   LDLCALC 88 11/04/2019   ALT 13 11/04/2019   AST 16 11/04/2019   NA 139 11/04/2019   K 4.1 11/04/2019   CL 104 11/04/2019   CREATININE 0.71 11/04/2019   BUN 18 11/04/2019   CO2 27 11/04/2019   TSH 2.25 11/04/2019    DG Chest 2 View  Result Date: 07/11/2018 CLINICAL DATA:  Implanted cardiac device EXAM: CHEST - 2 VIEW COMPARISON:  01/23/2010 FINDINGS:  Dual lead pacemaker inserted from a left subclavian approach with leads in region of the right atrium and right ventricle. No pneumothorax. Heart size is normal. Mediastinal shadows are otherwise normal. The lungs are clear. No effusions. IMPRESSION: Dual lead pacemaker with leads in the region of the right atrium and right ventricle. No active disease. No device complication evident. Electronically Signed   By: Nelson Chimes M.D.   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 Pacemaker implantation.  2. Left upper extremity venography.     INTRODUCTION: KAMILIA CAROLLO is a 69 y.o. 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  The patient reports intermittent episodes of shortness of breath over the past few weeks.  No reversible causes have been identified.  The patient therefore presents today for pacemaker implantation.   DESCRIPTION OF PROCEDURE:  Informed written consent was obtained, and the patient was brought to the electrophysiology lab in a fasting state.  The patient received IV Versed and Fentanyl as sedation for the procedure today.  The patients left chest was prepped and draped in the usual sterile fashion by the EP lab staff. The skin overlying the left deltopectoral region was infiltrated with lidocaine for local analgesia.  A 4-cm incision was made over the left deltopectoral region.  A left subcutaneous pacemaker pocket was fashioned using a combination of sharp and blunt dissection. Electrocautery was required to assure hemostasis.  Left Upper Extremity Venography: A venogram of the left upper extremity was performed, which revealed a large left cephalic vein, which emptied into a large left subclavian vein.  The  left axillary vein was moderate in size.  RA/RV Lead Placement: The left axillary vein was cannulated.  Through the left axillary vein, a Medtronic model [id number] (serial number [id number]) right atrial lead and a Medtronic model 3830-69 (serial number [id number]) right ventricular lead were advanced with fluoroscopic visualization into the right atrial appendage and His bundle positions respectively.  Initial atrial lead P- waves measured 3.9 mV with impedance of 1004 ohms and a threshold of  1 V at 0.5 msec.  Right ventricular lead R-waves measured 10 mV with an impedance of 817 ohms and a threshold of 1 V at 0.5 msec.  Septal capture was observed.  Extensive effort to achieve selective His capture were made without result.  Both leads were secured to the pectoralis fascia using #2-0 silk over the suture sleeves. Device Placement:  The leads were then connected to a Medtronic Azure XT DR MRI SureScan model [id number] (serial number [id number]) pacemaker.  The pocket was irrigated with copious gentamicin solution.  The pacemaker was then placed into the pocket.  The pocket was then closed in 2 layers with 2.0 Vicryl suture for the subcutaneous and subcuticular layers.  Steri- Strips and a sterile dressing were then applied. EBL<48ml. There were no early apparent complications. Permanent Pacemaker Indication: Documented non-reversible symptomatic bradycardia due to second degree and/or third degree atrioventricular block. During this procedure the patient is administered a total of Versed 2 mg and Fentanyl 25 mg to achieve and maintain moderate conscious sedation.  The patient's heart rate, blood pressure, and oxygen saturation are monitored continuously during the procedure. The period of conscious sedation is 72 minutes, of which I was present face-to-face 100% of this time.   CONCLUSIONS:  1. Successful implantation of a Medtronic Azure XT MRI conditional dual-chamber pacemaker for symptomatic  mobitz II second  degree AV block  2. No early apparent complications.     Thompson Grayer, MD 07/10/2018 4:52 PM   ECHOCARDIOGRAM COMPLETE  Result Date: 07/10/2018                            *  [facility]*                         [place],  [address]                            [phone] ------------------------------------------------------------------- Transthoracic Echocardiography Patient:    Shaolin, Armas MR #:       [id number] Study Date: 07/10/2018 Gender:     F Age:        20 Height:     162.6 cm Weight:     76.7 kg BSA:        1.88 m^2 Pt. Status: Room:       [facility]  ADMITTING    Thompson Grayer, MD  ATTENDING    Thompson Grayer, MD  SONOGRAPHER  Johny Chess, RDCS, CCT  PERFORMING   [facility], Inpatient  ORDERING     Baldwin [place] cc: ------------------------------------------------------------------- LV EF: 55% -   60% ------------------------------------------------------------------- Indications:      Chest pain 786.51. ------------------------------------------------------------------- History:   PMH:  Pre pacemaker evaluation. ------------------------------------------------------------------- Study Conclusions - Left ventricle: The cavity size was normal. Systolic function was   normal. The estimated ejection fraction was in the range of 55%   to 60%. Wall motion was normal; there were no regional wall   motion abnormalities. Left ventricular diastolic function   parameters were normal. - Mitral valve: There was mild regurgitation. - Atrial septum: No defect or patent foramen ovale was identified. ------------------------------------------------------------------- Study data:  No prior study was available for comparison.  Study status:  Routine.  Procedure:  The patient reported no pain pre or post test. Transthoracic echocardiography. Image quality was good.         Transthoracic echocardiography.  M-mode, complete 2D, spectral  Doppler, and color Doppler.  Birthdate:  Patient birthdate: [birth year].  Age:  Patient is 69 yr old.  Sex:  Gender: female.    BMI: 29 kg/m^2.  Blood pressure:     184/81  Patient status:  Inpatient.  Study date:  Study date: 07/10/2018. Study time: 01:51 PM.  Location:  Bedside. ------------------------------------------------------------------- ------------------------------------------------------------------- Left ventricle:  The cavity size was normal. Systolic function was normal. The estimated ejection fraction was in the range of 55% to 60%. Wall motion was normal; there were no regional wall motion abnormalities. The transmitral flow pattern was normal. The deceleration time of the early transmitral flow velocity was normal. The pulmonary vein flow pattern was normal. The tissue Doppler parameters were normal. Left ventricular diastolic function parameters were normal. ------------------------------------------------------------------- Aortic valve:   Trileaflet; normal thickness leaflets. Mobility was not restricted.  Doppler:  Transvalvular velocity was within the normal range. There was no stenosis. There was no regurgitation.  ------------------------------------------------------------------- Aorta:  The aorta was normal, not dilated, and non-diseased. Aortic root: The aortic root was normal in size. ------------------------------------------------------------------- Mitral valve:   Structurally normal valve.   Mobility was not restricted.  Doppler:  Transvalvular velocity was within the normal range. There was no evidence for stenosis. There was mild regurgitation.    Peak gradient (D): 2 mm Hg. ------------------------------------------------------------------- Left atrium:  The atrium was normal in size. -------------------------------------------------------------------  Atrial septum:  No defect or patent foramen ovale was identified.   ------------------------------------------------------------------- Right ventricle:  The cavity size was normal. Wall thickness was normal. Systolic function was normal. ------------------------------------------------------------------- Pulmonic valve:    Doppler:  Transvalvular velocity was within the normal range. There was no evidence for stenosis. There was mild regurgitation. ------------------------------------------------------------------- Tricuspid valve:   Structurally normal valve.    Doppler: Transvalvular velocity was within the normal range. There was mild regurgitation. ------------------------------------------------------------------- Pulmonary artery:   The main pulmonary artery was normal-sized. Systolic pressure was within the normal range. ------------------------------------------------------------------- Right atrium:  The atrium was normal in size. ------------------------------------------------------------------- Pericardium:  The pericardium was normal in appearance. There was no pericardial effusion. ------------------------------------------------------------------- Systemic veins: Inferior vena cava: The vessel was normal in size. The respirophasic diameter changes were in the normal range (>= 50%), consistent with normal central venous pressure. ------------------------------------------------------------------- Post procedure conclusions Ascending Aorta: - The aorta was normal, not dilated, and non-diseased. ------------------------------------------------------------------- Measurements  Left ventricle                         Value        Reference  LV ID, ED, PLAX chordal                44.4  mm     43 - 52  LV ID, ES, PLAX chordal                29.2  mm     23 - 38  LV fx shortening, PLAX chordal         34    %      >=29  LV PW thickness, ED                    7.47  mm     ----------  IVS/LV PW ratio, ED                    1.1          <=1.3  Stroke volume, 2D                       99    ml     ----------  Stroke volume/bsa, 2D                  53    ml/m^2 ----------  LV e&', lateral                         8.61  cm/s   ----------  LV E/e&', lateral                       9.01         ----------  LV e&', medial                          7.54  cm/s   ----------  LV E/e&', medial                        10.29        ----------  LV e&', average                         8.08  cm/s   ----------  LV E/e&', average  9.61         ----------   Ventricular septum                     Value        Reference  IVS thickness, ED                      8.25  mm     ----------   LVOT                                   Value        Reference  LVOT ID, S                             21    mm     ----------  LVOT area                              3.46  cm^2   ----------  LVOT mean velocity, S                  82.8  cm/s   ----------  LVOT VTI, S                            28.7  cm     ----------   Aorta                                  Value        Reference  Aortic root ID, ED                     27    mm     ----------   Left atrium                            Value        Reference  LA ID, A-P, ES                         35    mm     ----------  LA ID/bsa, A-P                         1.86  cm/m^2 <=2.2  LA volume, S                           46.4  ml     ----------  LA volume/bsa, S                       24.6  ml/m^2 ----------  LA volume, ES, 1-p A4C                 38.6  ml     ----------  LA volume/bsa, ES, 1-p A4C             20.5  ml/m^2 ----------  LA volume, ES, 1-p A2C  52.6  ml     ----------  LA volume/bsa, ES, 1-p A2C             27.9  ml/m^2 ----------   Mitral valve                           Value        Reference  Mitral E-wave peak velocity            77.6  cm/s   ----------  Mitral A-wave peak velocity            74.2  cm/s   ----------  Mitral deceleration time       (H)     250   ms     150 - 230  Mitral peak gradient, D                2     mm Hg  ----------   Mitral E/A ratio, peak                 1            ----------   Pulmonary arteries                     Value        Reference  PA pressure, S, DP                     21    mm Hg  <=30   Tricuspid valve                        Value        Reference  Tricuspid regurg peak velocity         214   cm/s   ----------  Tricuspid peak RV-RA gradient          18    mm Hg  ----------   Right atrium                           Value        Reference  RA ID, S-I, ES, A4C                    46.5  mm     34 - 49  RA area, ES, A4C                       11.7  cm^2   8.3 - 19.5  RA volume, ES, A/L                     26.1  ml     ----------  RA volume/bsa, ES, A/L                 13.9  ml/m^2 ----------   Systemic veins                         Value        Reference  Estimated CVP                          3     mm Hg  ----------   Right ventricle  Value        Reference  TAPSE                                  28.2  mm     ----------  RV pressure, S, DP                     21    mm Hg  <=30  RV s&', lateral, S                      15.7  cm/s   ---------- Legend: (L)  and  (H)  mark values outside specified reference range. ------------------------------------------------------------------- Prepared and Electronically Authenticated by Jenkins Rouge, M.D. 2019-08-01T13:25:30   Assessment & Plan:   There are no diagnoses linked to this encounter.   No orders of the defined types were placed in this encounter.    Follow-up: No follow-ups on file.  Walker Kehr, MD

## 2020-07-21 ENCOUNTER — Ambulatory Visit (INDEPENDENT_AMBULATORY_CARE_PROVIDER_SITE_OTHER): Payer: Medicare Other | Admitting: *Deleted

## 2020-07-21 DIAGNOSIS — I441 Atrioventricular block, second degree: Secondary | ICD-10-CM

## 2020-07-21 LAB — CUP PACEART REMOTE DEVICE CHECK
Battery Remaining Longevity: 115 mo
Battery Voltage: 3 V
Brady Statistic AP VP Percent: 27.67 %
Brady Statistic AP VS Percent: 0.36 %
Brady Statistic AS VP Percent: 68.57 %
Brady Statistic AS VS Percent: 3.4 %
Brady Statistic RA Percent Paced: 28.06 %
Brady Statistic RV Percent Paced: 96.24 %
Date Time Interrogation Session: 20210811193548
Implantable Lead Implant Date: 20190801
Implantable Lead Implant Date: 20190801
Implantable Lead Location: 753859
Implantable Lead Location: 753860
Implantable Lead Model: 3830
Implantable Lead Model: 5076
Implantable Pulse Generator Implant Date: 20190801
Lead Channel Impedance Value: 323 Ohm
Lead Channel Impedance Value: 323 Ohm
Lead Channel Impedance Value: 418 Ohm
Lead Channel Impedance Value: 418 Ohm
Lead Channel Pacing Threshold Amplitude: 0.625 V
Lead Channel Pacing Threshold Amplitude: 0.75 V
Lead Channel Pacing Threshold Pulse Width: 0.4 ms
Lead Channel Pacing Threshold Pulse Width: 0.4 ms
Lead Channel Sensing Intrinsic Amplitude: 4.875 mV
Lead Channel Sensing Intrinsic Amplitude: 4.875 mV
Lead Channel Sensing Intrinsic Amplitude: 6.75 mV
Lead Channel Sensing Intrinsic Amplitude: 6.75 mV
Lead Channel Setting Pacing Amplitude: 2 V
Lead Channel Setting Pacing Amplitude: 2.5 V
Lead Channel Setting Pacing Pulse Width: 0.4 ms
Lead Channel Setting Sensing Sensitivity: 1.2 mV

## 2020-07-25 NOTE — Progress Notes (Signed)
Remote pacemaker transmission.   

## 2020-08-04 ENCOUNTER — Other Ambulatory Visit: Payer: Self-pay | Admitting: Internal Medicine

## 2020-08-04 DIAGNOSIS — J019 Acute sinusitis, unspecified: Secondary | ICD-10-CM

## 2020-08-04 MED ORDER — CEFUROXIME AXETIL 500 MG PO TABS: 500.0000 mg | ORAL_TABLET | Freq: Two times a day (BID) | ORAL | 0 refills | Status: DC

## 2020-08-04 MED ORDER — METHYLPREDNISOLONE 4 MG PO TBPK
ORAL_TABLET | ORAL | 0 refills | Status: DC
Start: 2020-08-04 — End: 2020-08-23

## 2020-08-08 ENCOUNTER — Telehealth: Payer: Medicare Other | Admitting: Family

## 2020-08-20 ENCOUNTER — Other Ambulatory Visit: Payer: Self-pay | Admitting: Internal Medicine

## 2020-10-20 ENCOUNTER — Ambulatory Visit (INDEPENDENT_AMBULATORY_CARE_PROVIDER_SITE_OTHER): Payer: Medicare Other

## 2020-10-20 DIAGNOSIS — I441 Atrioventricular block, second degree: Secondary | ICD-10-CM

## 2020-10-20 LAB — CUP PACEART REMOTE DEVICE CHECK
Battery Remaining Longevity: 111 mo
Battery Voltage: 3 V
Brady Statistic AP VP Percent: 30.84 %
Brady Statistic AP VS Percent: 0.37 %
Brady Statistic AS VP Percent: 63.9 %
Brady Statistic AS VS Percent: 4.89 %
Brady Statistic RA Percent Paced: 31.23 %
Brady Statistic RV Percent Paced: 94.74 %
Date Time Interrogation Session: 20211110193718
Implantable Lead Implant Date: 20190801
Implantable Lead Implant Date: 20190801
Implantable Lead Location: 753859
Implantable Lead Location: 753860
Implantable Lead Model: 3830
Implantable Lead Model: 5076
Implantable Pulse Generator Implant Date: 20190801
Lead Channel Impedance Value: 323 Ohm
Lead Channel Impedance Value: 323 Ohm
Lead Channel Impedance Value: 399 Ohm
Lead Channel Impedance Value: 418 Ohm
Lead Channel Pacing Threshold Amplitude: 0.625 V
Lead Channel Pacing Threshold Amplitude: 0.875 V
Lead Channel Pacing Threshold Pulse Width: 0.4 ms
Lead Channel Pacing Threshold Pulse Width: 0.4 ms
Lead Channel Sensing Intrinsic Amplitude: 4.875 mV
Lead Channel Sensing Intrinsic Amplitude: 4.875 mV
Lead Channel Sensing Intrinsic Amplitude: 6.25 mV
Lead Channel Sensing Intrinsic Amplitude: 6.25 mV
Lead Channel Setting Pacing Amplitude: 2 V
Lead Channel Setting Pacing Amplitude: 2.5 V
Lead Channel Setting Pacing Pulse Width: 0.4 ms
Lead Channel Setting Sensing Sensitivity: 1.2 mV

## 2020-10-24 NOTE — Progress Notes (Signed)
Remote pacemaker transmission.   

## 2020-11-08 ENCOUNTER — Other Ambulatory Visit: Payer: Self-pay

## 2020-11-08 ENCOUNTER — Ambulatory Visit (INDEPENDENT_AMBULATORY_CARE_PROVIDER_SITE_OTHER): Payer: Medicare Other | Admitting: Internal Medicine

## 2020-11-08 ENCOUNTER — Encounter: Payer: Self-pay | Admitting: Internal Medicine

## 2020-11-08 VITALS — BP 140/84 | HR 74 | Temp 97.9°F | Ht 64.0 in | Wt 174.6 lb

## 2020-11-08 DIAGNOSIS — Z0000 Encounter for general adult medical examination without abnormal findings: Secondary | ICD-10-CM | POA: Diagnosis not present

## 2020-11-08 DIAGNOSIS — E785 Hyperlipidemia, unspecified: Secondary | ICD-10-CM

## 2020-11-08 DIAGNOSIS — N959 Unspecified menopausal and perimenopausal disorder: Secondary | ICD-10-CM

## 2020-11-08 DIAGNOSIS — I441 Atrioventricular block, second degree: Secondary | ICD-10-CM | POA: Diagnosis not present

## 2020-11-08 LAB — CBC WITH DIFFERENTIAL/PLATELET
Basophils Absolute: 0 10*3/uL (ref 0.0–0.1)
Basophils Relative: 0.7 % (ref 0.0–3.0)
Eosinophils Absolute: 0.2 10*3/uL (ref 0.0–0.7)
Eosinophils Relative: 3.6 % (ref 0.0–5.0)
HCT: 42.8 % (ref 36.0–46.0)
Hemoglobin: 14.5 g/dL (ref 12.0–15.0)
Lymphocytes Relative: 31.7 % (ref 12.0–46.0)
Lymphs Abs: 1.6 10*3/uL (ref 0.7–4.0)
MCHC: 33.9 g/dL (ref 30.0–36.0)
MCV: 93.6 fl (ref 78.0–100.0)
Monocytes Absolute: 0.6 10*3/uL (ref 0.1–1.0)
Monocytes Relative: 11.9 % (ref 3.0–12.0)
Neutro Abs: 2.6 10*3/uL (ref 1.4–7.7)
Neutrophils Relative %: 52.1 % (ref 43.0–77.0)
Platelets: 342 10*3/uL (ref 150.0–400.0)
RBC: 4.57 Mil/uL (ref 3.87–5.11)
RDW: 13.3 % (ref 11.5–15.5)
WBC: 5 10*3/uL (ref 4.0–10.5)

## 2020-11-08 LAB — URINALYSIS
Ketones, ur: NEGATIVE
Leukocytes,Ua: NEGATIVE
Nitrite: NEGATIVE
Specific Gravity, Urine: >=1.03 — AB (ref 1.000–1.030)
Total Protein, Urine: NEGATIVE
Urine Glucose: NEGATIVE
Urobilinogen, UA: 0.2 (ref 0.0–1.0)
pH: 6 (ref 5.0–8.0)

## 2020-11-08 LAB — LIPID PANEL
Cholesterol: 152 mg/dL (ref 0–200)
HDL: 59.1 mg/dL (ref >39.00)
LDL Cholesterol: 68 mg/dL (ref 0–99)
NonHDL: 92.99
Total CHOL/HDL Ratio: 3
Triglycerides: 124 mg/dL (ref 0.0–149.0)
VLDL: 24.8 mg/dL (ref 0.0–40.0)

## 2020-11-08 LAB — COMPREHENSIVE METABOLIC PANEL
ALT: 17 U/L (ref 0–35)
AST: 18 U/L (ref 0–37)
Albumin: 4.5 g/dL (ref 3.5–5.2)
Alkaline Phosphatase: 64 U/L (ref 39–117)
BUN: 18 mg/dL (ref 6–23)
CO2: 30 mEq/L (ref 19–32)
Calcium: 9.7 mg/dL (ref 8.4–10.5)
Chloride: 103 mEq/L (ref 96–112)
Creatinine, Ser: 0.69 mg/dL (ref 0.40–1.20)
GFR: 87.34 mL/min (ref >60.00)
Glucose, Bld: 92 mg/dL (ref 70–99)
Potassium: 3.9 mEq/L (ref 3.5–5.1)
Sodium: 140 mEq/L (ref 135–145)
Total Bilirubin: 0.6 mg/dL (ref 0.2–1.2)
Total Protein: 7.2 g/dL (ref 6.0–8.3)

## 2020-11-08 LAB — TSH: TSH: 2.22 u[IU]/mL (ref 0.35–4.50)

## 2020-11-08 MED ORDER — LOSARTAN POTASSIUM 50 MG PO TABS: 50.0000 mg | ORAL_TABLET | Freq: Every day | ORAL | 3 refills | Status: DC

## 2020-11-08 MED ORDER — OMEPRAZOLE 20 MG PO CPDR
20.0000 mg | DELAYED_RELEASE_CAPSULE | Freq: Two times a day (BID) | ORAL | 3 refills | Status: DC
Start: 2020-11-08 — End: 2022-01-03

## 2020-11-08 MED ORDER — ETODOLAC 500 MG PO TABS: ORAL_TABLET | ORAL | 3 refills | Status: DC

## 2020-11-08 MED ORDER — MONTELUKAST SODIUM 10 MG PO TABS: 10.0000 mg | ORAL_TABLET | Freq: Every day | ORAL | 3 refills | Status: DC

## 2020-11-08 MED ORDER — ATORVASTATIN CALCIUM 20 MG PO TABS: 10.0000 mg | ORAL_TABLET | Freq: Every day | ORAL | 3 refills | Status: DC

## 2020-11-08 MED ORDER — ALPRAZOLAM 0.25 MG PO TABS: 0.2500 mg | ORAL_TABLET | Freq: Two times a day (BID) | ORAL | 1 refills | Status: DC | PRN

## 2020-11-08 NOTE — Progress Notes (Signed)
Subjective:  Patient ID: Anna Ellis, female    DOB: 1951-01-28  Age: 69 y.o. MRN: [id number]  CC: Annual Exam (CPE/labs.  fasting today. c/o couple days of dizziness, wants to discuss BP. )   HPI Anna Ellis presents for a well exam  Outpatient Medications Prior to Visit  Medication Sig Dispense Refill  . acetaminophen (TYLENOL) 500 MG tablet Take 1,000 mg by mouth every 6 (six) hours as needed (for pain.).    Marland Kitchen ALPRAZolam (XANAX) 0.25 MG tablet Take 1 tablet (0.25 mg total) by mouth 2 (two) times daily as needed. 60 tablet 1  . aspirin EC 81 MG tablet Take 81 mg by mouth daily.    Marland Kitchen aspirin-acetaminophen-caffeine (EXCEDRIN MIGRAINE) 250-250-65 MG tablet Take 2 tablets by mouth every 6 (six) hours as needed for headache.    Marland Kitchen atorvastatin (LIPITOR) 20 MG tablet Take 0.5 tablets (10 mg total) by mouth daily. 1/2 tablet(10mg ) daily. Insurance will only pay for 20mg  tablet. 90 tablet 3  . cefUROXime (CEFTIN) 500 MG tablet TAKE 1 TABLET BY MOUTH TWICE A DAY 20 tablet 0  . Cholecalciferol (VITAMIN D3) 2000 units TABS Take 2,000 Units by mouth daily.     Marland Kitchen ELDERBERRY PO Take by mouth daily.    Marland Kitchen etodolac (LODINE) 500 MG tablet TAKE 1 TABLET TWICE A DAY AS NEEDED WITH FOOD 180 tablet 3  . losartan (COZAAR) 50 MG tablet Take 1 tablet (50 mg total) by mouth daily. 90 tablet 3  . methylPREDNISolone (MEDROL DOSEPAK) 4 MG TBPK tablet TAKE 6 TABLETS ON DAY 1 AS DIRECTED ON PACKAGE AND DECREASE BY 1 TAB EACH DAY FOR A TOTAL OF 6 DAYS 21 each 0  . metroNIDAZOLE (METROCREAM) 0.75 % cream Apply 1 application topically daily as needed (for rosacea). 45 g 3  . mometasone (NASONEX) 50 MCG/ACT nasal spray Place 2 sprays into the nose daily. 17 g 3  . montelukast (SINGULAIR) 10 MG tablet Take 1 tablet (10 mg total) by mouth daily. 90 tablet 3  . mupirocin ointment (BACTROBAN) 2 % Use bid 30 g 1  . omeprazole (PRILOSEC) 20 MG capsule Take 1 capsule (20 mg total) by mouth 2 (two) times daily before a  meal. White caps please 180 capsule 3  . ondansetron (ZOFRAN) 4 MG tablet Take 1 tablet (4 mg total) by mouth every 8 (eight) hours as needed for nausea or vomiting. 20 tablet 0   No facility-administered medications prior to visit.    ROS: Review of Systems  Constitutional: Negative for activity change, appetite change, chills, fatigue and unexpected weight change.  HENT: Negative for congestion, mouth sores and sinus pressure.   Eyes: Negative for visual disturbance.  Respiratory: Negative for cough and chest tightness.   Gastrointestinal: Negative for abdominal pain and nausea.  Genitourinary: Negative for difficulty urinating, frequency and vaginal pain.  Musculoskeletal: Negative for back pain and gait problem.  Skin: Negative for pallor and rash.  Neurological: Negative for dizziness, tremors, weakness, numbness and headaches.  Psychiatric/Behavioral: Negative for confusion and sleep disturbance.    Objective:  BP 140/84   Pulse 74   Temp 97.9 F (36.6 C) (Oral)   Ht 5\' 4"  (1.626 m)   Wt 174 lb 9.6 oz (79.2 kg)   SpO2 99%   BMI 29.97 kg/m   BP Readings from Last 3 Encounters:  11/08/20 140/84  05/26/20 135/80  02/23/20 (!) 166/92    Wt Readings from Last 3 Encounters:  11/08/20 174 lb 9.6  oz (79.2 kg)  05/26/20 175 lb (79.4 kg)  02/23/20 175 lb (79.4 kg)    Physical Exam Constitutional:      General: She is not in acute distress.    Appearance: She is well-developed.  HENT:     Head: Normocephalic.     Right Ear: External ear normal.     Left Ear: External ear normal.     Nose: Nose normal.  Eyes:     General:        Right eye: No discharge.        Left eye: No discharge.     Conjunctiva/sclera: Conjunctivae normal.     Pupils: Pupils are equal, round, and reactive to light.  Neck:     Thyroid: No thyromegaly.     Vascular: No JVD.     Trachea: No tracheal deviation.  Cardiovascular:     Rate and Rhythm: Normal rate and regular rhythm.     Heart  sounds: Normal heart sounds.  Pulmonary:     Effort: No respiratory distress.     Breath sounds: No stridor. No wheezing.  Abdominal:     General: Bowel sounds are normal. There is no distension.     Palpations: Abdomen is soft. There is no mass.     Tenderness: There is no abdominal tenderness. There is no guarding or rebound.  Musculoskeletal:        General: No tenderness.     Cervical back: Normal range of motion and neck supple.  Lymphadenopathy:     Cervical: No cervical adenopathy.  Skin:    Findings: No erythema or rash.  Neurological:     Mental Status: She is oriented to person, place, and time.     Cranial Nerves: No cranial nerve deficit.     Motor: No abnormal muscle tone.     Coordination: Coordination normal.     Deep Tendon Reflexes: Reflexes normal.  Psychiatric:        Behavior: Behavior normal.        Thought Content: Thought content normal.        Judgment: Judgment normal.     Lab Results  Component Value Date   WBC 4.6 11/04/2019   HGB 14.9 11/04/2019   HCT 43.8 11/04/2019   PLT 341.0 11/04/2019   GLUCOSE 94 11/04/2019   CHOL 163 11/04/2019   TRIG 92.0 11/04/2019   HDL 56.00 11/04/2019   LDLCALC 88 11/04/2019   ALT 13 11/04/2019   AST 16 11/04/2019   NA 139 11/04/2019   K 4.1 11/04/2019   CL 104 11/04/2019   CREATININE 0.71 11/04/2019   BUN 18 11/04/2019   CO2 27 11/04/2019   TSH 2.25 11/04/2019    DG Chest 2 View  Result Date: 07/11/2018 CLINICAL DATA:  Implanted cardiac device EXAM: CHEST - 2 VIEW COMPARISON:  01/23/2010 FINDINGS: Dual lead pacemaker inserted from a left subclavian approach with leads in region of the right atrium and right ventricle. No pneumothorax. Heart size is normal. Mediastinal shadows are otherwise normal. The lungs are clear. No effusions. IMPRESSION: Dual lead pacemaker with leads in the region of the right atrium and right ventricle. No active disease. No device complication evident. Electronically Signed   By:  Nelson Chimes M.D.   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 Pacemaker implantation.  2. Left upper extremity venography.     INTRODUCTION: SHERLONDA FLATER is a 69 y.o. 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  The patient reports intermittent episodes of shortness of breath over the past few weeks.  No reversible causes have been identified.  The patient therefore presents today for pacemaker implantation.   DESCRIPTION OF PROCEDURE:  Informed written consent was obtained, and the patient was brought to the electrophysiology lab in a fasting state.  The patient received IV Versed and Fentanyl as sedation for the procedure today.  The patients left chest was prepped and draped in the usual sterile fashion by the EP lab staff. The skin overlying the left deltopectoral region was infiltrated with lidocaine for local analgesia.  A 4-cm incision was made over the left deltopectoral region.  A left subcutaneous pacemaker pocket was fashioned using a combination of sharp and blunt dissection. Electrocautery was required to assure hemostasis.  Left Upper Extremity Venography: A venogram of the left upper extremity was performed, which revealed a large left cephalic vein, which emptied into a large left subclavian vein.  The left axillary vein was moderate in size.  RA/RV Lead Placement: The left axillary vein was cannulated.  Through the left axillary vein, a Medtronic model [id number] (serial number [id number]) right atrial lead and a Medtronic model 3830-69 (serial number [id number]) right ventricular lead were advanced with fluoroscopic visualization into the right atrial appendage and His bundle positions respectively.  Initial  atrial lead P- waves measured 3.9 mV with impedance of 1004 ohms and a threshold of  1 V at 0.5 msec.  Right ventricular lead R-waves measured 10 mV with an impedance of 817 ohms and a threshold of 1 V at 0.5 msec.  Septal capture was observed.  Extensive effort to achieve selective His capture were made without result.  Both leads were secured to the pectoralis fascia using #2-0 silk over the suture sleeves. Device Placement:  The leads were then connected to a Medtronic Azure XT DR MRI SureScan model [id number] (serial number [id number]) pacemaker.  The pocket was irrigated with copious gentamicin solution.  The pacemaker was then placed into the pocket.  The pocket was then closed in 2 layers with 2.0 Vicryl suture for the subcutaneous and subcuticular layers.  Steri- Strips and a sterile dressing were then applied. EBL<4ml. There were no early apparent complications. Permanent Pacemaker Indication: Documented non-reversible symptomatic bradycardia due to second degree and/or third degree atrioventricular block. During this procedure the patient is administered a total of Versed 2 mg and Fentanyl 25 mg to achieve and maintain moderate conscious sedation.  The patient's heart rate, blood pressure, and oxygen saturation are monitored continuously during the procedure. The period of conscious sedation is 72 minutes, of which I was present face-to-face 100% of this time.   CONCLUSIONS:  1. Successful implantation of a Medtronic Azure XT MRI conditional dual-chamber pacemaker for symptomatic  mobitz II second degree AV block  2. No early apparent complications.     Thompson Grayer, MD 07/10/2018 4:52 PM   ECHOCARDIOGRAM COMPLETE  Result Date: 07/10/2018                            [facility]*                   *  [address], [place] [address]                            [phone]  ------------------------------------------------------------------- Transthoracic Echocardiography Patient:    Emanie, Behan MR #:       [id number] Study Date: 07/10/2018 Gender:     F Age:        76 Height:     162.6 cm Weight:     76.7 kg BSA:        1.88 m^2 Pt. Status: Room:       [facility]  ADMITTING    Thompson Grayer, MD  ATTENDING    Thompson Grayer, MD  SONOGRAPHER  Johny Chess, RDCS, CCT  PERFORMING   [facility], Inpatient  ORDERING     Baldwin [place] cc: ------------------------------------------------------------------- LV EF: 55% -   60% ------------------------------------------------------------------- Indications:      Chest pain 786.51. ------------------------------------------------------------------- History:   PMH:  Pre pacemaker evaluation. ------------------------------------------------------------------- Study Conclusions - Left ventricle: The cavity size was normal. Systolic function was   normal. The estimated ejection fraction was in the range of 55%   to 60%. Wall motion was normal; there were no regional wall   motion abnormalities. Left ventricular diastolic function   parameters were normal. - Mitral valve: There was mild regurgitation. - Atrial septum: No defect or patent foramen ovale was identified. ------------------------------------------------------------------- Study data:  No prior study was available for comparison.  Study status:  Routine.  Procedure:  The patient reported no pain pre or post test. Transthoracic echocardiography. Image quality was good.         Transthoracic echocardiography.  M-mode, complete 2D, spectral Doppler, and color Doppler.  Birthdate:  Patient birthdate: 29-Sep-1951.  Age:  Patient is 69 yr old.  Sex:  Gender: female.    BMI: 29 kg/m^2.  Blood pressure:     184/81  Patient status:  Inpatient.  Study date:  Study date: 07/10/2018. Study time: 01:51 PM.  Location:  Bedside. -------------------------------------------------------------------  ------------------------------------------------------------------- Left ventricle:  The cavity size was normal. Systolic function was normal. The estimated ejection fraction was in the range of 55% to 60%. Wall motion was normal; there were no regional wall motion abnormalities. The transmitral flow pattern was normal. The deceleration time of the early transmitral flow velocity was normal. The pulmonary vein flow pattern was normal. The tissue Doppler parameters were normal. Left ventricular diastolic function parameters were normal. ------------------------------------------------------------------- Aortic valve:   Trileaflet; normal thickness leaflets. Mobility was not restricted.  Doppler:  Transvalvular velocity was within the normal range. There was no stenosis. There was no regurgitation.  ------------------------------------------------------------------- Aorta:  The aorta was normal, not dilated, and non-diseased. Aortic root: The aortic root was normal in size. ------------------------------------------------------------------- Mitral valve:   Structurally normal valve.   Mobility was not restricted.  Doppler:  Transvalvular velocity was within the normal range. There was no evidence for stenosis. There was mild regurgitation.    Peak gradient (D): 2 mm Hg. ------------------------------------------------------------------- Left atrium:  The atrium was normal in size. ------------------------------------------------------------------- Atrial septum:  No defect or patent foramen ovale was identified.  ------------------------------------------------------------------- Right ventricle:  The cavity  size was normal. Wall thickness was normal. Systolic function was normal. ------------------------------------------------------------------- Pulmonic valve:    Doppler:  Transvalvular velocity was within the normal range. There was no evidence for stenosis. There was mild regurgitation.  ------------------------------------------------------------------- Tricuspid valve:   Structurally normal valve.    Doppler: Transvalvular velocity was within the normal range. There was mild regurgitation. ------------------------------------------------------------------- Pulmonary artery:   The main pulmonary artery was normal-sized. Systolic pressure was within the normal range. ------------------------------------------------------------------- Right atrium:  The atrium was normal in size. ------------------------------------------------------------------- Pericardium:  The pericardium was normal in appearance. There was no pericardial effusion. ------------------------------------------------------------------- Systemic veins: Inferior vena cava: The vessel was normal in size. The respirophasic diameter changes were in the normal range (>= 50%), consistent with normal central venous pressure. ------------------------------------------------------------------- Post procedure conclusions Ascending Aorta: - The aorta was normal, not dilated, and non-diseased. ------------------------------------------------------------------- Measurements  Left ventricle                         Value        Reference  LV ID, ED, PLAX chordal                44.4  mm     43 - 52  LV ID, ES, PLAX chordal                29.2  mm     23 - 38  LV fx shortening, PLAX chordal         34    %      >=29  LV PW thickness, ED                    7.47  mm     ----------  IVS/LV PW ratio, ED                    1.1          <=1.3  Stroke volume, 2D                      99    ml     ----------  Stroke volume/bsa, 2D                  53    ml/m^2 ----------  LV e&', lateral                         8.61  cm/s   ----------  LV E/e&', lateral                       9.01         ----------  LV e&', medial                          7.54  cm/s   ----------  LV E/e&', medial                        10.29        ----------  LV e&', average                          8.08  cm/s   ----------  LV E/e&', average  9.61         ----------   Ventricular septum                     Value        Reference  IVS thickness, ED                      8.25  mm     ----------   LVOT                                   Value        Reference  LVOT ID, S                             21    mm     ----------  LVOT area                              3.46  cm^2   ----------  LVOT mean velocity, S                  82.8  cm/s   ----------  LVOT VTI, S                            28.7  cm     ----------   Aorta                                  Value        Reference  Aortic root ID, ED                     27    mm     ----------   Left atrium                            Value        Reference  LA ID, A-P, ES                         35    mm     ----------  LA ID/bsa, A-P                         1.86  cm/m^2 <=2.2  LA volume, S                           46.4  ml     ----------  LA volume/bsa, S                       24.6  ml/m^2 ----------  LA volume, ES, 1-p A4C                 38.6  ml     ----------  LA volume/bsa, ES, 1-p A4C             20.5  ml/m^2 ----------  LA volume, ES, 1-p A2C  52.6  ml     ----------  LA volume/bsa, ES, 1-p A2C             27.9  ml/m^2 ----------   Mitral valve                           Value        Reference  Mitral E-wave peak velocity            77.6  cm/s   ----------  Mitral A-wave peak velocity            74.2  cm/s   ----------  Mitral deceleration time       (H)     250   ms     150 - 230  Mitral peak gradient, D                2     mm Hg  ----------  Mitral E/A ratio, peak                 1            ----------   Pulmonary arteries                     Value        Reference  PA pressure, S, DP                     21    mm Hg  <=30   Tricuspid valve                        Value        Reference  Tricuspid regurg peak velocity         214   cm/s   ----------  Tricuspid peak RV-RA gradient          18    mm Hg  ----------   Right  atrium                           Value        Reference  RA ID, S-I, ES, A4C                    46.5  mm     34 - 49  RA area, ES, A4C                       11.7  cm^2   8.3 - 19.5  RA volume, ES, A/L                     26.1  ml     ----------  RA volume/bsa, ES, A/L                 13.9  ml/m^2 ----------   Systemic veins                         Value        Reference  Estimated CVP                          3     mm Hg  ----------   Right ventricle  Value        Reference  TAPSE                                  28.2  mm     ----------  RV pressure, S, DP                     21    mm Hg  <=30  RV s&', lateral, S                      15.7  cm/s   ---------- Legend: (L)  and  (H)  mark values outside specified reference range. ------------------------------------------------------------------- Prepared and Electronically Authenticated by Jenkins Rouge, M.D. 2019-08-01T13:25:30   Assessment & Plan:    Walker Kehr, MD

## 2020-11-08 NOTE — Assessment & Plan Note (Signed)
Pacemaker in place 2020

## 2020-11-08 NOTE — Assessment & Plan Note (Signed)
  We discussed age appropriate health related issues, including available/recomended screening tests and vaccinations. Labs were ordered to be later reviewed . All questions were answered. We discussed one or more of the following - seat belt use, use of sunscreen/sun exposure exercise, safe sex, fall risk reduction, second hand smoke exposure, firearm use and storage, seat belt use, a need for adhering to healthy diet and exercise. Labs were ordered.  All questions were answered. Colon 2018, due in 2023 mammo q 12 mo

## 2020-12-13 ENCOUNTER — Inpatient Hospital Stay: Admission: RE | Admit: 2020-12-13 | Payer: Medicare Other | Source: Ambulatory Visit

## 2020-12-29 ENCOUNTER — Other Ambulatory Visit: Payer: Self-pay

## 2020-12-29 ENCOUNTER — Encounter: Payer: Self-pay | Admitting: Nurse Practitioner

## 2020-12-29 ENCOUNTER — Ambulatory Visit (INDEPENDENT_AMBULATORY_CARE_PROVIDER_SITE_OTHER): Payer: Medicare Other | Admitting: Nurse Practitioner

## 2020-12-29 VITALS — BP 134/64 | HR 81 | Ht 64.0 in | Wt 177.4 lb

## 2020-12-29 DIAGNOSIS — I10 Essential (primary) hypertension: Secondary | ICD-10-CM

## 2020-12-29 DIAGNOSIS — I441 Atrioventricular block, second degree: Secondary | ICD-10-CM

## 2020-12-29 LAB — CUP PACEART INCLINIC DEVICE CHECK
Date Time Interrogation Session: 20220120091533
Implantable Lead Implant Date: 20190801
Implantable Lead Implant Date: 20190801
Implantable Lead Location: 753859
Implantable Lead Location: 753860
Implantable Lead Model: 3830
Implantable Lead Model: 5076
Implantable Pulse Generator Implant Date: 20190801

## 2020-12-29 NOTE — Progress Notes (Signed)
Electrophysiology Office Note Date: 12/29/2020  ID:  Liese, Dizdarevic March 10, 1951, MRN [id number]  PCP: Cassandria Anger, MD Primary Cardiologist: Ellyn Hack Electrophysiologist: Allred  CC: Pacemaker follow-up  Anna Ellis is a 70 y.o. female seen today for Dr Rayann Heman.  She presents today for routine electrophysiology followup.  Since last being seen in our clinic, the patient reports doing very well.  She denies chest pain, palpitations, dyspnea, PND, orthopnea, nausea, vomiting, dizziness, syncope, edema, weight gain, or early satiety.  Device History: MDT dual chamber (HIS lead) PPM implanted 2019 for Mobitz II    Past Medical History:  Diagnosis Date  . ALLERGIC RHINITIS 09/19/2007  . ANEMIA-IRON DEFICIENCY 09/19/2007  . ANXIETY 09/19/2007  . Arthritis    "neck" (07/10/2018)  . Basal cell cancer    "back of my neck; scraped it off"  . CAROTID BRUIT 01/08/2011  . GERD (gastroesophageal reflux disease)   . Hyperlipidemia   . Mobitz II    a. s/p MDT dual chamber (His Bundle) pacemaker 07/2018 - Dr Rayann Heman  . OSTEOARTHRITIS 09/19/2007   Past Surgical History:  Procedure Laterality Date  . ANTERIOR CRUCIATE LIGAMENT REPAIR Left 2001   "donor ACL"  . BREAST LUMPECTOMY Bilateral 2002   "both benign"  . INGUINAL HERNIA REPAIR Right 1971  . KNEE CARTILAGE SURGERY Left 1986   w/ACL repair  . LAPAROSCOPIC CHOLECYSTECTOMY  1994  . LEFT HEART CATH AND CORONARY ANGIOGRAPHY N/A 07/08/2018   Procedure: LEFT HEART CATH AND CORONARY ANGIOGRAPHY;  Surgeon: Leonie Man, MD;  Location: [facility];  Service: Cardiovascular;; Angiographically normal coronary arteries except ~20-25% prox & mid LAD.  Normal LVEDP.  Marland Kitchen PACEMAKER IMPLANT N/A 07/10/2018   Medtronic Azure XT DR MRI SureScan model [id number] (serial number [id number]) pacemaker by Dr Rayann Heman with His bundle lead  . [place]  . TONSILLECTOMY AND ADENOIDECTOMY  1968  . TUBAL LIGATION  1982  . VAGINAL  HYSTERECTOMY  1990  . WISDOM TOOTH EXTRACTION  1967    Current Outpatient Medications  Medication Sig Dispense Refill  . acetaminophen (TYLENOL) 500 MG tablet Take 1,000 mg by mouth every 6 (six) hours as needed (for pain.).    Marland Kitchen ALPRAZolam (XANAX) 0.25 MG tablet Take 1 tablet (0.25 mg total) by mouth 2 (two) times daily as needed. 60 tablet 1  . aspirin EC 81 MG tablet Take 81 mg by mouth daily.    Marland Kitchen aspirin-acetaminophen-caffeine (EXCEDRIN MIGRAINE) 250-250-65 MG tablet Take 2 tablets by mouth every 6 (six) hours as needed for headache.    Marland Kitchen atorvastatin (LIPITOR) 20 MG tablet Take 0.5 tablets (10 mg total) by mouth daily. 1/2 tablet(10mg ) daily. Insurance will only pay for 20mg  tablet. 90 tablet 3  . cefUROXime (CEFTIN) 500 MG tablet TAKE 1 TABLET BY MOUTH TWICE A DAY 20 tablet 0  . Cholecalciferol (VITAMIN D3) 2000 units TABS Take 2,000 Units by mouth daily.     Marland Kitchen ELDERBERRY PO Take by mouth daily.    Marland Kitchen etodolac (LODINE) 500 MG tablet TAKE 1 TABLET TWICE A DAY AS NEEDED WITH FOOD 180 tablet 3  . losartan (COZAAR) 50 MG tablet Take 1 tablet (50 mg total) by mouth daily. (Patient taking differently: Take 25 mg by mouth daily. TAKE ONE HALF TABLET (25MG ) BY MOUTH DAILY.) 90 tablet 3  . methylPREDNISolone (MEDROL DOSEPAK) 4 MG TBPK tablet TAKE 6 TABLETS ON DAY 1 AS DIRECTED ON PACKAGE AND DECREASE BY 1 TAB EACH  DAY FOR A TOTAL OF 6 DAYS 21 each 0  . metroNIDAZOLE (METROCREAM) 0.75 % cream Apply 1 application topically daily as needed (for rosacea). 45 g 3  . mometasone (NASONEX) 50 MCG/ACT nasal spray Place 2 sprays into the nose daily. 17 g 3  . montelukast (SINGULAIR) 10 MG tablet Take 1 tablet (10 mg total) by mouth daily. 90 tablet 3  . mupirocin ointment (BACTROBAN) 2 % Use bid 30 g 1  . omeprazole (PRILOSEC) 20 MG capsule Take 1 capsule (20 mg total) by mouth 2 (two) times daily before a meal. White caps please 180 capsule 3  . ondansetron (ZOFRAN) 4 MG tablet Take 1 tablet (4 mg total)  by mouth every 8 (eight) hours as needed for nausea or vomiting. 20 tablet 0   No current facility-administered medications for this visit.    Allergies:   Clarithromycin, Food color red, Sulfonamide derivatives, Cobalt, and Parabens   Social History: Social History   Socioeconomic History  . Marital status: Married    Spouse name: Not on file  . Number of children: 3  . Years of education: Not on file  . Highest education level: High school graduate  Occupational History  . Occupation: Retired    Fish farm manager: RETIRED    Comment: Web designer  Tobacco Use  . Smoking status: Never Smoker  . Smokeless tobacco: Never Used  Vaping Use  . Vaping Use: Never used  Substance and Sexual Activity  . Alcohol use: Never  . Drug use: Never  . Sexual activity: Yes  Other Topics Concern  . Not on file  Social History Narrative   She is a married (49 years).  They have 3 children and 5 grandchildren.  She is a retired Financial trader for [employer].   She regularly exercises walking and using stairs etc.   Social Determinants of Health   Financial Resource Strain: Not on file  Food Insecurity: Not on file  Transportation Needs: Not on file  Physical Activity: Not on file  Stress: Not on file  Social Connections: Not on file  Intimate Partner Violence: Not on file    Family History: Family History  Problem Relation Age of Onset  . Breast cancer Mother        breast  . Heart disease Father        h/o Rheumatic Fever - Rheumatic MV Dz.  . Allergies Son   . Asthma Son   . Tuberculosis Maternal Grandmother        died in her 18s  . Throat cancer Maternal Grandfather   . Pneumonia Paternal Grandmother      Review of Systems: All other systems reviewed and are otherwise negative except as noted above.   Physical Exam: VS:  BP 134/64   Pulse 81   Ht 5\' 4"  (1.626 m)   Wt 177 lb 6.4 oz (80.5 kg)   SpO2 96%   BMI 30.45 kg/m  , BMI Body mass  index is 30.45 kg/m.  GEN- The patient is well appearing, alert and oriented x 3 today.   HEENT: normocephalic, atraumatic; sclera clear, conjunctiva pink; hearing intact; oropharynx clear; neck supple  Lungs- Clear to ausculation bilaterally, normal work of breathing.  No wheezes, rales, rhonchi Heart- Regular rate and rhythm  GI- soft, non-tender, non-distended, bowel sounds present  Extremities- no clubbing, cyanosis, or edema  MS- no significant deformity or atrophy Skin- warm and dry, no rash or lesion; PPM pocket well healed Psych- euthymic  mood, full affect Neuro- strength and sensation are intact  PPM Interrogation- reviewed in detail today,  See PACEART report  EKG:  EKG is not ordered today.  Recent Labs: 11/08/2020: ALT 17; BUN 18; Creatinine, Ser 0.69; Hemoglobin 14.5; Platelets 342.0; Potassium 3.9; Sodium 140; TSH 2.22   Wt Readings from Last 3 Encounters:  12/29/20 177 lb 6.4 oz (80.5 kg)  11/08/20 174 lb 9.6 oz (79.2 kg)  05/26/20 175 lb (79.4 kg)     Other studies Reviewed: Additional studies/ records that were reviewed today include: Renee's office notes  Assessment and Plan:  1.  Mobitz II  Normal PPM function See Pace Art report No changes today  2.  HTN Stable No change required today    Current medicines are reviewed at length with the patient today.   The patient does not have concerns regarding her medicines.  The following changes were made today:  none  Labs/ tests ordered today include: none No orders of the defined types were placed in this encounter.    Disposition:   Follow up with [facility], me 1 year    Signed, Chanetta Marshall, NP 12/29/2020 9:14 AM  [address] [place] [address] [phone] (office) [phone] (fax)

## 2020-12-29 NOTE — Patient Instructions (Signed)
Medication Instructions:  *If you need a refill on your cardiac medications before your next appointment, please call your pharmacy*  Follow-Up: At [facility], you and your health needs are our priority.  As part of our continuing mission to provide you with exceptional heart care, we have created designated Provider Care Teams.  These Care Teams include your primary Cardiologist (physician) and Advanced Practice Providers (APPs -  Physician Assistants and Nurse Practitioners) who all work together to provide you with the care you need, when you need it.  We recommend signing up for the patient portal called "MyChart".  Sign up information is provided on this After Visit Summary.  MyChart is used to connect with patients for Virtual Visits (Telemedicine).  Patients are able to view lab/test results, encounter notes, upcoming appointments, etc.  Non-urgent messages can be sent to your provider as well.   To learn more about what you can do with MyChart, go to [email or web address].    Your next appointment:   Your physician wants you to follow-up in: 1 YEAR with Chanetta Marshall, NP. You will receive a reminder letter in the mail two months in advance. If you don't receive a letter, please call our office to schedule the follow-up appointment.  Remote monitoring is used to monitor your Pacemaker from home. This monitoring reduces the number of office visits required to check your device to one time per year. It allows [place] to keep an eye on the functioning of your device to ensure it is working properly. You are scheduled for a device check from home on 01/19/21. You may send your transmission at any time that day. If you have a wireless device, the transmission will be sent automatically. After your physician reviews your transmission, you will receive a postcard with your next transmission date.  The format for your next appointment:   In Person with Chanetta Marshall, NP

## 2021-01-19 ENCOUNTER — Ambulatory Visit (INDEPENDENT_AMBULATORY_CARE_PROVIDER_SITE_OTHER): Payer: Medicare Other

## 2021-01-19 DIAGNOSIS — I441 Atrioventricular block, second degree: Secondary | ICD-10-CM | POA: Diagnosis not present

## 2021-01-19 LAB — CUP PACEART REMOTE DEVICE CHECK
Battery Remaining Longevity: 108 mo
Battery Voltage: 3 V
Brady Statistic AP VP Percent: 34.95 %
Brady Statistic AP VS Percent: 0 %
Brady Statistic AS VP Percent: 64.88 %
Brady Statistic AS VS Percent: 0.17 %
Brady Statistic RA Percent Paced: 34.96 %
Brady Statistic RV Percent Paced: 99.83 %
Date Time Interrogation Session: 20220209234639
Implantable Lead Implant Date: 20190801
Implantable Lead Implant Date: 20190801
Implantable Lead Location: 753859
Implantable Lead Location: 753860
Implantable Lead Model: 3830
Implantable Lead Model: 5076
Implantable Pulse Generator Implant Date: 20190801
Lead Channel Impedance Value: 342 Ohm
Lead Channel Impedance Value: 361 Ohm
Lead Channel Impedance Value: 418 Ohm
Lead Channel Impedance Value: 437 Ohm
Lead Channel Pacing Threshold Amplitude: 0.75 V
Lead Channel Pacing Threshold Amplitude: 1 V
Lead Channel Pacing Threshold Pulse Width: 0.4 ms
Lead Channel Pacing Threshold Pulse Width: 0.4 ms
Lead Channel Sensing Intrinsic Amplitude: 2.875 mV
Lead Channel Sensing Intrinsic Amplitude: 2.875 mV
Lead Channel Sensing Intrinsic Amplitude: 4.75 mV
Lead Channel Sensing Intrinsic Amplitude: 4.75 mV
Lead Channel Setting Pacing Amplitude: 2 V
Lead Channel Setting Pacing Amplitude: 2.5 V
Lead Channel Setting Pacing Pulse Width: 0.4 ms
Lead Channel Setting Sensing Sensitivity: 1.2 mV

## 2021-01-26 NOTE — Progress Notes (Signed)
Remote pacemaker transmission.   

## 2021-03-24 ENCOUNTER — Other Ambulatory Visit: Payer: Self-pay | Admitting: Internal Medicine

## 2021-04-20 ENCOUNTER — Ambulatory Visit (INDEPENDENT_AMBULATORY_CARE_PROVIDER_SITE_OTHER): Payer: Medicare Other

## 2021-04-20 DIAGNOSIS — I441 Atrioventricular block, second degree: Secondary | ICD-10-CM | POA: Diagnosis not present

## 2021-04-20 LAB — CUP PACEART REMOTE DEVICE CHECK
Battery Remaining Longevity: 106 mo
Battery Voltage: 2.99 V
Brady Statistic AP VP Percent: 36.87 %
Brady Statistic AP VS Percent: 0 %
Brady Statistic AS VP Percent: 62.95 %
Brady Statistic AS VS Percent: 0.18 %
Brady Statistic RA Percent Paced: 36.89 %
Brady Statistic RV Percent Paced: 99.82 %
Date Time Interrogation Session: 20220511192044
Implantable Lead Implant Date: 20190801
Implantable Lead Implant Date: 20190801
Implantable Lead Location: 753859
Implantable Lead Location: 753860
Implantable Lead Model: 3830
Implantable Lead Model: 5076
Implantable Pulse Generator Implant Date: 20190801
Lead Channel Impedance Value: 361 Ohm
Lead Channel Impedance Value: 380 Ohm
Lead Channel Impedance Value: 418 Ohm
Lead Channel Impedance Value: 456 Ohm
Lead Channel Pacing Threshold Amplitude: 0.625 V
Lead Channel Pacing Threshold Amplitude: 0.75 V
Lead Channel Pacing Threshold Pulse Width: 0.4 ms
Lead Channel Pacing Threshold Pulse Width: 0.4 ms
Lead Channel Sensing Intrinsic Amplitude: 10.875 mV
Lead Channel Sensing Intrinsic Amplitude: 10.875 mV
Lead Channel Sensing Intrinsic Amplitude: 4 mV
Lead Channel Sensing Intrinsic Amplitude: 4 mV
Lead Channel Setting Pacing Amplitude: 2 V
Lead Channel Setting Pacing Amplitude: 2.5 V
Lead Channel Setting Pacing Pulse Width: 0.4 ms
Lead Channel Setting Sensing Sensitivity: 1.2 mV

## 2021-04-26 NOTE — Telephone Encounter (Signed)
Please advise 

## 2021-04-29 ENCOUNTER — Other Ambulatory Visit: Payer: Self-pay | Admitting: Internal Medicine

## 2021-04-29 MED ORDER — CEFUROXIME AXETIL 500 MG PO TABS
500.0000 mg | ORAL_TABLET | Freq: Two times a day (BID) | ORAL | 0 refills | Status: DC
Start: 2021-04-29 — End: 2022-01-03

## 2021-04-29 MED ORDER — METHYLPREDNISOLONE 4 MG PO TBPK
ORAL_TABLET | ORAL | 0 refills | Status: DC
Start: 2021-04-29 — End: 2022-01-03

## 2021-05-01 ENCOUNTER — Ambulatory Visit (INDEPENDENT_AMBULATORY_CARE_PROVIDER_SITE_OTHER)
Admission: RE | Admit: 2021-05-01 | Discharge: 2021-05-01 | Disposition: A | Discharge status: Home / Self Care | Payer: Medicare Other | Source: Ambulatory Visit | Attending: Internal Medicine | Admitting: Internal Medicine

## 2021-05-01 ENCOUNTER — Other Ambulatory Visit: Payer: Self-pay

## 2021-05-01 DIAGNOSIS — N959 Unspecified menopausal and perimenopausal disorder: Secondary | ICD-10-CM | POA: Diagnosis not present

## 2021-05-12 NOTE — Progress Notes (Signed)
Remote pacemaker transmission.   

## 2021-07-20 ENCOUNTER — Ambulatory Visit (INDEPENDENT_AMBULATORY_CARE_PROVIDER_SITE_OTHER): Payer: Medicare Other

## 2021-07-20 DIAGNOSIS — I441 Atrioventricular block, second degree: Secondary | ICD-10-CM

## 2021-07-20 LAB — CUP PACEART REMOTE DEVICE CHECK
Battery Remaining Longevity: 99 mo
Battery Voltage: 2.99 V
Brady Statistic AP VP Percent: 36.6 %
Brady Statistic AP VS Percent: 0 %
Brady Statistic AS VP Percent: 63.18 %
Brady Statistic AS VS Percent: 0.22 %
Brady Statistic RA Percent Paced: 36.63 %
Brady Statistic RV Percent Paced: 99.78 %
Date Time Interrogation Session: 20220810211341
Implantable Lead Implant Date: 20190801
Implantable Lead Implant Date: 20190801
Implantable Lead Location: 753859
Implantable Lead Location: 753860
Implantable Lead Model: 3830
Implantable Lead Model: 5076
Implantable Pulse Generator Implant Date: 20190801
Lead Channel Impedance Value: 323 Ohm
Lead Channel Impedance Value: 342 Ohm
Lead Channel Impedance Value: 380 Ohm
Lead Channel Impedance Value: 418 Ohm
Lead Channel Pacing Threshold Amplitude: 0.625 V
Lead Channel Pacing Threshold Amplitude: 0.875 V
Lead Channel Pacing Threshold Pulse Width: 0.4 ms
Lead Channel Pacing Threshold Pulse Width: 0.4 ms
Lead Channel Sensing Intrinsic Amplitude: 3.5 mV
Lead Channel Sensing Intrinsic Amplitude: 3.5 mV
Lead Channel Sensing Intrinsic Amplitude: 9 mV
Lead Channel Sensing Intrinsic Amplitude: 9 mV
Lead Channel Setting Pacing Amplitude: 2 V
Lead Channel Setting Pacing Amplitude: 2.5 V
Lead Channel Setting Pacing Pulse Width: 0.4 ms
Lead Channel Setting Sensing Sensitivity: 1.2 mV

## 2021-08-11 NOTE — Progress Notes (Signed)
Remote pacemaker transmission.   

## 2021-10-19 ENCOUNTER — Ambulatory Visit (INDEPENDENT_AMBULATORY_CARE_PROVIDER_SITE_OTHER): Payer: Medicare Other

## 2021-10-19 DIAGNOSIS — I441 Atrioventricular block, second degree: Secondary | ICD-10-CM

## 2021-10-19 LAB — CUP PACEART REMOTE DEVICE CHECK
Battery Remaining Longevity: 96 mo
Battery Voltage: 2.99 V
Brady Statistic AP VP Percent: 34.28 %
Brady Statistic AP VS Percent: 0 %
Brady Statistic AS VP Percent: 65.54 %
Brady Statistic AS VS Percent: 0.18 %
Brady Statistic RA Percent Paced: 34.3 %
Brady Statistic RV Percent Paced: 99.81 %
Date Time Interrogation Session: 20221109230929
Implantable Lead Implant Date: 20190801
Implantable Lead Implant Date: 20190801
Implantable Lead Location: 753859
Implantable Lead Location: 753860
Implantable Lead Model: 3830
Implantable Lead Model: 5076
Implantable Pulse Generator Implant Date: 20190801
Lead Channel Impedance Value: 323 Ohm
Lead Channel Impedance Value: 342 Ohm
Lead Channel Impedance Value: 380 Ohm
Lead Channel Impedance Value: 418 Ohm
Lead Channel Pacing Threshold Amplitude: 0.625 V
Lead Channel Pacing Threshold Amplitude: 0.875 V
Lead Channel Pacing Threshold Pulse Width: 0.4 ms
Lead Channel Pacing Threshold Pulse Width: 0.4 ms
Lead Channel Sensing Intrinsic Amplitude: 4.75 mV
Lead Channel Sensing Intrinsic Amplitude: 4.75 mV
Lead Channel Sensing Intrinsic Amplitude: 9.75 mV
Lead Channel Sensing Intrinsic Amplitude: 9.75 mV
Lead Channel Setting Pacing Amplitude: 2 V
Lead Channel Setting Pacing Amplitude: 2.5 V
Lead Channel Setting Pacing Pulse Width: 0.4 ms
Lead Channel Setting Sensing Sensitivity: 1.2 mV

## 2021-10-30 NOTE — Progress Notes (Signed)
Remote pacemaker transmission.   

## 2021-12-13 ENCOUNTER — Other Ambulatory Visit: Payer: Self-pay | Admitting: Internal Medicine

## 2021-12-27 ENCOUNTER — Other Ambulatory Visit: Payer: Self-pay

## 2021-12-27 ENCOUNTER — Encounter (HOSPITAL_BASED_OUTPATIENT_CLINIC_OR_DEPARTMENT_OTHER): Payer: Self-pay | Admitting: Internal Medicine

## 2021-12-27 ENCOUNTER — Ambulatory Visit (INDEPENDENT_AMBULATORY_CARE_PROVIDER_SITE_OTHER): Payer: Medicare Other | Admitting: Internal Medicine

## 2021-12-27 VITALS — BP 146/86 | HR 101 | Ht 64.0 in | Wt 179.2 lb

## 2021-12-27 DIAGNOSIS — I10 Essential (primary) hypertension: Secondary | ICD-10-CM | POA: Diagnosis not present

## 2021-12-27 DIAGNOSIS — Z950 Presence of cardiac pacemaker: Secondary | ICD-10-CM | POA: Diagnosis not present

## 2021-12-27 DIAGNOSIS — I441 Atrioventricular block, second degree: Secondary | ICD-10-CM | POA: Diagnosis not present

## 2021-12-27 LAB — CUP PACEART INCLINIC DEVICE CHECK
Battery Remaining Longevity: 95 mo
Battery Voltage: 2.98 V
Brady Statistic AP VP Percent: 35.65 %
Brady Statistic AP VS Percent: 0 %
Brady Statistic AS VP Percent: 64.17 %
Brady Statistic AS VS Percent: 0.18 %
Brady Statistic RA Percent Paced: 35.67 %
Brady Statistic RV Percent Paced: 99.82 %
Date Time Interrogation Session: 20230118110558
Implantable Lead Implant Date: 20190801
Implantable Lead Implant Date: 20190801
Implantable Lead Location: 753859
Implantable Lead Location: 753860
Implantable Lead Model: 3830
Implantable Lead Model: 5076
Implantable Pulse Generator Implant Date: 20190801
Lead Channel Impedance Value: 342 Ohm
Lead Channel Impedance Value: 361 Ohm
Lead Channel Impedance Value: 399 Ohm
Lead Channel Impedance Value: 456 Ohm
Lead Channel Pacing Threshold Amplitude: 0.75 V
Lead Channel Pacing Threshold Amplitude: 0.75 V
Lead Channel Pacing Threshold Pulse Width: 0.4 ms
Lead Channel Pacing Threshold Pulse Width: 0.4 ms
Lead Channel Sensing Intrinsic Amplitude: 5 mV
Lead Channel Sensing Intrinsic Amplitude: 5.375 mV
Lead Channel Setting Pacing Amplitude: 2 V
Lead Channel Setting Pacing Amplitude: 2.5 V
Lead Channel Setting Pacing Pulse Width: 0.4 ms
Lead Channel Setting Sensing Sensitivity: 1.2 mV

## 2021-12-27 NOTE — Progress Notes (Signed)
PCP: Cassandria Anger, MD Primary Cardiologist: Dr Ellyn Hack Primary EP:  Dr Rayann Heman  Anna Ellis is a 71 y.o. female who presents today for routine electrophysiology followup.  Since last being seen in our clinic, the patient reports doing very well.  Today, she denies symptoms of palpitations, chest pain, shortness of breath,  lower extremity edema, dizziness, presyncope, or syncope.  The patient is otherwise without complaint today.   Past Medical History:  Diagnosis Date   ALLERGIC RHINITIS 09/19/2007   ANEMIA-IRON DEFICIENCY 09/19/2007   ANXIETY 09/19/2007   Arthritis    "neck" (07/10/2018)   Basal cell cancer    "back of my neck; scraped it off"   CAROTID BRUIT 01/08/2011   GERD (gastroesophageal reflux disease)    Hyperlipidemia    Mobitz II    a. s/p MDT dual chamber (His Bundle) pacemaker 07/2018 - Dr Rayann Heman   OSTEOARTHRITIS 09/19/2007   Past Surgical History:  Procedure Laterality Date   ANTERIOR CRUCIATE LIGAMENT REPAIR Left 2001   "donor ACL"   BREAST LUMPECTOMY Bilateral 2002   "both benign"   INGUINAL HERNIA REPAIR Right [place] Left 1986   w/ACL repair   LAPAROSCOPIC CHOLECYSTECTOMY  1994   LEFT HEART CATH AND CORONARY ANGIOGRAPHY N/A 07/08/2018   Procedure: LEFT HEART CATH AND CORONARY ANGIOGRAPHY;  Surgeon: Leonie Man, MD;  Location: [facility];  Service: Cardiovascular;; Angiographically normal coronary arteries except ~20-25% prox & mid LAD.  Normal LVEDP.   PACEMAKER IMPLANT N/A 07/10/2018   Medtronic Azure XT DR MRI SureScan model [id number] (serial number [id number]) pacemaker by Dr Rayann Heman with His bundle lead   TEAR DUCT [place]- all systems are reviewed and negative except as per HPI above  Current Outpatient Medications  Medication Sig Dispense Refill   acetaminophen  (TYLENOL) 500 MG tablet Take 1,000 mg by mouth every 6 (six) hours as needed (for pain.).     ALPRAZolam (XANAX) 0.25 MG tablet Take 1 tablet (0.25 mg total) by mouth 2 (two) times daily as needed. 60 tablet 1   aspirin EC 81 MG tablet Take 81 mg by mouth daily.     aspirin-acetaminophen-caffeine (EXCEDRIN MIGRAINE) 250-250-65 MG tablet Take 2 tablets by mouth every 6 (six) hours as needed for headache.     atorvastatin (LIPITOR) 20 MG tablet Take 0.5 tablets (10 mg total) by mouth daily. 1/2 tablet(10mg ) daily. Insurance will only pay for 20mg  tablet. 90 tablet 3   cefUROXime (CEFTIN) 500 MG tablet Take 1 tablet (500 mg total) by mouth 2 (two) times daily. 20 tablet 0   Cholecalciferol (VITAMIN D3) 2000 units TABS Take 2,000 Units by mouth daily.      ELDERBERRY PO Take by mouth daily.     etodolac (LODINE) 500 MG tablet TAKE 1 TABLET TWICE A DAY AS NEEDED WITH FOOD 180 tablet 3   losartan (COZAAR) 50 MG tablet TAKE 1 TABLET BY MOUTH EVERY DAY 90 tablet 2   methylPREDNISolone (MEDROL DOSEPAK) 4 MG TBPK tablet TAKE 6 TABLETS ON DAY 1 AS DIRECTED ON PACKAGE AND DECREASE BY 1 TAB EACH DAY FOR A TOTAL OF 6 DAYS 21 each 0   metroNIDAZOLE (METROCREAM) 0.75 % cream Apply 1 application topically daily as needed (for rosacea). 45 g 3  mometasone (NASONEX) 50 MCG/ACT nasal spray Place 2 sprays into the nose daily. 17 g 3   mupirocin ointment (BACTROBAN) 2 % Use bid 30 g 1   omeprazole (PRILOSEC) 20 MG capsule Take 1 capsule (20 mg total) by mouth 2 (two) times daily before a meal. White caps please 180 capsule 3   ondansetron (ZOFRAN) 4 MG tablet Take 1 tablet (4 mg total) by mouth every 8 (eight) hours as needed for nausea or vomiting. 20 tablet 0   montelukast (SINGULAIR) 10 MG tablet Take 1 tablet (10 mg total) by mouth daily. 90 tablet 3   No current facility-administered medications for this visit.    Physical Exam: Vitals:   12/27/21 1036  BP: (!) 146/86  Pulse: (!) 101  SpO2: 97%  Weight:  179 lb 3.2 oz (81.3 kg)  Height: 5\' 4"  (1.626 m)    GEN- The patient is well appearing, alert and oriented x 3 today.   Head- normocephalic, atraumatic Eyes-  Sclera clear, conjunctiva pink Ears- hearing intact Oropharynx- clear Lungs- Clear to ausculation bilaterally, normal work of breathing Chest- pacemaker pocket is well healed Heart- Regular rate and rhythm, no murmurs, rubs or gallops, PMI not laterally displaced GI- soft, NT, ND, + BS Extremities- no clubbing, cyanosis, or edema  Pacemaker interrogation- reviewed in detail today,  See PACEART report  ekg tracing ordered today is personally reviewed and shows sinus tachycardia with V pacing (ekg obtained immediately upon entering exam room)  Assessment and Plan:  1. Symptomatic second degree heart block Normal pacemaker function See Pace Art report No changes today she is not device dependant today  2. HTN Stable No change equired today  Return in a year to see EP APP  Thompson Grayer MD, [facility] 12/27/2021 10:44 AM

## 2021-12-27 NOTE — Patient Instructions (Addendum)
Medication Instructions:  Your physician recommends that you continue on your current medications as directed. Please refer to the Current Medication list given to you today. *If you need a refill on your cardiac medications before your next appointment, please call your pharmacy*  Lab Work: None ordered. If you have labs (blood work) drawn today and your tests are completely normal, you will receive your results only by: [place] (if you have MyChart) OR A paper copy in the mail If you have any lab test that is abnormal or we need to change your treatment, we will call you to review the results.  Testing/Procedures: None ordered.  Follow-Up: At [facility], you and your health needs are our priority.  As part of our continuing mission to provide you with exceptional heart care, we have created designated Provider Care Teams.  These Care Teams include your primary Cardiologist (physician) and Advanced Practice Providers (APPs -  Physician Assistants and Nurse Practitioners) who all work together to provide you with the care you need, when you need it.  Your next appointment:   Your physician wants you to follow-up in: one year with Tommye Standard, PA-C You will receive a reminder letter in the mail two months in advance. If you don't receive a letter, please call our office to schedule the follow-up appointment.  Remote monitoring is used to monitor your Pacemaker from home. This monitoring reduces the number of office visits required to check your device to one time per year. It allows [place] to keep an eye on the functioning of your device to ensure it is working properly. You are scheduled for a device check from home on 01/18/2022. You may send your transmission at any time that day. If you have a wireless device, the transmission will be sent automatically. After your physician reviews your transmission, you will receive a postcard with your next transmission date.

## 2022-01-01 ENCOUNTER — Encounter: Payer: Medicare Other | Admitting: Physician Assistant

## 2022-01-02 ENCOUNTER — Encounter: Payer: Medicare Other | Admitting: Internal Medicine

## 2022-01-03 ENCOUNTER — Other Ambulatory Visit: Payer: Self-pay

## 2022-01-03 ENCOUNTER — Ambulatory Visit (INDEPENDENT_AMBULATORY_CARE_PROVIDER_SITE_OTHER): Payer: Medicare Other | Admitting: Internal Medicine

## 2022-01-03 ENCOUNTER — Encounter: Payer: Self-pay | Admitting: Internal Medicine

## 2022-01-03 VITALS — BP 138/86 | HR 72 | Temp 98.3°F | Ht 64.0 in | Wt 179.0 lb

## 2022-01-03 DIAGNOSIS — Z9102 Food additives allergy status: Secondary | ICD-10-CM | POA: Diagnosis not present

## 2022-01-03 DIAGNOSIS — M8580 Other specified disorders of bone density and structure, unspecified site: Secondary | ICD-10-CM

## 2022-01-03 DIAGNOSIS — F419 Anxiety disorder, unspecified: Secondary | ICD-10-CM

## 2022-01-03 DIAGNOSIS — J0140 Acute pansinusitis, unspecified: Secondary | ICD-10-CM | POA: Diagnosis not present

## 2022-01-03 DIAGNOSIS — Z0000 Encounter for general adult medical examination without abnormal findings: Secondary | ICD-10-CM | POA: Diagnosis not present

## 2022-01-03 LAB — COMPREHENSIVE METABOLIC PANEL
ALT: 17 U/L (ref 0–35)
AST: 22 U/L (ref 0–37)
Albumin: 4.3 g/dL (ref 3.5–5.2)
Alkaline Phosphatase: 59 U/L (ref 39–117)
BUN: 16 mg/dL (ref 6–23)
CO2: 28 mEq/L (ref 19–32)
Calcium: 9.5 mg/dL (ref 8.4–10.5)
Chloride: 104 mEq/L (ref 96–112)
Creatinine, Ser: 0.7 mg/dL (ref 0.40–1.20)
GFR: 86.33 mL/min (ref >60.00)
Glucose, Bld: 90 mg/dL (ref 70–99)
Potassium: 4.1 mEq/L (ref 3.5–5.1)
Sodium: 141 mEq/L (ref 135–145)
Total Bilirubin: 0.6 mg/dL (ref 0.2–1.2)
Total Protein: 6.7 g/dL (ref 6.0–8.3)

## 2022-01-03 LAB — URINALYSIS
Bilirubin Urine: NEGATIVE
Hgb urine dipstick: NEGATIVE
Ketones, ur: NEGATIVE
Leukocytes,Ua: NEGATIVE
Nitrite: NEGATIVE
Specific Gravity, Urine: <=1.005 — AB (ref 1.000–1.030)
Total Protein, Urine: NEGATIVE
Urine Glucose: NEGATIVE
Urobilinogen, UA: 0.2 (ref 0.0–1.0)
pH: 6.5 (ref 5.0–8.0)

## 2022-01-03 LAB — CBC WITH DIFFERENTIAL/PLATELET
Basophils Absolute: 0 10*3/uL (ref 0.0–0.1)
Basophils Relative: 1 % (ref 0.0–3.0)
Eosinophils Absolute: 0.2 10*3/uL (ref 0.0–0.7)
Eosinophils Relative: 5.1 % — ABNORMAL HIGH (ref 0.0–5.0)
HCT: 40.7 % (ref 36.0–46.0)
Hemoglobin: 13.7 g/dL (ref 12.0–15.0)
Lymphocytes Relative: 27.7 % (ref 12.0–46.0)
Lymphs Abs: 1.2 10*3/uL (ref 0.7–4.0)
MCHC: 33.7 g/dL (ref 30.0–36.0)
MCV: 95.2 fl (ref 78.0–100.0)
Monocytes Absolute: 0.5 10*3/uL (ref 0.1–1.0)
Monocytes Relative: 11.4 % (ref 3.0–12.0)
Neutro Abs: 2.3 10*3/uL (ref 1.4–7.7)
Neutrophils Relative %: 54.8 % (ref 43.0–77.0)
Platelets: 330 10*3/uL (ref 150.0–400.0)
RBC: 4.28 Mil/uL (ref 3.87–5.11)
RDW: 12.8 % (ref 11.5–15.5)
WBC: 4.2 10*3/uL (ref 4.0–10.5)

## 2022-01-03 LAB — LIPID PANEL
Cholesterol: 158 mg/dL (ref 0–200)
HDL: 54.1 mg/dL (ref >39.00)
LDL Cholesterol: 85 mg/dL (ref 0–99)
NonHDL: 103.81
Total CHOL/HDL Ratio: 3
Triglycerides: 95 mg/dL (ref 0.0–149.0)
VLDL: 19 mg/dL (ref 0.0–40.0)

## 2022-01-03 LAB — TSH: TSH: 2.69 u[IU]/mL (ref 0.35–5.50)

## 2022-01-03 MED ORDER — CEFUROXIME AXETIL 500 MG PO TABS: 500.0000 mg | ORAL_TABLET | Freq: Two times a day (BID) | ORAL | 0 refills | Status: DC

## 2022-01-03 MED ORDER — METHYLPREDNISOLONE 4 MG PO TBPK: ORAL_TABLET | ORAL | 0 refills | Status: DC

## 2022-01-03 MED ORDER — OMEPRAZOLE 20 MG PO CPDR: 20.0000 mg | DELAYED_RELEASE_CAPSULE | Freq: Two times a day (BID) | ORAL | 3 refills | Status: DC

## 2022-01-03 MED ORDER — ATORVASTATIN CALCIUM 10 MG PO TABS: 10.0000 mg | ORAL_TABLET | Freq: Every day | ORAL | 3 refills | Status: DC

## 2022-01-03 MED ORDER — RISEDRONATE SODIUM 35 MG PO TABS: 35.0000 mg | ORAL_TABLET | ORAL | 11 refills | Status: DC

## 2022-01-03 MED ORDER — ETODOLAC 500 MG PO TABS: ORAL_TABLET | ORAL | 3 refills | Status: DC

## 2022-01-03 MED ORDER — MONTELUKAST SODIUM 10 MG PO TABS: 10.0000 mg | ORAL_TABLET | Freq: Every day | ORAL | 3 refills | Status: DC

## 2022-01-03 MED ORDER — ALPRAZOLAM 0.25 MG PO TABS: 0.2500 mg | ORAL_TABLET | Freq: Two times a day (BID) | ORAL | 1 refills | Status: DC | PRN

## 2022-01-03 MED ORDER — LOSARTAN POTASSIUM 50 MG PO TABS: 50.0000 mg | ORAL_TABLET | Freq: Every day | ORAL | 3 refills | Status: DC

## 2022-01-03 NOTE — Assessment & Plan Note (Signed)
We discussed age appropriate health related issues, including available/recomended screening tests and vaccinations. Labs were ordered to be later reviewed . All questions were answered. We discussed one or more of the following - seat belt use, use of sunscreen/sun exposure exercise, safe sex, fall risk reduction, second hand smoke exposure, firearm use and storage, seat belt use, a need for adhering to healthy diet and exercise. Labs were ordered.  All questions were answered. Colon 2018, due in 2023 mammo q 12 mo

## 2022-01-03 NOTE — Progress Notes (Signed)
Subjective:  Patient ID: Anna Ellis, female    DOB: Oct 13, 1951  Age: 71 y.o. MRN: [id number]  CC: Annual Exam   HPI Anna Ellis presents for a well exam C/o allergies C/o bone loss  Outpatient Medications Prior to Visit  Medication Sig Dispense Refill   acetaminophen (TYLENOL) 500 MG tablet Take 1,000 mg by mouth every 6 (six) hours as needed (for pain.).     aspirin EC 81 MG tablet Take 81 mg by mouth daily.     aspirin-acetaminophen-caffeine (EXCEDRIN MIGRAINE) 250-250-65 MG tablet Take 2 tablets by mouth every 6 (six) hours as needed for headache.     Cholecalciferol (VITAMIN D3) 2000 units TABS Take 2,000 Units by mouth daily.      ELDERBERRY PO Take by mouth daily.     metroNIDAZOLE (METROCREAM) 0.75 % cream Apply 1 application topically daily as needed (for rosacea). 45 g 3   mometasone (NASONEX) 50 MCG/ACT nasal spray Place 2 sprays into the nose daily. 17 g 3   ondansetron (ZOFRAN) 4 MG tablet Take 1 tablet (4 mg total) by mouth every 8 (eight) hours as needed for nausea or vomiting. 20 tablet 0   ALPRAZolam (XANAX) 0.25 MG tablet Take 1 tablet (0.25 mg total) by mouth 2 (two) times daily as needed. 60 tablet 1   atorvastatin (LIPITOR) 20 MG tablet Take 0.5 tablets (10 mg total) by mouth daily. 1/2 tablet(10mg ) daily. Insurance will only pay for 20mg  tablet. 90 tablet 3   etodolac (LODINE) 500 MG tablet TAKE 1 TABLET TWICE A DAY AS NEEDED WITH FOOD 180 tablet 3   losartan (COZAAR) 50 MG tablet TAKE 1 TABLET BY MOUTH EVERY DAY 90 tablet 2   omeprazole (PRILOSEC) 20 MG capsule Take 1 capsule (20 mg total) by mouth 2 (two) times daily before a meal. White caps please 180 capsule 3   cefUROXime (CEFTIN) 500 MG tablet Take 1 tablet (500 mg total) by mouth 2 (two) times daily. (Patient not taking: Reported on 01/03/2022) 20 tablet 0   methylPREDNISolone (MEDROL DOSEPAK) 4 MG TBPK tablet TAKE 6 TABLETS ON DAY 1 AS DIRECTED ON PACKAGE AND DECREASE BY 1 TAB EACH DAY FOR A TOTAL OF  6 DAYS (Patient not taking: Reported on 01/03/2022) 21 each 0   montelukast (SINGULAIR) 10 MG tablet Take 1 tablet (10 mg total) by mouth daily. 90 tablet 3   mupirocin ointment (BACTROBAN) 2 % Use bid (Patient not taking: Reported on 01/03/2022) 30 g 1   No facility-administered medications prior to visit.    ROS: Review of Systems  Constitutional:  Negative for activity change, appetite change, chills, fatigue and unexpected weight change.  HENT:  Negative for congestion, mouth sores and sinus pressure.   Eyes:  Negative for visual disturbance.  Respiratory:  Negative for cough and chest tightness.   Gastrointestinal:  Negative for abdominal pain and nausea.  Genitourinary:  Negative for difficulty urinating, frequency and vaginal pain.  Musculoskeletal:  Negative for back pain and gait problem.  Skin:  Negative for pallor and rash.  Neurological:  Negative for dizziness, tremors, weakness, numbness and headaches.  Psychiatric/Behavioral:  Negative for confusion and sleep disturbance.    Objective:  BP 138/86 (BP Location: Left Arm)    Pulse 72    Temp 98.3 F (36.8 C) (Oral)    Ht 5\' 4"  (1.626 m)    Wt 179 lb (81.2 kg)    SpO2 98%    BMI 30.73 kg/m   BP  Readings from Last 3 Encounters:  01/03/22 138/86  12/27/21 (!) 146/86  12/29/20 134/64    Wt Readings from Last 3 Encounters:  01/03/22 179 lb (81.2 kg)  12/27/21 179 lb 3.2 oz (81.3 kg)  12/29/20 177 lb 6.4 oz (80.5 kg)    Physical Exam Constitutional:      General: She is not in acute distress.    Appearance: She is well-developed.  HENT:     Head: Normocephalic.     Right Ear: External ear normal.     Left Ear: External ear normal.     Nose: Nose normal.  Eyes:     General:        Right eye: No discharge.        Left eye: No discharge.     Conjunctiva/sclera: Conjunctivae normal.     Pupils: Pupils are equal, round, and reactive to light.  Neck:     Thyroid: No thyromegaly.     Vascular: No JVD.     Trachea:  No tracheal deviation.  Cardiovascular:     Rate and Rhythm: Normal rate and regular rhythm.     Heart sounds: Normal heart sounds.  Pulmonary:     Effort: No respiratory distress.     Breath sounds: No stridor. No wheezing.  Abdominal:     General: Bowel sounds are normal. There is no distension.     Palpations: Abdomen is soft. There is no mass.     Tenderness: There is no abdominal tenderness. There is no guarding or rebound.  Musculoskeletal:        General: No tenderness.     Cervical back: Normal range of motion and neck supple. No rigidity.  Lymphadenopathy:     Cervical: No cervical adenopathy.  Skin:    Findings: No erythema or rash.  Neurological:     Mental Status: She is oriented to person, place, and time.     Cranial Nerves: No cranial nerve deficit.     Motor: No abnormal muscle tone.     Coordination: Coordination normal.     Deep Tendon Reflexes: Reflexes normal.  Psychiatric:        Behavior: Behavior normal.        Thought Content: Thought content normal.        Judgment: Judgment normal.    I spent 22 minutes in addition to time for CPX wellness examination in preparing to see the patient by review of recent labs, imaging and procedures, obtaining and reviewing separately obtained history, communicating with the patient, ordering medications, tests or procedures, and documenting clinical information in the EHR including the differential diagnosis, treatment, and any further evaluation and other management of osteopenia, allergy to dyes, recurrent sinus infection episodes, anxiety and arthritis         Lab Results  Component Value Date   WBC 4.2 01/03/2022   HGB 13.7 01/03/2022   HCT 40.7 01/03/2022   PLT 330.0 01/03/2022   GLUCOSE 90 01/03/2022   CHOL 158 01/03/2022   TRIG 95.0 01/03/2022   HDL 54.10 01/03/2022   LDLCALC 85 01/03/2022   ALT 17 01/03/2022   AST 22 01/03/2022   NA 141 01/03/2022   K 4.1 01/03/2022   CL 104 01/03/2022   CREATININE  0.70 01/03/2022   BUN 16 01/03/2022   CO2 28 01/03/2022   TSH 2.69 01/03/2022    DG Bone Density  Result Date: 05/03/2021 Date of study: 05/01/2021 Exam: DUAL X-RAY ABSORPTIOMETRY (DXA) FOR BONE MINERAL DENSITY (BMD)  Instrument: [employer] Requesting Provider: PCP Indication: follow up for low BMD Comparison: 2017 Clinical data: Pt is a 71 y.o. female without previous history of fracture. Results:  Lumbar spine L1-L4(L2,L3) Femoral neck (FN) T-score  -1.2 RFN: -1.5 LFN: -1.2 Change in BMD from previous DXA test (%) Up 2.5% Down 2.3% (*) statistically significant Assessment: By the [facility] Criteria for diagnosis based on bone density, this patient has Low Bone Density Z Score compares the patients bone density to age, sex, and race matched controls.  Compared to age, sex, and race matched controls, this patient's bone density is Average FRAX 10-year fracture risk calculator: 10 % for any major fracture and 1.6 % for hip fracture.  Pharmacologic therapy is recommended if 10 year fracture risk is >20% for any major osteoporotic fracture or >3% for hip fracture.   L2, L3 vertebrae had to be excluded from analysis due to DJD Comments: the technical quality of the study is good. WHO criteria for diagnosis of osteoporosis in postmenopausal women and in men 80 y/o or older: - normal: T-score -1.0 to + 1.0 - osteopenia/low bone density: T-score between -2.5 and -1.0 - osteoporosis: T-score below -2.5 - severe osteoporosis: T-score below -2.5 with history of fragility fracture Note: although not part of the WHO classification, the presence of a fragility fracture, regardless of the T-score, should be considered diagnostic of osteoporosis, provided other causes for the fracture have been excluded. 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       Anxiety disorder    Chronic  Xanax prn  Potential benefits of a long term benzodiazepines  use as well as potential risks  and complications were explained to the patient and were aknowledged.      Relevant Medications   ALPRAZolam (XANAX) 0.25 MG tablet   Osteopenia    BDS scan is due in 5/24 Vit D We can try Actonel      Sinusitis    Recurrent  Ceftin and prednisone taper usually helps-renewed Prednisone 10 mg: take 4 tabs a day x 3 days; then 3 tabs a day x 4 days; then 2 tabs a day x 4 days, then 1 tab a day x 6 days, then stop. Take pc.      Relevant Medications   cefUROXime (CEFTIN) 500 MG tablet   methylPREDNISolone (MEDROL DOSEPAK) 4 MG TBPK tablet   Well adult exam    We discussed age appropriate health related issues, including available/recomended screening tests and vaccinations. Labs were ordered to be later reviewed . All questions were answered. We discussed one or more of the following - seat belt use, use of sunscreen/sun exposure exercise, safe sex, fall risk reduction, second hand smoke exposure, firearm use and storage, seat belt use, a need for adhering to healthy diet and exercise. Labs were ordered.  All questions were answered. Colon 2018, due in 2023 mammo q 12 mo      Relevant Orders   TSH (Completed)   Urinalysis (Completed)   CBC with Differential/Platelet (Completed)   Lipid panel (Completed)   Comprehensive metabolic panel (Completed)      Meds ordered this encounter  Medications   risedronate (ACTONEL) 35 MG tablet    Sig: Take 1 tablet (35 mg total) by mouth every 7 (seven) days.  with water on empty stomach, nothing by mouth or lie down for next 30 minutes.    Dispense:  4 tablet    Refill:  11    White tablets please   losartan (COZAAR) 50 MG tablet    Sig: Take 1 tablet (50 mg total) by mouth daily.    Dispense:  90 tablet    Refill:  3   etodolac (LODINE) 500 MG tablet    Sig: TAKE 1 TABLET TWICE A DAY AS NEEDED WITH FOOD     Dispense:  180 tablet    Refill:  3   montelukast (SINGULAIR) 10 MG tablet    Sig: Take 1 tablet (10 mg total) by mouth daily.    Dispense:  90 tablet    Refill:  3   omeprazole (PRILOSEC) 20 MG capsule    Sig: Take 1 capsule (20 mg total) by mouth 2 (two) times daily before a meal. White caps please    Dispense:  180 capsule    Refill:  3   ALPRAZolam (XANAX) 0.25 MG tablet    Sig: Take 1 tablet (0.25 mg total) by mouth 2 (two) times daily as needed.    Dispense:  60 tablet    Refill:  1   atorvastatin (LIPITOR) 10 MG tablet    Sig: Take 1 tablet (10 mg total) by mouth daily.    Dispense:  90 tablet    Refill:  3   cefUROXime (CEFTIN) 500 MG tablet    Sig: Take 1 tablet (500 mg total) by mouth 2 (two) times daily.    Dispense:  20 tablet    Refill:  0   methylPREDNISolone (MEDROL DOSEPAK) 4 MG TBPK tablet    Sig: TAKE 6 TABLETS ON DAY 1 AS DIRECTED ON PACKAGE AND DECREASE BY 1 TAB EACH DAY FOR A TOTAL OF 6 DAYS    Dispense:  21 each    Refill:  0      Follow-up: Return in about 6 months (around 07/03/2022) for a follow-up visit.  Walker Kehr, MD

## 2022-01-03 NOTE — Assessment & Plan Note (Signed)
BDS scan is due in 5/24 Vit D We can try Actonel

## 2022-01-15 NOTE — Assessment & Plan Note (Signed)
We are avoiding red dye in tablets, capsules etc.

## 2022-01-15 NOTE — Assessment & Plan Note (Signed)
Recurrent  Ceftin and prednisone taper usually helps-renewed Prednisone 10 mg: take 4 tabs a day x 3 days; then 3 tabs a day x 4 days; then 2 tabs a day x 4 days, then 1 tab a day x 6 days, then stop. Take pc.

## 2022-01-15 NOTE — Assessment & Plan Note (Signed)
Chronic  Xanax prn  Potential benefits of a long term benzodiazepines  use as well as potential risks  and complications were explained to the patient and were aknowledged. 

## 2022-01-18 ENCOUNTER — Ambulatory Visit (INDEPENDENT_AMBULATORY_CARE_PROVIDER_SITE_OTHER): Payer: Medicare Other

## 2022-01-18 DIAGNOSIS — I441 Atrioventricular block, second degree: Secondary | ICD-10-CM | POA: Diagnosis not present

## 2022-01-18 LAB — CUP PACEART REMOTE DEVICE CHECK
Battery Remaining Longevity: 94 mo
Battery Voltage: 2.98 V
Brady Statistic AP VP Percent: 33.53 %
Brady Statistic AP VS Percent: 0 %
Brady Statistic AS VP Percent: 66.35 %
Brady Statistic AS VS Percent: 0.11 %
Brady Statistic RA Percent Paced: 33.53 %
Brady Statistic RV Percent Paced: 99.89 %
Date Time Interrogation Session: 20230208224454
Implantable Lead Implant Date: 20190801
Implantable Lead Implant Date: 20190801
Implantable Lead Location: 753859
Implantable Lead Location: 753860
Implantable Lead Model: 3830
Implantable Lead Model: 5076
Implantable Pulse Generator Implant Date: 20190801
Lead Channel Impedance Value: 361 Ohm
Lead Channel Impedance Value: 380 Ohm
Lead Channel Impedance Value: 399 Ohm
Lead Channel Impedance Value: 456 Ohm
Lead Channel Pacing Threshold Amplitude: 0.625 V
Lead Channel Pacing Threshold Amplitude: 0.875 V
Lead Channel Pacing Threshold Pulse Width: 0.4 ms
Lead Channel Pacing Threshold Pulse Width: 0.4 ms
Lead Channel Sensing Intrinsic Amplitude: 4.5 mV
Lead Channel Sensing Intrinsic Amplitude: 4.5 mV
Lead Channel Sensing Intrinsic Amplitude: 5.375 mV
Lead Channel Sensing Intrinsic Amplitude: 8.375 mV
Lead Channel Setting Pacing Amplitude: 2 V
Lead Channel Setting Pacing Amplitude: 2.5 V
Lead Channel Setting Pacing Pulse Width: 0.4 ms
Lead Channel Setting Sensing Sensitivity: 1.2 mV

## 2022-01-23 NOTE — Progress Notes (Signed)
Remote pacemaker transmission.   

## 2022-03-05 LAB — HM MAMMOGRAPHY

## 2022-03-09 ENCOUNTER — Encounter: Payer: Self-pay | Admitting: Internal Medicine

## 2022-04-05 ENCOUNTER — Telehealth: Payer: Self-pay | Admitting: Internal Medicine

## 2022-04-05 NOTE — Telephone Encounter (Signed)
LVM for pt to rtn my call to schedule AWV with [facility]. Please schedule this appt if pt calls the office.  °

## 2022-04-19 ENCOUNTER — Ambulatory Visit (INDEPENDENT_AMBULATORY_CARE_PROVIDER_SITE_OTHER): Payer: Medicare Other

## 2022-04-19 DIAGNOSIS — I441 Atrioventricular block, second degree: Secondary | ICD-10-CM

## 2022-04-19 LAB — CUP PACEART REMOTE DEVICE CHECK
Battery Remaining Longevity: 91 mo
Battery Voltage: 2.98 V
Brady Statistic AP VP Percent: 38.37 %
Brady Statistic AP VS Percent: 0 %
Brady Statistic AS VP Percent: 61.47 %
Brady Statistic AS VS Percent: 0.17 %
Brady Statistic RA Percent Paced: 38.36 %
Brady Statistic RV Percent Paced: 99.83 %
Date Time Interrogation Session: 20230510215817
Implantable Lead Implant Date: 20190801
Implantable Lead Implant Date: 20190801
Implantable Lead Location: 753859
Implantable Lead Location: 753860
Implantable Lead Model: 3830
Implantable Lead Model: 5076
Implantable Pulse Generator Implant Date: 20190801
Lead Channel Impedance Value: 342 Ohm
Lead Channel Impedance Value: 361 Ohm
Lead Channel Impedance Value: 399 Ohm
Lead Channel Impedance Value: 456 Ohm
Lead Channel Pacing Threshold Amplitude: 0.625 V
Lead Channel Pacing Threshold Amplitude: 1 V
Lead Channel Pacing Threshold Pulse Width: 0.4 ms
Lead Channel Pacing Threshold Pulse Width: 0.4 ms
Lead Channel Sensing Intrinsic Amplitude: 4.625 mV
Lead Channel Sensing Intrinsic Amplitude: 4.625 mV
Lead Channel Sensing Intrinsic Amplitude: 7.375 mV
Lead Channel Sensing Intrinsic Amplitude: 7.375 mV
Lead Channel Setting Pacing Amplitude: 2 V
Lead Channel Setting Pacing Amplitude: 2.5 V
Lead Channel Setting Pacing Pulse Width: 0.4 ms
Lead Channel Setting Sensing Sensitivity: 1.2 mV

## 2022-04-26 NOTE — Progress Notes (Signed)
Remote pacemaker transmission.   

## 2022-05-01 ENCOUNTER — Ambulatory Visit (INDEPENDENT_AMBULATORY_CARE_PROVIDER_SITE_OTHER): Payer: Medicare Other | Admitting: Family Medicine

## 2022-05-01 ENCOUNTER — Encounter: Payer: Self-pay | Admitting: Family Medicine

## 2022-05-01 VITALS — BP 132/80 | HR 75 | Temp 97.0°F | Ht 64.0 in | Wt 174.0 lb

## 2022-05-01 DIAGNOSIS — R1011 Right upper quadrant pain: Secondary | ICD-10-CM

## 2022-05-01 DIAGNOSIS — R6881 Early satiety: Secondary | ICD-10-CM | POA: Diagnosis not present

## 2022-05-01 DIAGNOSIS — Z9049 Acquired absence of other specified parts of digestive tract: Secondary | ICD-10-CM | POA: Diagnosis not present

## 2022-05-01 DIAGNOSIS — K219 Gastro-esophageal reflux disease without esophagitis: Secondary | ICD-10-CM

## 2022-05-01 LAB — COMPREHENSIVE METABOLIC PANEL
ALT: 16 U/L (ref 0–35)
AST: 19 U/L (ref 0–37)
Albumin: 4.6 g/dL (ref 3.5–5.2)
Alkaline Phosphatase: 61 U/L (ref 39–117)
BUN: 17 mg/dL (ref 6–23)
CO2: 29 mEq/L (ref 19–32)
Calcium: 9.5 mg/dL (ref 8.4–10.5)
Chloride: 105 mEq/L (ref 96–112)
Creatinine, Ser: 0.73 mg/dL (ref 0.40–1.20)
GFR: 81.91 mL/min (ref >60.00)
Glucose, Bld: 95 mg/dL (ref 70–99)
Potassium: 4.1 mEq/L (ref 3.5–5.1)
Sodium: 140 mEq/L (ref 135–145)
Total Bilirubin: 0.5 mg/dL (ref 0.2–1.2)
Total Protein: 6.9 g/dL (ref 6.0–8.3)

## 2022-05-01 LAB — CBC WITH DIFFERENTIAL/PLATELET
Basophils Absolute: 0 10*3/uL (ref 0.0–0.1)
Basophils Relative: 1 % (ref 0.0–3.0)
Eosinophils Absolute: 0.1 10*3/uL (ref 0.0–0.7)
Eosinophils Relative: 2.4 % (ref 0.0–5.0)
HCT: 42.2 % (ref 36.0–46.0)
Hemoglobin: 14.4 g/dL (ref 12.0–15.0)
Lymphocytes Relative: 25 % (ref 12.0–46.0)
Lymphs Abs: 1.3 10*3/uL (ref 0.7–4.0)
MCHC: 34.1 g/dL (ref 30.0–36.0)
MCV: 94.2 fl (ref 78.0–100.0)
Monocytes Absolute: 0.6 10*3/uL (ref 0.1–1.0)
Monocytes Relative: 11.5 % (ref 3.0–12.0)
Neutro Abs: 3 10*3/uL (ref 1.4–7.7)
Neutrophils Relative %: 60.1 % (ref 43.0–77.0)
Platelets: 330 10*3/uL (ref 150.0–400.0)
RBC: 4.48 Mil/uL (ref 3.87–5.11)
RDW: 12.9 % (ref 11.5–15.5)
WBC: 5 10*3/uL (ref 4.0–10.5)

## 2022-05-01 LAB — LIPASE: Lipase: 25 U/L (ref 11.0–59.0)

## 2022-05-01 NOTE — Progress Notes (Signed)
Subjective:     Patient ID: Anna Ellis, female    DOB: 01/26/1951, 71 y.o.   MRN: [id number]  Chief Complaint  Patient presents with   Abdominal Pain    Stomach and rib pain on upper right side since she had her gallbladder removed. Happens after eating and radiates to other locations but mostly stays in same area, needs new GI referral    HPI Patient is in today for right rib and side pain x 1 month. No injury. No rash.   Takes Tums, probiotic and omeprazole once daily acid reflux.   States she called Dr. Liliane Channel office and was told he was retiring. She requests a new referral for GI.   Gallbladder surgery in 1994.   Denies fever, chills, dizziness, chest pain, palpitations, shortness of breath, N/V/D, urinary symptoms, LE edema.     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  Normal LVEDP.   PACEMAKER IMPLANT N/A 07/10/2018    Medtronic Azure XT DR MRI SureScan model [id number] (serial number [id number]) pacemaker by Dr Rayann Heman with His bundle lead   TEAR DUCT [place]   VAGINAL HYSTERECTOMY  1990   WISDOM TOOTH EXTRACTION  1967    Family History  Problem Relation Age of Onset   Breast cancer Mother        breast   Heart disease Father        h/o Rheumatic Fever - Rheumatic MV Dz.   Allergies Son    Asthma Son    Tuberculosis Maternal Grandmother        died in her 38s   Throat cancer Maternal Grandfather    Pneumonia Paternal Grandmother     Social History   Socioeconomic History   Marital status: Married    Spouse name: Not on file   Number of children: 3   Years of education: Not on file   Highest education level: High school graduate  Occupational History   Occupation: Retired    Fish farm manager: RETIRED    Comment: Web designer  Tobacco Use   Smoking status: Never   Smokeless tobacco: Never  Vaping Use   Vaping Use: Never used  Substance and Sexual Activity   Alcohol use: Never   Drug  use: Never   Sexual activity: Yes  Other Topics Concern   Not on file  Social History Narrative   She is a married (49 years).  They have 3 children and 5 grandchildren.  She is a retired Financial trader for [employer].   She regularly exercises walking and using stairs etc.   Social Determinants of Health   Financial Resource Strain: Not on file  Food Insecurity: Not on file  Transportation Needs: Not on file  Physical Activity: Not on file  Stress: Not on file  Social Connections: Not on file  Intimate Partner Violence: Not on file    Outpatient Medications Prior to Visit  Medication Sig Dispense Refill   acetaminophen (TYLENOL) 500 MG tablet Take 1,000 mg by mouth every 6 (six) hours as needed (for pain.).     ALPRAZolam (XANAX) 0.25 MG tablet Take 1 tablet (0.25 mg total) by mouth 2 (two) times daily  as needed. 60 tablet 1   aspirin EC 81 MG tablet Take 81 mg by mouth daily.     aspirin-acetaminophen-caffeine (EXCEDRIN MIGRAINE) 250-250-65 MG tablet Take 2 tablets by mouth every 6 (six) hours as needed for headache.     atorvastatin (LIPITOR) 10 MG tablet Take 1 tablet (10 mg total) by mouth daily. 90 tablet 3   cefUROXime (CEFTIN) 500 MG tablet Take 1 tablet (500 mg total) by mouth 2 (two) times daily. 20 tablet 0   Cholecalciferol (VITAMIN D3) 2000 units TABS Take 2,000 Units by mouth daily.      ELDERBERRY PO Take by mouth daily.     etodolac (LODINE) 500 MG tablet TAKE 1 TABLET TWICE A DAY AS NEEDED WITH FOOD 180 tablet 3   losartan (COZAAR) 50 MG tablet Take 1 tablet (50 mg total) by mouth daily. 90 tablet 3   metroNIDAZOLE (METROCREAM) 0.75 % cream Apply 1 application topically daily as needed (for rosacea). 45 g 3   mometasone (NASONEX) 50 MCG/ACT nasal spray Place 2 sprays into the nose daily. 17 g 3   montelukast (SINGULAIR) 10 MG tablet Take 1 tablet (10 mg total) by mouth daily. 90 tablet 3   omeprazole (PRILOSEC) 20 MG capsule Take 1 capsule (20 mg total) by mouth 2 (two) times daily before a meal. White caps please 180 capsule 3   ondansetron (ZOFRAN) 4 MG tablet Take 1 tablet (4 mg total) by mouth every 8 (eight) hours as needed for nausea or vomiting. 20 tablet 0   risedronate (ACTONEL) 35 MG tablet Take 1 tablet (35 mg total) by mouth every 7 (seven) days. with water on empty stomach, nothing by mouth or lie down for next 30 minutes. 4 tablet 11   methylPREDNISolone (MEDROL DOSEPAK) 4 MG TBPK tablet TAKE 6 TABLETS ON DAY 1 AS DIRECTED ON PACKAGE AND DECREASE BY 1 TAB EACH DAY FOR A TOTAL OF 6 DAYS 21 each 0   No facility-administered medications prior to visit.    Allergies  Allergen Reactions   Clarithromycin Hives   Food Color Red Hives   Sulfonamide Derivatives Hives   [place] Rash    Found on Allergy Test   Parabens Rash    ROS Pertinent positives and negatives  in the history of present illness.     Objective:    Physical Exam Constitutional:      General: She is not in acute distress.    Appearance: She is not ill-appearing.  Eyes:     General: No scleral icterus.  Pupils: Pupils are equal, round, and reactive to light.  Cardiovascular:     Rate and Rhythm: Normal rate.  Pulmonary:     Effort: Pulmonary effort is normal.     Breath sounds: Normal breath sounds.  Abdominal:     General: Abdomen is flat. Bowel sounds are normal. There is no distension.     Palpations: Abdomen is soft. There is no hepatomegaly.     Tenderness: There is abdominal tenderness in the right upper quadrant. There is no right CVA tenderness, left CVA tenderness, guarding or rebound. Negative signs include Murphy's sign and McBurney's sign.  Skin:    General: Skin is warm and dry.  Neurological:     General: No focal deficit present.     Mental Status: She is alert and oriented to person, place, and time.     Motor: No weakness.  Psychiatric:        Mood and Affect: Mood normal.    BP 132/80 (BP Location: Left Arm, Patient Position: Sitting, Cuff Size: Large)   Pulse 75   Temp (!) 97 F (36.1 C) (Temporal)   Ht '5\' 4"'$  (1.626 m)   Wt 174 lb (78.9 kg)   SpO2 98%   BMI 29.87 kg/m  Wt Readings from Last 3 Encounters:  05/01/22 174 lb (78.9 kg)  01/03/22 179 lb (81.2 kg)  12/27/21 179 lb 3.2 oz (81.3 kg)       Assessment & Plan:   Problem List Items Addressed This Visit       Digestive   Gastroesophageal reflux disease    Continue omeprazole. May increase to twice daily or take it once daily and add Pepcid once daily. Referral to GI       Relevant Orders   Ambulatory referral to Gastroenterology     Other   Early satiety    Pain after eating. Eat a bland diet and small amounts. RUQ [place] ordered and referral to GI       Relevant Orders   Ambulatory referral to Gastroenterology   CBC with Differential/Platelet (Completed)   Comprehensive  metabolic panel (Completed)   Lipase (Completed)   US Abdomen Limited RUQ (LIVER/GB)   Hx of cholecystectomy   Relevant Orders   Ambulatory referral to Gastroenterology   US Abdomen Limited RUQ (LIVER/GB)   RUQ pain - Primary    cholecystectomy in 1994. Unclear etiology. No distress and no sign of infectious process. RUQ [place] ordered and referral to GI.        Relevant Orders   Ambulatory referral to Gastroenterology   CBC with Differential/Platelet (Completed)   Comprehensive metabolic panel (Completed)   Lipase (Completed)   US Abdomen Limited RUQ (LIVER/GB)    I have discontinued Pamala Hurry R. Diana's methylPREDNISolone. I am also having her maintain her Vitamin D3, aspirin EC, aspirin-acetaminophen-caffeine, acetaminophen, ELDERBERRY PO, metroNIDAZOLE, mometasone, ondansetron, risedronate, losartan, etodolac, montelukast, omeprazole, ALPRAZolam, atorvastatin, and cefUROXime.  No orders of the defined types were placed in this encounter.

## 2022-05-01 NOTE — Assessment & Plan Note (Signed)
Continue omeprazole. May increase to twice daily or take it once daily and add Pepcid once daily. Referral to GI

## 2022-05-01 NOTE — Assessment & Plan Note (Signed)
Pain after eating. Eat a bland diet and small amounts. RUQ [place] ordered and referral to GI

## 2022-05-01 NOTE — Assessment & Plan Note (Signed)
cholecystectomy in 1994. Unclear etiology. No distress and no sign of infectious process. RUQ [place] ordered and referral to GI.

## 2022-05-01 NOTE — Patient Instructions (Signed)
Go downstairs for labs.   You can take omeprazole twice daily or once daily and add Pepcid.   You will get a call from [facility].

## 2022-05-02 ENCOUNTER — Encounter: Payer: Self-pay | Admitting: Internal Medicine

## 2022-05-04 ENCOUNTER — Ambulatory Visit
Admission: RE | Admit: 2022-05-04 | Discharge: 2022-05-04 | Disposition: A | Discharge status: Home / Self Care | Payer: Medicare Other | Source: Ambulatory Visit | Attending: Family Medicine | Admitting: Family Medicine

## 2022-05-04 DIAGNOSIS — Z9049 Acquired absence of other specified parts of digestive tract: Secondary | ICD-10-CM

## 2022-05-04 DIAGNOSIS — R1011 Right upper quadrant pain: Secondary | ICD-10-CM

## 2022-05-04 DIAGNOSIS — R6881 Early satiety: Secondary | ICD-10-CM

## 2022-05-04 NOTE — Progress Notes (Signed)
Please relay my message to her and advise her all of the information is in her mychart note.

## 2022-06-01 ENCOUNTER — Encounter: Payer: Self-pay | Admitting: Internal Medicine

## 2022-06-01 ENCOUNTER — Ambulatory Visit (INDEPENDENT_AMBULATORY_CARE_PROVIDER_SITE_OTHER): Payer: Medicare Other | Admitting: Internal Medicine

## 2022-06-01 VITALS — BP 142/92 | HR 76 | Ht 64.0 in | Wt 174.2 lb

## 2022-06-01 DIAGNOSIS — R1013 Epigastric pain: Secondary | ICD-10-CM

## 2022-06-01 DIAGNOSIS — R110 Nausea: Secondary | ICD-10-CM

## 2022-06-24 ENCOUNTER — Encounter: Payer: Self-pay | Admitting: Certified Registered Nurse Anesthetist

## 2022-06-28 ENCOUNTER — Ambulatory Visit (AMBULATORY_SURGERY_CENTER): Payer: Medicare Other | Admitting: Internal Medicine

## 2022-06-28 ENCOUNTER — Encounter: Payer: Self-pay | Admitting: Internal Medicine

## 2022-06-28 VITALS — BP 124/50 | HR 60 | Temp 97.8°F | Resp 12 | Ht 64.0 in | Wt 174.0 lb

## 2022-06-28 DIAGNOSIS — K449 Diaphragmatic hernia without obstruction or gangrene: Secondary | ICD-10-CM

## 2022-06-28 DIAGNOSIS — K2970 Gastritis, unspecified, without bleeding: Secondary | ICD-10-CM

## 2022-06-28 DIAGNOSIS — K2980 Duodenitis without bleeding: Secondary | ICD-10-CM | POA: Diagnosis not present

## 2022-06-28 DIAGNOSIS — R1013 Epigastric pain: Secondary | ICD-10-CM | POA: Diagnosis not present

## 2022-06-28 DIAGNOSIS — K222 Esophageal obstruction: Secondary | ICD-10-CM

## 2022-06-28 DIAGNOSIS — K315 Obstruction of duodenum: Secondary | ICD-10-CM

## 2022-06-28 DIAGNOSIS — R110 Nausea: Secondary | ICD-10-CM

## 2022-06-28 DIAGNOSIS — K3189 Other diseases of stomach and duodenum: Secondary | ICD-10-CM | POA: Diagnosis not present

## 2022-06-28 DIAGNOSIS — K2950 Unspecified chronic gastritis without bleeding: Secondary | ICD-10-CM | POA: Diagnosis not present

## 2022-06-28 DIAGNOSIS — K2960 Other gastritis without bleeding: Secondary | ICD-10-CM

## 2022-06-28 DIAGNOSIS — R1011 Right upper quadrant pain: Secondary | ICD-10-CM

## 2022-06-28 HISTORY — PX: UPPER GI ENDOSCOPY: SHX6162

## 2022-06-28 MED ORDER — OMEPRAZOLE 40 MG PO CPDR: 40.0000 mg | DELAYED_RELEASE_CAPSULE | Freq: Two times a day (BID) | ORAL | 3 refills | Status: DC

## 2022-06-28 MED ORDER — SODIUM CHLORIDE 0.9 % IV SOLN: 500.0000 mL | Freq: Once | INTRAVENOUS | Status: DC

## 2022-06-28 NOTE — Op Note (Signed)
[facility] Patient Name: Anna Ellis Procedure Date: 06/28/2022 9:08 AM MRN: [id number] Endoscopist: Sonny Masters "Anna Ellis ,  Age: 71 Referring MD:  Date of Birth: 1951/02/11 Gender: Female Account #: [id number] Procedure:                Upper GI endoscopy Indications:              Epigastric abdominal pain, Abdominal pain in the                            right upper quadrant Medicines:                Monitored Anesthesia Care Procedure:                Pre-Anesthesia Assessment:                           - Prior to the procedure, a History and Physical                            was performed, and patient medications and                            allergies were reviewed. The patient's tolerance of                            previous anesthesia was also reviewed. The risks                            and benefits of the procedure and the sedation                            options and risks were discussed with the patient.                            All questions were answered, and informed consent                            was obtained. Prior Anticoagulants: The patient has                            taken no previous anticoagulant or antiplatelet                            agents. ASA Grade Assessment: II - A patient with                            mild systemic disease. After reviewing the risks                            and benefits, the patient was deemed in                            satisfactory condition to undergo the procedure.  After obtaining informed consent, the endoscope was                            passed under direct vision. Throughout the                            procedure, the patient's blood pressure, pulse, and                            oxygen saturations were monitored continuously. The                            Endoscope was introduced through the mouth, and                            advanced to the second part of  duodenum. The upper                            GI endoscopy was accomplished without difficulty.                            The patient tolerated the procedure well. Scope In: Scope Out: Findings:                 A non-obstructing Schatzki ring was found at the                            gastroesophageal junction.                           A 2 cm hiatal hernia was present.                           Localized inflammation characterized by congestion                            (edema) and erythema was found in the gastric body                            and in the gastric antrum. Biopsies were taken with                            a cold forceps for histology.                           An acquired benign-appearing, intrinsic stenosis                            was found in the first portion of the duodenum and                            was non-traversed. Biopsies were taken with a cold  forceps for histology. Complications:            No immediate complications. Estimated Blood Loss:     Estimated blood loss was minimal. Impression:               - Non-obstructing Schatzki ring.                           - 2 cm hiatal hernia.                           - Gastritis. Biopsied.                           - Acquired duodenal stenosis. Biopsied. Recommendation:           - Discharge patient to home (with escort).                           - It is suspected that your duodenal stricture                            formed due to use of NSAIDs.                           - Use Prilosec (omeprazole) 40 mg PO BID for 8                            weeks.                           - No ibuprofen, naproxen, or other non-steroidal                            anti-inflammatory drugs.                           - Await pathology results.                           - Return to GI clinic in 6 weeks.                           - The findings and recommendations were discussed                             with the patient. [address]Anna Ellis,  06/28/2022 9:34:34 AM

## 2022-06-28 NOTE — Progress Notes (Signed)
0910 Robinul 0.1 mg IV given due large amount of secretions upon assessment.  MD made aware, vss 

## 2022-06-28 NOTE — Patient Instructions (Signed)
No ibuprofen, naproxen, or other non steroidal anti-inflammatory drugs.  Prilosec 40 mg twice daily for 8 weeks    YOU HAD AN ENDOSCOPIC PROCEDURE TODAY AT [facility]:   Refer to the procedure report that was given to you for any specific questions about what was found during the examination.  If the procedure report does not answer your questions, please call your gastroenterologist to clarify.  If you requested that your care partner not be given the details of your procedure findings, then the procedure report has been included in a sealed envelope for you to review at your convenience later.  YOU SHOULD EXPECT: Some feelings of bloating in the abdomen. Passage of more gas than usual.  Walking can help get rid of the air that was put into your GI tract during the procedure and reduce the bloating. If you had a lower endoscopy (such as a colonoscopy or flexible sigmoidoscopy) you may notice spotting of blood in your stool or on the toilet paper. If you underwent a bowel prep for your procedure, you may not have a normal bowel movement for a few days.  Please Note:  You might notice some irritation and congestion in your nose or some drainage.  This is from the oxygen used during your procedure.  There is no need for concern and it should clear up in a day or so.  SYMPTOMS TO REPORT IMMEDIATELY:   Following upper endoscopy (EGD)  Vomiting of blood or coffee ground material  New chest pain or pain under the shoulder blades  Painful or persistently difficult swallowing  New shortness of breath  Fever of 100F or higher  Black, tarry-looking stools  For urgent or emergent issues, a gastroenterologist can be reached at any hour by calling [phone]. Do not use MyChart messaging for urgent concerns.    DIET:  We do recommend a small meal at first, but then you may proceed to your regular diet.  Drink plenty of fluids but you should avoid alcoholic beverages for 24  hours.  ACTIVITY:  You should plan to take it easy for the rest of today and you should NOT DRIVE or use heavy machinery until tomorrow (because of the sedation medicines used during the test).    FOLLOW UP: Our staff will call the number listed on your records the next business day following your procedure.  We will call around 7:15- 8:00 am to check on you and address any questions or concerns that you may have regarding the information given to you following your procedure. If we do not reach you, we will leave a message.  If you develop any symptoms (ie: fever, flu-like symptoms, shortness of breath, cough etc.) before then, please call [phone].  If you test positive for Covid 19 in the 2 weeks post procedure, please call and report this information to [place].    If any biopsies were taken you will be contacted by phone or by letter within the next 1-3 weeks.  Please call us at [phone] if you have not heard about the biopsies in 3 weeks.    SIGNATURES/CONFIDENTIALITY: You and/or your care partner have signed paperwork which will be entered into your electronic medical record.  These signatures attest to the fact that that the information above on your After Visit Summary has been reviewed and is understood.  Full responsibility of the confidentiality of this discharge information lies with you and/or your care-partner.

## 2022-06-28 NOTE — Progress Notes (Signed)
Report given to PACU, vss 

## 2022-06-28 NOTE — Progress Notes (Signed)
Called to room to assist during endoscopic procedure.  Patient ID and intended procedure confirmed with present staff. Received instructions for my participation in the procedure from the performing physician.  

## 2022-06-28 NOTE — Progress Notes (Signed)
GASTROENTEROLOGY PROCEDURE H&P NOTE   Primary Care Physician: Cassandria Anger, MD    Reason for Procedure:   Epigastric and RUQ ab pain, nausea  Plan:    EGD  Patient is appropriate for endoscopic procedure(s) in the ambulatory ([place]) setting.  The nature of the procedure, as well as the risks, benefits, and alternatives were carefully and thoroughly reviewed with the patient. Ample time for discussion and questions allowed. The patient understood, was satisfied, and agreed to proceed.     HPI: Anna Ellis is a 71 y.o. female who presents for EGD for evaluation of epigastric and RUQ ab pain .  Patient was most recently seen in the Gastroenterology Clinic on 06/01/22.  No interval change in medical history since that appointment. Please refer to that note for full details regarding GI history and clinical presentation.   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 have diverticulosis   ESOPHAGOGASTRODUODENOSCOPY  [place] Left 1986   w/ACL repair   LAPAROSCOPIC CHOLECYSTECTOMY  1994   LEFT HEART CATH AND CORONARY ANGIOGRAPHY N/A 07/08/2018   Procedure: LEFT HEART CATH AND CORONARY ANGIOGRAPHY;  Surgeon: Leonie Man, MD;  Location: [facility];   Service: Cardiovascular;; Angiographically normal coronary arteries except ~20-25% prox & mid LAD.  Normal LVEDP.   PACEMAKER IMPLANT N/A 07/10/2018   Medtronic Azure XT DR MRI SureScan model [id number] (serial number [id number]) pacemaker by Dr Rayann Heman with His bundle lead   [facility]    Prior to Admission medications   Medication Sig Start Date End Date Taking? Authorizing Provider  acetaminophen (TYLENOL) 500 MG tablet Take 1,000 mg by mouth every 6 (six) hours as needed (for pain.).   Yes [provider]  aspirin EC 81 MG tablet Take 81 mg by mouth daily.   Yes [provider]  atorvastatin (LIPITOR) 10 MG tablet Take 1 tablet (10 mg total) by mouth daily. 01/03/22  Yes Plotnikov, Evie Lacks, MD  Cholecalciferol (VITAMIN D3) 2000 units TABS Take 2,000 Units by mouth daily.    Yes [provider]  Famotidine (PEPCID AC PO) Take 1 tablet by mouth daily in the afternoon.   Yes [provider]  losartan (COZAAR) 50 MG tablet Take 1 tablet (50 mg total) by mouth daily. Patient taking differently: Take 25 mg by mouth daily. 01/03/22  Yes Plotnikov, Evie Lacks, MD  omeprazole (PRILOSEC) 20 MG capsule Take 1 capsule (20 mg total) by mouth  2 (two) times daily before a meal. White caps please Patient taking differently: Take 20 mg by mouth daily in the afternoon. White caps please 01/03/22  Yes Plotnikov, Evie Lacks, MD  Simethicone (GAS-X PO) Take 1 tablet by mouth daily in the afternoon.   Yes [provider]  ALPRAZolam (XANAX) 0.25 MG tablet Take 1 tablet (0.25 mg total) by mouth 2 (two) times daily as needed. 01/03/22   Plotnikov, Evie Lacks, MD  aspirin-acetaminophen-caffeine (EXCEDRIN MIGRAINE) [phone] MG tablet Take 2 tablets by mouth every 6 (six) hours as needed for headache.    [provider]  ELDERBERRY  PO Take by mouth daily.    [provider]  etodolac (LODINE) 500 MG tablet TAKE 1 TABLET TWICE A DAY AS NEEDED WITH FOOD 01/03/22   Plotnikov, Evie Lacks, MD  metroNIDAZOLE (METROCREAM) 0.75 % cream Apply 1 application topically daily as needed (for rosacea). 11/04/19   Plotnikov, Evie Lacks, MD  mometasone (NASONEX) 50 MCG/ACT nasal spray Place 2 sprays into the nose daily. Patient taking differently: Place 2 sprays into the nose as needed. 11/04/19   Plotnikov, Evie Lacks, MD  montelukast (SINGULAIR) 10 MG tablet Take 1 tablet (10 mg total) by mouth daily. Patient taking differently: Take 10 mg by mouth as needed. 01/03/22 01/03/23  Plotnikov, Evie Lacks, MD  ondansetron (ZOFRAN) 4 MG tablet Take 1 tablet (4 mg total) by mouth every 8 (eight) hours as needed for nausea or vomiting. Patient not taking: Reported on 06/01/2022 11/04/19   Plotnikov, Evie Lacks, MD    Current Outpatient Medications  Medication Sig Dispense Refill   acetaminophen (TYLENOL) 500 MG tablet Take 1,000 mg by mouth every 6 (six) hours as needed (for pain.).     aspirin EC 81 MG tablet Take 81 mg by mouth daily.     atorvastatin (LIPITOR) 10 MG tablet Take 1 tablet (10 mg total) by mouth daily. 90 tablet 3   Cholecalciferol (VITAMIN D3) 2000 units TABS Take 2,000 Units by mouth daily.      Famotidine (PEPCID AC PO) Take 1 tablet by mouth daily in the afternoon.     losartan (COZAAR) 50 MG tablet Take 1 tablet (50 mg total) by mouth daily. (Patient taking differently: Take 25 mg by mouth daily.) 90 tablet 3   omeprazole (PRILOSEC) 20 MG capsule Take 1 capsule (20 mg total) by mouth 2 (two) times daily before a meal. White caps please (Patient taking differently: Take 20 mg by mouth daily in the afternoon. White caps please) 180 capsule 3   Simethicone (GAS-X PO) Take 1 tablet by mouth daily in the afternoon.     ALPRAZolam (XANAX) 0.25 MG tablet Take 1 tablet (0.25 mg total) by mouth 2 (two) times daily as needed. 60  tablet 1   aspirin-acetaminophen-caffeine (EXCEDRIN MIGRAINE) 250-250-65 MG tablet Take 2 tablets by mouth every 6 (six) hours as needed for headache.     ELDERBERRY PO Take by mouth daily.     etodolac (LODINE) 500 MG tablet TAKE 1 TABLET TWICE A DAY AS NEEDED WITH FOOD 180 tablet 3   metroNIDAZOLE (METROCREAM) 0.75 % cream Apply 1 application topically daily as needed (for rosacea). 45 g 3   mometasone (NASONEX) 50 MCG/ACT nasal spray Place 2 sprays into the nose daily. (Patient taking differently: Place 2 sprays into the nose as needed.) 17 g 3   montelukast (SINGULAIR) 10 MG tablet Take 1 tablet (10 mg total) by mouth daily. (Patient taking differently: Take 10 mg  by mouth as needed.) 90 tablet 3   ondansetron (ZOFRAN) 4 MG tablet Take 1 tablet (4 mg total) by mouth every 8 (eight) hours as needed for nausea or vomiting. (Patient not taking: Reported on 06/01/2022) 20 tablet 0   Current Facility-Administered Medications  Medication Dose Route Frequency Provider Last Rate Last Admin   0.9 %  sodium chloride infusion  500 mL Intravenous Once Sharyn Creamer, MD        Allergies as of 06/28/2022 - Review Complete 06/28/2022  Allergen Reaction Noted   Clarithromycin Hives 09/19/2007   Food color red Hives 09/19/2007   Sulfonamide derivatives Hives 09/19/2007   [place] Rash 01/23/2016   Parabens Rash 01/23/2016    Family History  Problem Relation Age of Onset   Breast cancer Mother        breast   Heart disease Father        h/o Rheumatic Fever - Rheumatic MV Dz.   Colon cancer Paternal Aunt 56 - 89   Tuberculosis Maternal Grandmother        died in her 67s   Throat cancer Maternal Grandfather    Pneumonia Paternal Grandmother    Allergies Son    Asthma Son    Esophageal cancer Neg Hx    Rectal cancer Neg Hx    Stomach cancer Neg Hx     Social History   Socioeconomic History   Marital status: Married    Spouse name: Not on file   Number of children: 3   Years of education:  Not on file   Highest education level: High school graduate  Occupational History   Occupation: Retired    Fish farm manager: RETIRED    Comment: Web designer  Tobacco Use   Smoking status: Never   Smokeless tobacco: Never  Scientific laboratory technician Use: Never used  Substance and Sexual Activity   Alcohol use: Never   Drug use: Never   Sexual activity: Yes  Other Topics Concern   Not on file  Social History Narrative   She is a married (65 years).  They have 3 children and 5 grandchildren.  She is a retired Financial trader for [employer].   She regularly exercises walking and using stairs etc.   Social Determinants of Health   Financial Resource Strain: Not on file  Food Insecurity: Not on file  Transportation Needs: Not on file  Physical Activity: Not on file  Stress: Not on file  Social Connections: Not on file  Intimate Partner Violence: Not on file    Physical Exam: Vital signs in last 24 hours: BP 134/78   Pulse 70   Temp 97.8 F (36.6 C) (Temporal)   Ht '5\' 4"'$  (1.626 m)   Wt 174 lb (78.9 kg)   SpO2 100%   BMI 29.87 kg/m  GEN: NAD EYE: Sclerae anicteric ENT: MMM CV: Non-tachycardic Pulm: No increased WOB GI: Soft NEURO:  Alert & Oriented   Christia Reading, MD [facility]   06/28/2022 8:41 AM

## 2022-06-28 NOTE — Progress Notes (Signed)
Pt's states no medical or surgical changes since previsit or office visit. 

## 2022-06-29 ENCOUNTER — Telehealth: Payer: Self-pay

## 2022-06-29 NOTE — Telephone Encounter (Signed)
  Follow up Call-     06/28/2022    8:09 AM  Call back number  Post procedure Call Back phone  # [phone]  Permission to leave phone message Yes     Patient questions:  Do you have a fever, pain , or abdominal swelling? No. Pain Score  0 *  Have you tolerated food without any problems? Yes.    Have you been able to return to your normal activities? Yes.    Do you have any questions about your discharge instructions: Diet   No. Medications  No. Follow up visit  No.  Do you have questions or concerns about your Care? No.  Actions: * If pain score is 4 or above: No action needed, pain <4.

## 2022-07-03 ENCOUNTER — Encounter: Payer: Self-pay | Admitting: Internal Medicine

## 2022-07-19 ENCOUNTER — Ambulatory Visit (INDEPENDENT_AMBULATORY_CARE_PROVIDER_SITE_OTHER): Payer: Medicare Other

## 2022-07-19 DIAGNOSIS — I441 Atrioventricular block, second degree: Secondary | ICD-10-CM

## 2022-07-19 LAB — CUP PACEART REMOTE DEVICE CHECK
Battery Remaining Longevity: 84 mo
Battery Voltage: 2.98 V
Brady Statistic AP VP Percent: 46.25 %
Brady Statistic AP VS Percent: 0 %
Brady Statistic AS VP Percent: 53.61 %
Brady Statistic AS VS Percent: 0.14 %
Brady Statistic RA Percent Paced: 46.25 %
Brady Statistic RV Percent Paced: 99.86 %
Date Time Interrogation Session: 20230810011134
Implantable Lead Implant Date: 20190801
Implantable Lead Implant Date: 20190801
Implantable Lead Location: 753859
Implantable Lead Location: 753860
Implantable Lead Model: 3830
Implantable Lead Model: 5076
Implantable Pulse Generator Implant Date: 20190801
Lead Channel Impedance Value: 323 Ohm
Lead Channel Impedance Value: 342 Ohm
Lead Channel Impedance Value: 380 Ohm
Lead Channel Impedance Value: 418 Ohm
Lead Channel Pacing Threshold Amplitude: 0.625 V
Lead Channel Pacing Threshold Amplitude: 0.875 V
Lead Channel Pacing Threshold Pulse Width: 0.4 ms
Lead Channel Pacing Threshold Pulse Width: 0.4 ms
Lead Channel Sensing Intrinsic Amplitude: 3.25 mV
Lead Channel Sensing Intrinsic Amplitude: 3.25 mV
Lead Channel Sensing Intrinsic Amplitude: 7.375 mV
Lead Channel Sensing Intrinsic Amplitude: 7.375 mV
Lead Channel Setting Pacing Amplitude: 2 V
Lead Channel Setting Pacing Amplitude: 2.5 V
Lead Channel Setting Pacing Pulse Width: 0.4 ms
Lead Channel Setting Sensing Sensitivity: 1.2 mV

## 2022-08-14 ENCOUNTER — Encounter: Payer: Self-pay | Admitting: Internal Medicine

## 2022-08-14 ENCOUNTER — Ambulatory Visit (INDEPENDENT_AMBULATORY_CARE_PROVIDER_SITE_OTHER): Payer: Medicare Other | Admitting: Internal Medicine

## 2022-08-14 VITALS — BP 128/80 | HR 74 | Ht 64.0 in | Wt 172.1 lb

## 2022-08-14 DIAGNOSIS — K2990 Gastroduodenitis, unspecified, without bleeding: Secondary | ICD-10-CM | POA: Diagnosis not present

## 2022-08-14 DIAGNOSIS — K2970 Gastritis, unspecified, without bleeding: Secondary | ICD-10-CM | POA: Diagnosis not present

## 2022-08-14 DIAGNOSIS — R1011 Right upper quadrant pain: Secondary | ICD-10-CM

## 2022-08-14 DIAGNOSIS — K315 Obstruction of duodenum: Secondary | ICD-10-CM | POA: Diagnosis not present

## 2022-08-14 MED ORDER — OMEPRAZOLE 20 MG PO CPDR: 20.0000 mg | DELAYED_RELEASE_CAPSULE | Freq: Every day | ORAL | 3 refills | Status: DC

## 2022-08-14 NOTE — Patient Instructions (Addendum)
_______________________________________________________  If you are age 73 or older, your body mass index should be between 23-30. Your Body mass index is 29.55 kg/m. If this is out of the aforementioned range listed, please consider follow up with your Primary Care Provider.  If you are age 25 or younger, your body mass index should be between 19-25. Your Body mass index is 29.55 kg/m. If this is out of the aformentioned range listed, please consider follow up with your Primary Care Provider.   Reduce Omeprazole 20 mg once daily.  You have been scheduled for a CT scan of the abdomen and pelvis at [facility], 1st floor Radiology. You are scheduled on          at          . You should arrive 15 minutes prior to your appointment time for registration.  We are giving you 2 bottles of contrast today that you will need to drink before arriving for the exam. The solution may taste better if refrigerated so put them in the refrigerator when you get home, but do NOT add ice or any other liquid to this solution as that would dilute it. Shake well before drinking.   Please follow the written instructions below on the day of your exam:   1) Do not eat anything after        (4 hours prior to your test)   2) Drink 1 bottle of contrast @        (2 hours prior to your exam)  Remember to shake well before drinking and do NOT pour over ice.     Drink 1 bottle of contrast @       (1 hour prior to your exam)   You may take any medications as prescribed with a small amount of water, if necessary. If you take any of the following medications: METFORMIN, GLUCOPHAGE, GLUCOVANCE, AVANDAMET, RIOMET, FORTAMET, Norco MET, JANUMET, GLUMETZA or METAGLIP, you MAY be asked to HOLD this medication 48 hours AFTER the exam.   You will be contacted by [facility] in the next 2 days to arrange a CT abdomen and Pelvis.  The number on your caller ID will be [phone], please answer when they call.   If you have not heard from them in 2 days please call [phone] to schedule.     The purpose of you drinking the oral contrast is to aid in the visualization of your intestinal tract. The contrast solution may cause some diarrhea. Depending on your individual set of symptoms, you may also receive an intravenous injection of x-ray contrast/dye. Plan on being at [facility] for 45 minutes or longer, depending on the type of exam you are having performed.   If you have any questions regarding your exam or if you need to reschedule, you may call [facility] at [phone] between the hours of 8:00 am and 5:00 pm, Monday-Friday.   Thank you for entrusting me with your care and for choosing [facility], Dr. Christia Reading     ________________________________________________________  The [facility] providers would like to encourage you to use [facility] to communicate with providers for non-urgent requests or questions.  Due to long hold times on the telephone, sending your provider a message by [facility] may be a faster and more efficient way to get a response.  Please allow 48 business hours for a response.  Please remember that this is for non-urgent requests.  _______________________________________________________

## 2022-08-14 NOTE — Progress Notes (Signed)
Chief Complaint: RUQ and epigastric ab pain  HPI : 71 year old female with history of gallstones, osteoarthritis presents for follow up of RUQ and epigastric ab pain  Interval History: For the most part she has been doing well. Denies vomiting. Occasional nausea. Has continued to have RUQ ab pain, which mainly occurs with positional changes. She does notice that the pain also occurs in her back at times. Epigastric pain has resolved with PPI therapy. She has stopped NSAIDs and has been using Tylenol for pain  Wt Readings from Last 3 Encounters:  08/14/22 172 lb 2 oz (78.1 kg)  06/28/22 174 lb (78.9 kg)  06/01/22 174 lb 4 oz (79 kg)   Current Outpatient Medications  Medication Sig Dispense Refill   acetaminophen (TYLENOL) 500 MG tablet Take 1,000 mg by mouth every 6 (six) hours as needed (for pain.).     ALPRAZolam (XANAX) 0.25 MG tablet Take 1 tablet (0.25 mg total) by mouth 2 (two) times daily as needed. 60 tablet 1   aspirin EC 81 MG tablet Take 81 mg by mouth daily.     atorvastatin (LIPITOR) 10 MG tablet Take 1 tablet (10 mg total) by mouth daily. 90 tablet 3   Cholecalciferol (VITAMIN D3) 2000 units TABS Take 2,000 Units by mouth daily.      losartan (COZAAR) 50 MG tablet Take 1 tablet (50 mg total) by mouth daily. (Patient taking differently: Take 25 mg by mouth daily.) 90 tablet 3   metroNIDAZOLE (METROCREAM) 0.75 % cream Apply 1 application topically daily as needed (for rosacea). 45 g 3   mometasone (NASONEX) 50 MCG/ACT nasal spray Place 2 sprays into the nose daily. (Patient taking differently: Place 2 sprays into the nose as needed.) 17 g 3   montelukast (SINGULAIR) 10 MG tablet Take 1 tablet (10 mg total) by mouth daily. (Patient taking differently: Take 10 mg by mouth as needed.) 90 tablet 3   omeprazole (PRILOSEC) 40 MG capsule Take 1 capsule (40 mg total) by mouth in the morning and at bedtime. Take 40 mg twice daily for 8 weeks 60 capsule 3   Simethicone (GAS-X PO) Take 1  tablet by mouth daily in the afternoon.     aspirin-acetaminophen-caffeine (EXCEDRIN MIGRAINE) 250-250-65 MG tablet Take 2 tablets by mouth every 6 (six) hours as needed for headache. (Patient not taking: Reported on 08/14/2022)     ELDERBERRY PO Take by mouth daily. (Patient not taking: Reported on 08/14/2022)     etodolac (LODINE) 500 MG tablet TAKE 1 TABLET TWICE A DAY AS NEEDED WITH FOOD (Patient not taking: Reported on 08/14/2022) 180 tablet 3   Famotidine (PEPCID AC PO) Take 1 tablet by mouth daily in the afternoon. (Patient not taking: Reported on 08/14/2022)     ondansetron (ZOFRAN) 4 MG tablet Take 1 tablet (4 mg total) by mouth every 8 (eight) hours as needed for nausea or vomiting. (Patient not taking: Reported on 08/14/2022) 20 tablet 0   No current facility-administered medications for this visit.    Physical Exam: BP 128/80   Pulse 74   Ht '5\' 4"'$  (1.626 m)   Wt 172 lb 2 oz (78.1 kg)   BMI 29.55 kg/m  Constitutional: Pleasant,well-developed, female in no acute distress. HEENT: Normocephalic and atraumatic. Conjunctivae are normal. No scleral icterus. Cardiovascular: Normal rate, regular rhythm.  Pulmonary/chest: Effort normal and breath sounds normal. No wheezing, rales or rhonchi. Abdominal: Soft, nondistended, non-tender. Bowel sounds active throughout. There are no masses palpable. No hepatomegaly.  Extremities: No edema Neurological: Alert and oriented to person place and time. Skin: Skin is warm and dry. No rashes noted. Psychiatric: Normal mood and affect. Behavior is normal.  Labs 04/2018: H pylori antibody negative.  Labs 12/2021: TSH nml  Labs 04/2022: CBC nml. CMP nml. Lipase nml.   RUQ U/S 05/04/22: IMPRESSION: Limited study due to bowel gas. No biliary ductal dilatation or liver mass identified.  Colonoscopy 09/12/17: Diverticulosis of the sigmoid colon. Internal hemorrhoids, scarring from prior banding. Rec repeat colonoscopy in 10 years  EGD 06/28/22: -  Non-obstructing Schatzki ring. - 2 cm hiatal hernia. - Gastritis. Biopsied. - Acquired duodenal stenosis. Biopsied. Path: 1. Surgical [P], duodenal stricture SUPERFICIAL FRAGMENTS OF REACTIVE DUODENAL MUCOSA WITH EXTENSIVE GASTRIC METAPLASIA AND FOCAL CHANGES CONSISTENT WITH PRIOR EROSION AND COMPATIBLE WITH PEPTIC DUODENITIS (SEE MICROSCOPIC COMMENT) 2. Surgical [P], gastric CHRONIC, FOCALLY ACTIVE GASTRITIS WITH LYMPHOID AGGREGATES HELICOBACTER STAIN NEGATIVE (IHC, ADEQUATE CONTROL) NEGATIVE FOR INTESTINAL METAPLASIA, DYSPLASIA AND CARCINOMA Microscopic Comment 1. Sections of the first specimen labeled duodenal stricture show multiple superficial fragments of small bowel with Brunner's glands exhibiting blunted villi with mucin depletion and extensive surface gastric foveolar epithelium. Very focally there are stromal changes compatible with prior erosion. The scant lamina propria exhibits the normal complement of mononuclear cells. There is no increase in intraepithelial lymphocytes and the collagen table is of normal thickness. No granulomas or parasites are seen. There is no evidence of dysplasia or carcinoma  ASSESSMENT AND PLAN: Duodenal stricture Epigastric and RUQ ab pain Nausea Patient has had improvement in her epigastric ab pain, which may be related to NSAID-induced inflammation in the stomach and small bowel. Since she has responded well, will go ahead and reduce her PPI back down to her daily dosing. Her RUQ ab pain has persisted, though her description of the pain as being related to positional changes and radiating to the back does suggest to me that the pain may be MSK-related.  - Okay to reduce PPI down to QD. Can use nightly H2 blocker and Gas-X PRN.  - Check CT A/P w/contrast - Due for repeat colonoscopy in 09/2027 - Can consider repeat EGD in the future for dilation of duodenal stricture - RTC 3-4 months  Christia Reading, MD

## 2022-08-15 NOTE — Progress Notes (Signed)
Remote pacemaker transmission.   

## 2022-08-24 ENCOUNTER — Ambulatory Visit (HOSPITAL_COMMUNITY)
Admission: RE | Admit: 2022-08-24 | Discharge: 2022-08-24 | Disposition: A | Discharge status: Home / Self Care | Payer: Medicare Other | Source: Ambulatory Visit | Attending: Internal Medicine | Admitting: Internal Medicine

## 2022-08-24 DIAGNOSIS — K315 Obstruction of duodenum: Secondary | ICD-10-CM | POA: Diagnosis present

## 2022-08-24 DIAGNOSIS — K2970 Gastritis, unspecified, without bleeding: Secondary | ICD-10-CM | POA: Insufficient documentation

## 2022-08-24 DIAGNOSIS — R1011 Right upper quadrant pain: Secondary | ICD-10-CM | POA: Insufficient documentation

## 2022-08-24 DIAGNOSIS — K2990 Gastroduodenitis, unspecified, without bleeding: Secondary | ICD-10-CM | POA: Insufficient documentation

## 2022-08-24 MED ORDER — IOHEXOL 350 MG/ML SOLN
75.0000 mL | Freq: Once | INTRAVENOUS | Status: AC | PRN
  Administered 2022-08-24: 75 mL via INTRAVENOUS

## 2022-09-03 ENCOUNTER — Telehealth (INDEPENDENT_AMBULATORY_CARE_PROVIDER_SITE_OTHER): Payer: Medicare Other | Admitting: Physician Assistant

## 2022-09-03 ENCOUNTER — Encounter: Payer: Self-pay | Admitting: Physician Assistant

## 2022-09-03 VITALS — Ht 64.0 in | Wt 167.0 lb

## 2022-09-03 DIAGNOSIS — U071 COVID-19: Secondary | ICD-10-CM

## 2022-09-03 MED ORDER — NIRMATRELVIR/RITONAVIR (PAXLOVID)TABLET: 3.0000 | ORAL_TABLET | Freq: Two times a day (BID) | ORAL | 0 refills | Status: AC

## 2022-09-03 MED ORDER — PREDNISONE 20 MG PO TABS: 40.0000 mg | ORAL_TABLET | Freq: Every day | ORAL | 0 refills | Status: DC

## 2022-09-03 NOTE — Progress Notes (Signed)
Virtual Visit via Video   I connected with Anna Ellis on 09/03/22 at  3:40 PM EDT by a video enabled telemedicine application and verified that I am speaking with the correct person using two identifiers. Location patient: Home Location provider: [facility], Office Persons participating in the virtual visit: [name], Jo[place] PA-C, Anselmo Pickler, LPN   I discussed the limitations of evaluation and management by telemedicine and the availability of in person appointments. The patient expressed understanding and agreed to proceed.  I acted as a Education administrator for [employer], PA-C [employer], LPN   Subjective:   HPI:   Patient is requesting evaluation for possible COVID-19.  Symptom onset: Friday 9/23  Travel/contacts: not sure exposure, but husband has it too.   Vaccination status: 4 vaccines  Testing results: COVID Home test positive Sunday morning.  Patient endorses the following symptoms: Cough, headache, ears hurt, body aches, diarrhea 4 times today.  Patient denies the following symptoms: No fever, SOB, chills  Treatments tried: Mucinex  Patient risk factors: Does have hx of asthma/scarring of lungs but not on any maintenance or rescue meds Smoking status: [name]  reports that she has never smoked. She has never used smokeless tobacco. If female, currently pregnant? '[]'$   Yes '[x]'$   No  ROS: See pertinent positives and negatives per HPI.  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   Atypical facial pain 07/22/2013   Mouth ulcer 02/24/2013   Pain in right eye 10/30/2012   Well adult exam 01/14/2012   Serum potassium elevated 01/14/2012   Cystitis 01/14/2012   Cerumen impaction 11/19/2011   Nausea 09/11/2011   Fibrocystic breast disease 08/06/2011   Family history of breast cancer 08/06/2011   Gastroesophageal reflux disease 07/11/2011   Asthma with hay fever 06/15/2011   CAROTID BRUIT 01/08/2011   Headache 07/24/2010   Nonspecific (abnormal) findings on radiological and other examination of body structure 01/25/2010   ABNORMAL CHEST XRAY 01/25/2010   SHOULDER PAIN 01/06/2010   URTICARIA 12/27/2008   Allergic urticaria 12/15/2008   Asthma, cough variant 12/15/2008   ELEVATED BLOOD PRESSURE WITHOUT DIAGNOSIS OF HYPERTENSION 12/15/2008   Sinusitis 11/19/2007   HYPERLIPIDEMIA 09/19/2007   ANEMIA-IRON DEFICIENCY 09/19/2007   Anxiety disorder 09/19/2007   Allergic rhinitis 09/19/2007   OSTEOARTHRITIS 09/19/2007   RASH-NONVESICULAR 09/19/2007    Social History   Tobacco Use   Smoking status: Never   Smokeless tobacco: Never  Substance Use Topics   Alcohol use: Never    Current Outpatient Medications:    acetaminophen (TYLENOL) 500 MG tablet, Take 1,000 mg by mouth every 6 (six) hours as needed (for pain.)., Disp: , Rfl:    ALPRAZolam (XANAX) 0.25 MG tablet, Take 1 tablet (0.25 mg total) by mouth 2 (two) times daily as needed., Disp: 60 tablet, Rfl: 1   aspirin EC 81 MG tablet, Take  81 mg by mouth daily., Disp: , Rfl:    atorvastatin (LIPITOR) 10 MG tablet, Take 1 tablet (10 mg total) by mouth daily., Disp: 90 tablet, Rfl: 3   Cholecalciferol (VITAMIN D3) 2000 units TABS, Take 2,000 Units by mouth daily. , Disp: , Rfl:    Famotidine (PEPCID AC PO), Take 1 tablet by mouth daily in the afternoon., Disp: , Rfl:    losartan  (COZAAR) 50 MG tablet, Take 1 tablet (50 mg total) by mouth daily. (Patient taking differently: Take 25 mg by mouth daily.), Disp: 90 tablet, Rfl: 3   metroNIDAZOLE (METROCREAM) 0.75 % cream, Apply 1 application topically daily as needed (for rosacea)., Disp: 45 g, Rfl: 3   mometasone (NASONEX) 50 MCG/ACT nasal spray, Place 2 sprays into the nose daily. (Patient taking differently: Place 2 sprays into the nose as needed.), Disp: 17 g, Rfl: 3   montelukast (SINGULAIR) 10 MG tablet, Take 1 tablet (10 mg total) by mouth daily. (Patient taking differently: Take 10 mg by mouth as needed.), Disp: 90 tablet, Rfl: 3   nirmatrelvir/ritonavir EUA (PAXLOVID) 20 x 150 MG & 10 x '100MG'$  TABS, Take 3 tablets by mouth 2 (two) times daily for 5 days. (Take nirmatrelvir 150 mg two tablets twice daily for 5 days and ritonavir 100 mg one tablet twice daily for 5 days), Disp: 30 tablet, Rfl: 0   omeprazole (PRILOSEC) 20 MG capsule, Take 1 capsule (20 mg total) by mouth daily., Disp: 90 capsule, Rfl: 3   ondansetron (ZOFRAN) 4 MG tablet, Take 1 tablet (4 mg total) by mouth every 8 (eight) hours as needed for nausea or vomiting., Disp: 20 tablet, Rfl: 0   predniSONE (DELTASONE) 20 MG tablet, Take 2 tablets (40 mg total) by mouth daily., Disp: 10 tablet, Rfl: 0   Simethicone (GAS-X PO), Take 1 tablet by mouth daily in the afternoon., Disp: , Rfl:   Allergies  Allergen Reactions   Clarithromycin Hives   Food Color Red Hives   Sulfonamide Derivatives Hives   [place] Rash    Found on Allergy Test   Parabens Rash    Objective:   VITALS: Per patient if applicable, see vitals. GENERAL: Alert, appears well and in no acute distress. HEENT: Atraumatic, conjunctiva clear, no obvious abnormalities on inspection of external nose and ears. NECK: Normal movements of the head and neck. CARDIOPULMONARY: No increased WOB. Speaking in clear sentences. I:E ratio WNL.  MS: Moves all visible extremities without noticeable  abnormality. PSYCH: Pleasant and cooperative, well-groomed. Speech normal rate and rhythm. Affect is appropriate. Insight and judgement are appropriate. Attention is focused, linear, and appropriate.  NEURO: CN grossly intact. Oriented as arrived to appointment on time with no prompting. Moves both UE equally.  SKIN: No obvious lesions, wounds, erythema, or cyanosis noted on face or hands.  Assessment and Plan:   Jonny was seen today for covid positive.  Diagnoses and all orders for this visit:  COVID-19  Other orders -     nirmatrelvir/ritonavir EUA (PAXLOVID) 20 x 150 MG & 10 x '100MG'$  TABS; Take 3 tablets by mouth 2 (two) times daily for 5 days. (Take nirmatrelvir 150 mg two tablets twice daily for 5 days and ritonavir 100 mg one tablet twice daily for 5 days) -     predniSONE (DELTASONE) 20 MG tablet; Take 2 tablets (40 mg total) by mouth daily.    No red flags on discussion, patient is not in any obvious distress during our visit. Discussed progression of most  viral illnesses, and recommended supportive care at this point in time.  We will have her hold her lipitor and start paxlovid. She may resume lipitor 5 days after completion of paxlovid. Discussed over the counter supportive care options, including Tylenol 500 mg q 8 hours, with recommendations to push fluids and rest. I have also sent in prednisone should she have increased asthmatic sx, but do not feel like she will likely need this -- just sent as a safety net. Reviewed return precautions including new/worsening fever, SOB, new/worsening cough, sudden onset changes of symptoms. Recommended need to self-quarantine and practice social distancing until symptoms resolve. I recommend that patient follow-up if symptoms worsen or persist despite treatment x 7-10 days, sooner if needed.  I discussed the assessment and treatment plan with the patient. The patient was provided an opportunity to ask questions and all were answered.  The patient agreed with the plan and demonstrated an understanding of the instructions.   The patient was advised to call back or seek an in-person evaluation if the symptoms worsen or if the condition fails to improve as anticipated.    [place], [place] 09/03/2022

## 2022-10-11 ENCOUNTER — Other Ambulatory Visit: Payer: Self-pay | Admitting: Internal Medicine

## 2022-10-18 ENCOUNTER — Ambulatory Visit (INDEPENDENT_AMBULATORY_CARE_PROVIDER_SITE_OTHER): Payer: Medicare Other

## 2022-10-18 DIAGNOSIS — I441 Atrioventricular block, second degree: Secondary | ICD-10-CM

## 2022-10-18 LAB — CUP PACEART REMOTE DEVICE CHECK
Battery Remaining Longevity: 79 mo
Battery Voltage: 2.97 V
Brady Statistic AP VP Percent: 44.11 %
Brady Statistic AP VS Percent: 0 %
Brady Statistic AS VP Percent: 55.72 %
Brady Statistic AS VS Percent: 0.16 %
Brady Statistic RA Percent Paced: 44.12 %
Brady Statistic RV Percent Paced: 99.84 %
Date Time Interrogation Session: 20231109013056
Implantable Lead Connection Status: 753985
Implantable Lead Connection Status: 753985
Implantable Lead Implant Date: 20190801
Implantable Lead Implant Date: 20190801
Implantable Lead Location: 753859
Implantable Lead Location: 753860
Implantable Lead Model: 3830
Implantable Lead Model: 5076
Implantable Pulse Generator Implant Date: 20190801
Lead Channel Impedance Value: 304 Ohm
Lead Channel Impedance Value: 342 Ohm
Lead Channel Impedance Value: 361 Ohm
Lead Channel Impedance Value: 418 Ohm
Lead Channel Pacing Threshold Amplitude: 0.625 V
Lead Channel Pacing Threshold Amplitude: 0.75 V
Lead Channel Pacing Threshold Pulse Width: 0.4 ms
Lead Channel Pacing Threshold Pulse Width: 0.4 ms
Lead Channel Sensing Intrinsic Amplitude: 4.125 mV
Lead Channel Sensing Intrinsic Amplitude: 4.125 mV
Lead Channel Sensing Intrinsic Amplitude: 9.5 mV
Lead Channel Sensing Intrinsic Amplitude: 9.5 mV
Lead Channel Setting Pacing Amplitude: 2 V
Lead Channel Setting Pacing Amplitude: 2.5 V
Lead Channel Setting Pacing Pulse Width: 0.4 ms
Lead Channel Setting Sensing Sensitivity: 1.2 mV
Zone Setting Status: 755011
Zone Setting Status: 755011

## 2022-10-23 ENCOUNTER — Ambulatory Visit (INDEPENDENT_AMBULATORY_CARE_PROVIDER_SITE_OTHER): Payer: Medicare Other | Admitting: *Deleted

## 2022-10-23 DIAGNOSIS — Z23 Encounter for immunization: Secondary | ICD-10-CM | POA: Diagnosis not present

## 2022-10-26 NOTE — Progress Notes (Signed)
Remote pacemaker transmission.   

## 2022-10-31 NOTE — Telephone Encounter (Signed)
Hello,   Please see MyChart message from 10/18/2022. Please assist with issuing a new script for patient for losartan 25 mg take 1 tablet PO daily  Thank you,   Loretha Brasil, PharmD [place] Pharmacist Office: [phone]

## 2022-11-06 MED ORDER — LOSARTAN POTASSIUM 50 MG PO TABS: 50.0000 mg | ORAL_TABLET | Freq: Every day | ORAL | 3 refills | Status: DC

## 2022-12-17 ENCOUNTER — Other Ambulatory Visit: Payer: Self-pay | Admitting: Internal Medicine

## 2023-01-01 ENCOUNTER — Ambulatory Visit: Payer: Medicare Other | Attending: Cardiovascular Disease | Admitting: Cardiovascular Disease

## 2023-01-01 ENCOUNTER — Encounter: Payer: Self-pay | Admitting: Cardiovascular Disease

## 2023-01-01 VITALS — BP 130/86 | HR 71 | Ht 64.0 in | Wt 167.6 lb

## 2023-01-01 DIAGNOSIS — I493 Ventricular premature depolarization: Secondary | ICD-10-CM | POA: Diagnosis not present

## 2023-01-01 DIAGNOSIS — I441 Atrioventricular block, second degree: Secondary | ICD-10-CM

## 2023-01-01 NOTE — Patient Instructions (Signed)
Medication Instructions:  Your physician recommends that you continue on your current medications as directed. Please refer to the Current Medication list given to you today.  *If you need a refill on your cardiac medications before your next appointment, please call your pharmacy*  Lab Work: None ordered If you have labs (blood work) drawn today and your tests are completely normal, you will receive your results only by: [place] (if you have MyChart) OR A paper copy in the mail If you have any lab test that is abnormal or we need to change your treatment, we will call you to review the results.  Testing/Procedures: None ordered  Follow-Up: At [facility], you and your health needs are our priority.  As part of our continuing mission to provide you with exceptional heart care, we have created designated Provider Care Teams.  These Care Teams include your primary Cardiologist (physician) and Advanced Practice Providers (APPs -  Physician Assistants and Nurse Practitioners) who all work together to provide you with the care you need, when you need it.  Your next appointment:   1 year(s)  The format for your next appointment:   In Person  Provider:   You will see one of the following Advanced Practice Providers on your designated Care Team:   Tommye Standard, [place]" [place], [place], NP

## 2023-01-01 NOTE — Progress Notes (Signed)
PCP: Cassandria Anger, MD Primary Cardiologist: Dr Ellyn Hack Primary EP:  Dr Myles Gip  Anna Ellis is a 72 y.o. female who presents today for routine electrophysiology followup.  Since last being seen in our clinic, the patient reports doing very well.  Today, she denies symptoms of palpitations, chest pain, shortness of breath,  lower extremity edema, dizziness, presyncope, or syncope.  The patient is otherwise without complaint today.   she has no device related complaints -- no new tenderness, drainage, redness.   Past Medical History:  Diagnosis Date   ALLERGIC RHINITIS 09/19/2007   ANEMIA-IRON DEFICIENCY 09/19/2007   ANXIETY 09/19/2007   Arthritis    "neck" (07/10/2018)   Basal cell cancer    "back of my neck; scraped it off"   CAROTID BRUIT 01/08/2011   Chronic headaches    GERD (gastroesophageal reflux disease)    High blood pressure    History of gallstones    Hyperlipidemia    Mobitz II    a. s/p MDT dual chamber (His Bundle) pacemaker 07/2018 - Dr Rayann Heman   OSTEOARTHRITIS 09/19/2007   Pneumonia    Past Surgical History:  Procedure Laterality Date   ANTERIOR CRUCIATE LIGAMENT REPAIR Left 2001   "donor ACL"   BREAST LUMPECTOMY Bilateral 2002   "both benign"   COLONOSCOPY  09/12/2017   Dr Earlean Shawl. have diverticulosis   ESOPHAGOGASTRODUODENOSCOPY  [place] Left 1986   w/ACL repair   LAPAROSCOPIC CHOLECYSTECTOMY  1994   LEFT HEART CATH AND CORONARY ANGIOGRAPHY N/A 07/08/2018   Procedure: LEFT HEART CATH AND CORONARY ANGIOGRAPHY;  Surgeon: Leonie Man, MD;  Location: [facility];  Service: Cardiovascular;; Angiographically normal coronary arteries except ~20-25% prox & mid LAD.  Normal LVEDP.   PACEMAKER IMPLANT N/A 07/10/2018   Medtronic Azure XT DR MRI SureScan model [id number] (serial number [id number]) pacemaker by Dr Rayann Heman with His bundle lead   [place]   UPPER GI ENDOSCOPY  06/28/2022   VAGINAL HYSTERECTOMY  1990   WISDOM TOOTH EXTRACTION  1967    ROS- all systems are reviewed and negative except as per HPI above  Current Outpatient Medications  Medication Sig Dispense Refill   acetaminophen (TYLENOL) 500 MG tablet Take 1,000 mg by mouth every 6 (six) hours as needed (for pain.).     ALPRAZolam (XANAX) 0.25 MG tablet Take 1 tablet (0.25 mg total) by mouth 2 (two) times daily as needed. 60 tablet 1   aspirin EC 81 MG tablet Take 81 mg by mouth daily.     atorvastatin (LIPITOR) 10 MG tablet Take 1 tablet (10 mg total) by mouth daily. Annual appt due in January must see provider for future refills 90 tablet 0   cholecalciferol (VITAMIN D3) 25 MCG (1000 UNIT) tablet Take 1,000 Units by mouth daily.     Famotidine (PEPCID AC PO) Take 1 tablet by mouth daily in the afternoon.     losartan (COZAAR) 50 MG tablet Take 1 tablet (50 mg total) by mouth daily. 90 tablet 3   metroNIDAZOLE (METROCREAM) 0.75 % cream Apply 1 application topically daily as needed (for rosacea). 45 g 3   mometasone (NASONEX) 50 MCG/ACT nasal spray Place 2 sprays into the nose daily. (Patient taking differently: Place 2 sprays into the nose as needed.) 17 g 3  montelukast (SINGULAIR) 10 MG tablet Take 1 tablet (10 mg total) by mouth daily. (Patient taking differently: Take 10 mg by mouth as needed.) 90 tablet 3   omeprazole (PRILOSEC) 20 MG capsule Take 1 capsule (20 mg total) by mouth daily. 90 capsule 3   ondansetron (ZOFRAN) 4 MG tablet Take 1 tablet (4 mg total) by mouth every 8 (eight) hours as needed for nausea or vomiting. 20 tablet 0   Simethicone (GAS-X PO) Take 1 tablet by mouth daily in the afternoon.     predniSONE (DELTASONE) 20 MG tablet Take 2 tablets (40 mg total) by mouth daily. 10 tablet 0   No current facility-administered medications for this visit.    Physical Exam: Vitals:   01/01/23 1057  BP: 130/86   Pulse: 71  SpO2: 100%  Weight: 167 lb 9.6 oz (76 kg)  Height: '5\' 4"'$  (1.626 m)     Gen: Appears comfortable, well-nourished CV: RRR, no dependent edema The device site is normal -- no tenderness, edema, drainage, redness, threatened erosion. Pulm: breathing easily   Pacemaker interrogation- reviewed in detail today,  See PACEART report   Assessment and Plan:  1. Symptomatic second degree heart block Normal pacemaker function See Pace Art report No changes today she is not device dependant today  2. HTN Stable No change equired today  Return in a year to see EP APP  Melida Quitter, MD 01/01/2023 11:14 AM

## 2023-01-07 ENCOUNTER — Ambulatory Visit (INDEPENDENT_AMBULATORY_CARE_PROVIDER_SITE_OTHER): Payer: Medicare Other | Admitting: Internal Medicine

## 2023-01-07 ENCOUNTER — Encounter: Payer: Self-pay | Admitting: Internal Medicine

## 2023-01-07 VITALS — BP 128/76 | HR 59 | Temp 98.0°F | Ht 64.0 in | Wt 166.0 lb

## 2023-01-07 DIAGNOSIS — I441 Atrioventricular block, second degree: Secondary | ICD-10-CM

## 2023-01-07 DIAGNOSIS — Z0000 Encounter for general adult medical examination without abnormal findings: Secondary | ICD-10-CM

## 2023-01-07 DIAGNOSIS — I10 Essential (primary) hypertension: Secondary | ICD-10-CM

## 2023-01-07 DIAGNOSIS — E785 Hyperlipidemia, unspecified: Secondary | ICD-10-CM

## 2023-01-07 DIAGNOSIS — F419 Anxiety disorder, unspecified: Secondary | ICD-10-CM | POA: Diagnosis not present

## 2023-01-07 DIAGNOSIS — Z23 Encounter for immunization: Secondary | ICD-10-CM

## 2023-01-07 LAB — COMPREHENSIVE METABOLIC PANEL
ALT: 14 U/L (ref 0–35)
AST: 19 U/L (ref 0–37)
Albumin: 4.5 g/dL (ref 3.5–5.2)
Alkaline Phosphatase: 65 U/L (ref 39–117)
BUN: 13 mg/dL (ref 6–23)
CO2: 27 mEq/L (ref 19–32)
Calcium: 9.6 mg/dL (ref 8.4–10.5)
Chloride: 104 mEq/L (ref 96–112)
Creatinine, Ser: 0.69 mg/dL (ref 0.40–1.20)
GFR: 86.02 mL/min (ref >60.00)
Glucose, Bld: 95 mg/dL (ref 70–99)
Potassium: 4.1 mEq/L (ref 3.5–5.1)
Sodium: 139 mEq/L (ref 135–145)
Total Bilirubin: 0.5 mg/dL (ref 0.2–1.2)
Total Protein: 7 g/dL (ref 6.0–8.3)

## 2023-01-07 LAB — TSH: TSH: 2.32 u[IU]/mL (ref 0.35–5.50)

## 2023-01-07 LAB — URINALYSIS
Bilirubin Urine: NEGATIVE
Ketones, ur: NEGATIVE
Leukocytes,Ua: NEGATIVE
Nitrite: NEGATIVE
Specific Gravity, Urine: <=1.005 — AB (ref 1.000–1.030)
Total Protein, Urine: NEGATIVE
Urine Glucose: NEGATIVE
Urobilinogen, UA: 0.2 (ref 0.0–1.0)
pH: 6 (ref 5.0–8.0)

## 2023-01-07 LAB — CBC WITH DIFFERENTIAL/PLATELET
Basophils Absolute: 0 10*3/uL (ref 0.0–0.1)
Basophils Relative: 0.7 % (ref 0.0–3.0)
Eosinophils Absolute: 0.2 10*3/uL (ref 0.0–0.7)
Eosinophils Relative: 3.2 % (ref 0.0–5.0)
HCT: 42.6 % (ref 36.0–46.0)
Hemoglobin: 14.7 g/dL (ref 12.0–15.0)
Lymphocytes Relative: 22.1 % (ref 12.0–46.0)
Lymphs Abs: 1.3 10*3/uL (ref 0.7–4.0)
MCHC: 34.4 g/dL (ref 30.0–36.0)
MCV: 94.1 fl (ref 78.0–100.0)
Monocytes Absolute: 0.6 10*3/uL (ref 0.1–1.0)
Monocytes Relative: 9.7 % (ref 3.0–12.0)
Neutro Abs: 3.8 10*3/uL (ref 1.4–7.7)
Neutrophils Relative %: 64.3 % (ref 43.0–77.0)
Platelets: 323 10*3/uL (ref 150.0–400.0)
RBC: 4.52 Mil/uL (ref 3.87–5.11)
RDW: 13.5 % (ref 11.5–15.5)
WBC: 5.9 10*3/uL (ref 4.0–10.5)

## 2023-01-07 LAB — LIPID PANEL
Cholesterol: 172 mg/dL (ref 0–200)
HDL: 63 mg/dL (ref >39.00)
LDL Cholesterol: 88 mg/dL (ref 0–99)
NonHDL: 109.24
Total CHOL/HDL Ratio: 3
Triglycerides: 107 mg/dL (ref 0.0–149.0)
VLDL: 21.4 mg/dL (ref 0.0–40.0)

## 2023-01-07 MED ORDER — PREDNISONE 10 MG (21) PO TBPK: ORAL_TABLET | ORAL | 0 refills | Status: DC

## 2023-01-07 MED ORDER — ALPRAZOLAM 0.25 MG PO TABS: 0.2500 mg | ORAL_TABLET | Freq: Two times a day (BID) | ORAL | 1 refills | Status: DC | PRN

## 2023-01-07 MED ORDER — LOSARTAN POTASSIUM 25 MG PO TABS: 25.0000 mg | ORAL_TABLET | Freq: Every day | ORAL | 3 refills | Status: DC

## 2023-01-07 MED ORDER — MONTELUKAST SODIUM 10 MG PO TABS: 10.0000 mg | ORAL_TABLET | Freq: Every day | ORAL | 3 refills | Status: DC

## 2023-01-07 MED ORDER — CEFUROXIME AXETIL 500 MG PO TABS: 500.0000 mg | ORAL_TABLET | Freq: Two times a day (BID) | ORAL | 0 refills | Status: DC

## 2023-01-07 MED ORDER — ATORVASTATIN CALCIUM 10 MG PO TABS: 10.0000 mg | ORAL_TABLET | Freq: Every day | ORAL | 3 refills | Status: DC

## 2023-01-07 NOTE — Assessment & Plan Note (Signed)
On Losartan 25 mg po

## 2023-01-07 NOTE — Progress Notes (Signed)
Subjective:  Patient ID: Anna Ellis, female    DOB: 1951/05/27  Age: 72 y.o. MRN: [id number]  CC: Annual Exam   HPI Anna Ellis presents for a well exam  Outpatient Medications Prior to Visit  Medication Sig Dispense Refill   acetaminophen (TYLENOL) 500 MG tablet Take 1,000 mg by mouth every 6 (six) hours as needed (for pain.).     aspirin EC 81 MG tablet Take 81 mg by mouth daily.     cholecalciferol (VITAMIN D3) 25 MCG (1000 UNIT) tablet Take 1,000 Units by mouth daily.     Famotidine (PEPCID AC PO) Take 1 tablet by mouth daily in the afternoon.     metroNIDAZOLE (METROCREAM) 0.75 % cream Apply 1 application topically daily as needed (for rosacea). 45 g 3   mometasone (NASONEX) 50 MCG/ACT nasal spray Place 2 sprays into the nose daily. (Patient taking differently: Place 2 sprays into the nose as needed.) 17 g 3   omeprazole (PRILOSEC) 20 MG capsule Take 1 capsule (20 mg total) by mouth daily. 90 capsule 3   ondansetron (ZOFRAN) 4 MG tablet Take 1 tablet (4 mg total) by mouth every 8 (eight) hours as needed for nausea or vomiting. 20 tablet 0   Simethicone (GAS-X PO) Take 1 tablet by mouth daily in the afternoon.     ALPRAZolam (XANAX) 0.25 MG tablet Take 1 tablet (0.25 mg total) by mouth 2 (two) times daily as needed. 60 tablet 1   atorvastatin (LIPITOR) 10 MG tablet Take 1 tablet (10 mg total) by mouth daily. Annual appt due in January must see provider for future refills 90 tablet 0   losartan (COZAAR) 50 MG tablet Take 1 tablet (50 mg total) by mouth daily. 90 tablet 3   montelukast (SINGULAIR) 10 MG tablet Take 1 tablet (10 mg total) by mouth daily. (Patient taking differently: Take 10 mg by mouth as needed.) 90 tablet 3   predniSONE (DELTASONE) 20 MG tablet Take 2 tablets (40 mg total) by mouth daily. 10 tablet 0   No facility-administered medications prior to visit.    ROS: Review of Systems  Constitutional:  Negative for activity change, appetite change, chills,  fatigue and unexpected weight change.  HENT:  Negative for congestion, mouth sores and sinus pressure.   Eyes:  Negative for visual disturbance.  Respiratory:  Negative for cough and chest tightness.   Gastrointestinal:  Negative for abdominal pain and nausea.  Genitourinary:  Negative for difficulty urinating, frequency and vaginal pain.  Musculoskeletal:  Negative for back pain and gait problem.  Skin:  Negative for pallor and rash.  Neurological:  Negative for dizziness, tremors, weakness, numbness and headaches.  Psychiatric/Behavioral:  Negative for confusion and sleep disturbance.     Objective:  BP 128/76 (BP Location: Left Arm, Patient Position: Sitting, Cuff Size: Normal)   Pulse (!) 59   Temp 98 F (36.7 C) (Oral)   Ht '5\' 4"'$  (1.626 m)   Wt 166 lb (75.3 kg)   SpO2 98%   BMI 28.49 kg/m   BP Readings from Last 3 Encounters:  01/07/23 128/76  01/01/23 130/86  08/14/22 128/80    Wt Readings from Last 3 Encounters:  01/07/23 166 lb (75.3 kg)  01/01/23 167 lb 9.6 oz (76 kg)  09/03/22 167 lb (75.8 kg)    Physical Exam Constitutional:      General: She is not in acute distress.    Appearance: She is well-developed.  HENT:     Head:  Normocephalic.     Right Ear: External ear normal.     Left Ear: External ear normal.     Nose: Nose normal.  Eyes:     General:        Right eye: No discharge.        Left eye: No discharge.     Conjunctiva/sclera: Conjunctivae normal.     Pupils: Pupils are equal, round, and reactive to light.  Neck:     Thyroid: No thyromegaly.     Vascular: No JVD.     Trachea: No tracheal deviation.  Cardiovascular:     Rate and Rhythm: Normal rate and regular rhythm.     Heart sounds: Normal heart sounds.  Pulmonary:     Effort: No respiratory distress.     Breath sounds: No stridor. No wheezing.  Abdominal:     General: Bowel sounds are normal. There is no distension.     Palpations: Abdomen is soft. There is no mass.     Tenderness:  There is no abdominal tenderness. There is no guarding or rebound.  Musculoskeletal:        General: No tenderness.     Cervical back: Normal range of motion and neck supple. No rigidity.  Lymphadenopathy:     Cervical: No cervical adenopathy.  Skin:    Findings: No erythema or rash.  Neurological:     Cranial Nerves: No cranial nerve deficit.     Motor: No abnormal muscle tone.     Coordination: Coordination normal.     Deep Tendon Reflexes: Reflexes normal.  Psychiatric:        Behavior: Behavior normal.        Thought Content: Thought content normal.        Judgment: Judgment normal.     Lab Results  Component Value Date   WBC 5.0 05/01/2022   HGB 14.4 05/01/2022   HCT 42.2 05/01/2022   PLT 330.0 05/01/2022   GLUCOSE 95 05/01/2022   CHOL 158 01/03/2022   TRIG 95.0 01/03/2022   HDL 54.10 01/03/2022   LDLCALC 85 01/03/2022   ALT 16 05/01/2022   AST 19 05/01/2022   NA 140 05/01/2022   K 4.1 05/01/2022   CL 105 05/01/2022   CREATININE 0.73 05/01/2022   BUN 17 05/01/2022   CO2 29 05/01/2022   TSH 2.69 01/03/2022    CT Abdomen Pelvis W Contrast  Result Date: 08/25/2022 CLINICAL DATA:  RUQ pain, duodenal stricture, gastritis EXAM: CT ABDOMEN AND PELVIS WITH CONTRAST TECHNIQUE: Multidetector CT imaging of the abdomen and pelvis was performed using the standard protocol following bolus administration of intravenous contrast. RADIATION DOSE REDUCTION: This exam was performed according to the departmental dose-optimization program which includes automated exposure control, adjustment of the mA and/or kV according to patient size and/or use of iterative reconstruction technique. CONTRAST:  1m OMNIPAQUE IOHEXOL 350 MG/ML SOLN COMPARISON:  Chest x-ray 07/11/2018 FINDINGS: Lower chest: Cardiac leads partially visualized. Possible tiny hiatal hernia. No acute abnormality. Hepatobiliary: Subcentimeter hypodensities too small to characterize. Otherwise no focal liver abnormality.  Status post cholecystectomy. No biliary dilatation. Pancreas: Intercalation of fat within the tail of the pancreas (6:59). No focal lesion. Normal pancreatic contour. No surrounding inflammatory changes. No main pancreatic ductal dilatation. Spleen: Normal in size without focal abnormality. Adrenals/Urinary Tract: No adrenal nodule bilaterally. Bilateral kidneys enhance symmetrically. Subcentimeter hypodensities too small to characterize. Left nephrolithiasis measuring up to 3 mm. No right nephrolithiasis. No ureterolithiasis bilaterally. No hydronephrosis. No hydroureter. The urinary  bladder is unremarkable. On delayed imaging, there is no urothelial wall thickening and there are no filling defects in the opacified portions of the bilateral collecting systems or ureters. Stomach/Bowel: Stomach is within normal limits. No evidence of bowel wall thickening or dilatation. Sigmoid diverticulosis. Appendix appears normal. Vascular/Lymphatic: No abdominal aorta or iliac aneurysm. Mild atherosclerotic plaque of the aorta and its branches. No abdominal, pelvic, or inguinal lymphadenopathy. Reproductive: Status post hysterectomy. No adnexal masses. Other: No intraperitoneal free fluid. No intraperitoneal free gas. No organized fluid collection. Musculoskeletal: No abdominal wall hernia or abnormality. No suspicious lytic or blastic osseous lesions. No acute displaced fracture. Multilevel degenerative changes of the spine. IMPRESSION: 1. Possible tiny hiatal hernia. 2. Nonobstructive left nephrolithiasis measuring up to 3 mm. 3. Sigmoid diverticulosis with no acute diverticulitis. 4.  Aortic Atherosclerosis (ICD10-I70.0). Electronically Signed   By: Iven Finn M.D.   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)   Relevant Medications   atorvastatin (LIPITOR) 10 MG tablet   losartan (COZAAR) 25 MG  tablet   Other Relevant Orders   TSH   Urinalysis   CBC with Differential/Platelet   Lipid panel   Comprehensive metabolic panel   HTN (hypertension)    On Losartan 25 mg po      Relevant Medications   atorvastatin (LIPITOR) 10 MG tablet   losartan (COZAAR) 25 MG tablet   Other Relevant Orders   TSH   Urinalysis   CBC with Differential/Platelet   Lipid panel   Comprehensive metabolic panel     Other   Anxiety disorder   Relevant Medications   ALPRAZolam (XANAX) 0.25 MG tablet   Other Relevant Orders   TSH   Urinalysis   CBC with Differential/Platelet   Lipid panel   Comprehensive metabolic panel   Well adult exam - Primary   Other Visit Diagnoses     Dyslipidemia       Relevant Medications   atorvastatin (LIPITOR) 10 MG tablet   Other Relevant Orders   TSH   Lipid panel         Meds ordered this encounter  Medications   atorvastatin (LIPITOR) 10 MG tablet    Sig: Take 1 tablet (10 mg total) by mouth daily. Annual appt due in January must see provider for future refills    Dispense:  90 tablet    Refill:  3    Please send a replace/new response with 90-Day Supply if appropriate to maximize member benefit. Requesting 1 year supply.   losartan (COZAAR) 25 MG tablet    Sig: Take 1 tablet (25 mg total) by mouth daily.    Dispense:  90 tablet    Refill:  3   montelukast (SINGULAIR) 10 MG tablet    Sig: Take 1 tablet (10 mg total) by mouth daily.    Dispense:  90 tablet    Refill:  3   ALPRAZolam (XANAX) 0.25 MG tablet    Sig: Take 1 tablet (0.25 mg total) by mouth 2 (two) times daily as needed.    Dispense:  60 tablet    Refill:  1   cefUROXime (CEFTIN) 500 MG tablet    Sig: Take 1 tablet (500 mg total) by mouth 2 (two) times daily.    Dispense:  20 tablet    Refill:  0   predniSONE (STERAPRED UNI-PAK  21 TAB) 10 MG (21) TBPK tablet    Sig: Take 6 tablets x 1 day, 5 tablets x 1 day, 4 tablets x 1 day, 3 tablets x 1 day, 2 tablets x 1 day, 1 tablet x 1  day    Dispense:  21 tablet    Refill:  0      Follow-up: Return in about 3 months (around 04/08/2023) for a follow-up visit.  Walker Kehr, MD

## 2023-01-07 NOTE — Addendum Note (Signed)
Addended by: Basil Dess on: 01/07/2023 11:43 AM   Modules accepted: Orders

## 2023-01-17 ENCOUNTER — Ambulatory Visit (INDEPENDENT_AMBULATORY_CARE_PROVIDER_SITE_OTHER): Payer: Medicare Other

## 2023-01-17 DIAGNOSIS — I441 Atrioventricular block, second degree: Secondary | ICD-10-CM | POA: Diagnosis not present

## 2023-01-17 LAB — CUP PACEART REMOTE DEVICE CHECK
Battery Remaining Longevity: 75 mo
Battery Voltage: 2.97 V
Brady Statistic AP VP Percent: 45 %
Brady Statistic AP VS Percent: 0 %
Brady Statistic AS VP Percent: 54.81 %
Brady Statistic AS VS Percent: 0.19 %
Brady Statistic RA Percent Paced: 44.99 %
Brady Statistic RV Percent Paced: 99.81 %
Date Time Interrogation Session: 20240208022526
Implantable Lead Connection Status: 753985
Implantable Lead Connection Status: 753985
Implantable Lead Implant Date: 20190801
Implantable Lead Implant Date: 20190801
Implantable Lead Location: 753859
Implantable Lead Location: 753860
Implantable Lead Model: 3830
Implantable Lead Model: 5076
Implantable Pulse Generator Implant Date: 20190801
Lead Channel Impedance Value: 342 Ohm
Lead Channel Impedance Value: 380 Ohm
Lead Channel Impedance Value: 380 Ohm
Lead Channel Impedance Value: 456 Ohm
Lead Channel Pacing Threshold Amplitude: 0.625 V
Lead Channel Pacing Threshold Amplitude: 0.875 V
Lead Channel Pacing Threshold Pulse Width: 0.4 ms
Lead Channel Pacing Threshold Pulse Width: 0.4 ms
Lead Channel Sensing Intrinsic Amplitude: 4.625 mV
Lead Channel Sensing Intrinsic Amplitude: 4.625 mV
Lead Channel Sensing Intrinsic Amplitude: 4.625 mV
Lead Channel Sensing Intrinsic Amplitude: 5.125 mV
Lead Channel Setting Pacing Amplitude: 2 V
Lead Channel Setting Pacing Amplitude: 2.5 V
Lead Channel Setting Pacing Pulse Width: 0.4 ms
Lead Channel Setting Sensing Sensitivity: 1.2 mV
Zone Setting Status: 755011
Zone Setting Status: 755011

## 2023-02-08 NOTE — Progress Notes (Signed)
Remote pacemaker transmission.   

## 2023-03-04 IMAGING — US US ABDOMEN LIMITED
1 series · 14 of 25 positions shown · non-contrast
Comparison: None Available.

CLINICAL DATA: Right upper quadrant pain

EXAM:
ULTRASOUND ABDOMEN LIMITED RIGHT UPPER QUADRANT

[Series 1: us abdomen limited · 0.23mm/px · 14 of 38 slices shown]
[im 1/38]
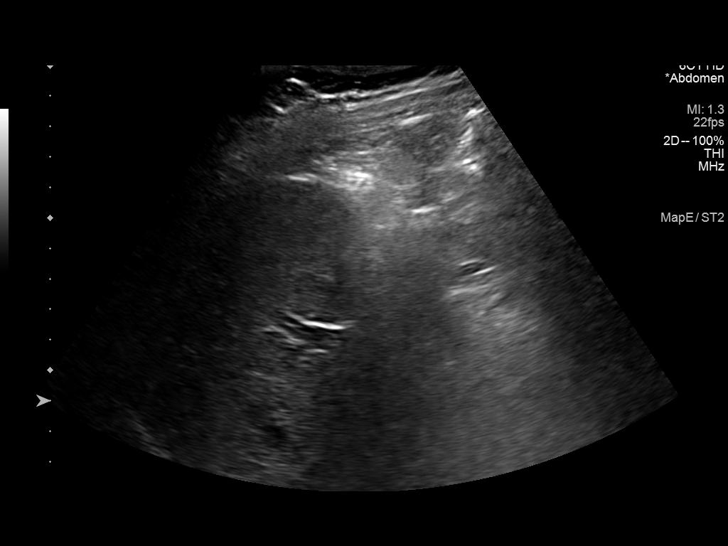
[im 4/38]
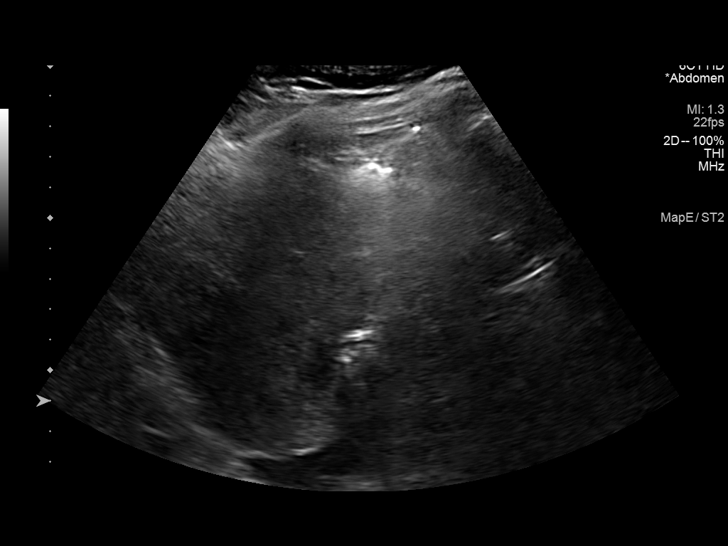
[im 7/38]
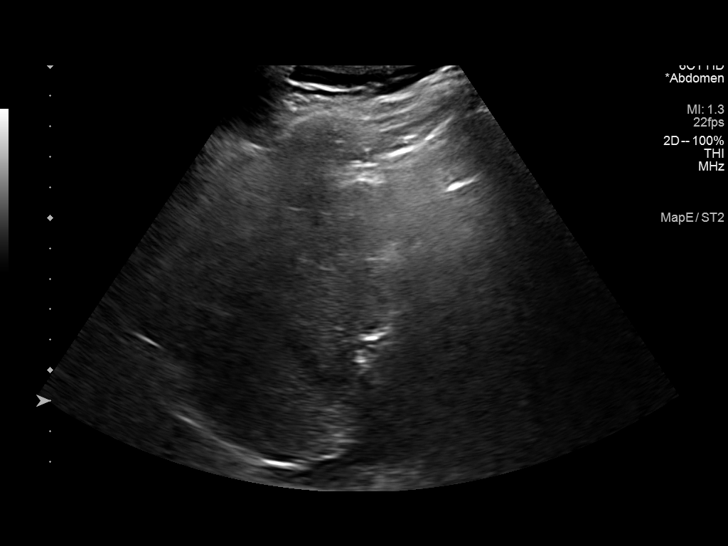
[im 10/38]
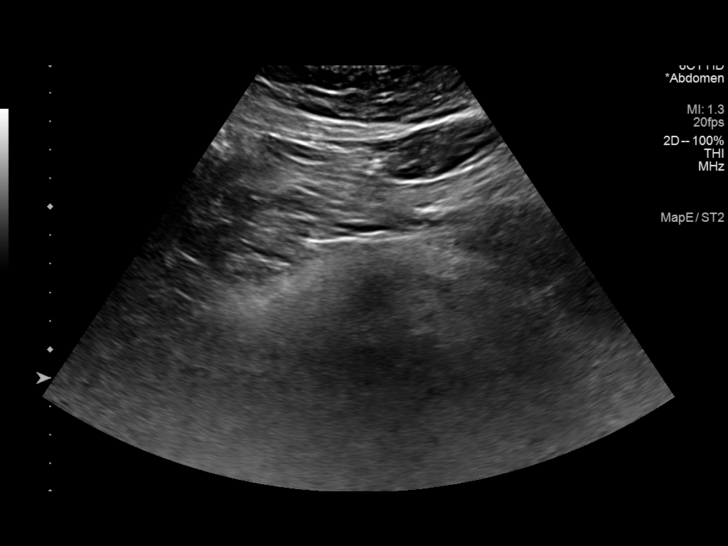
[im 13/38]
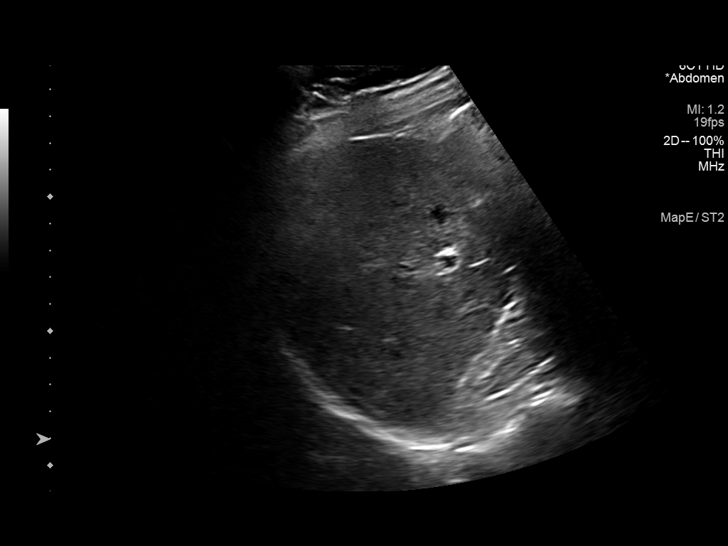
[im 14/38]
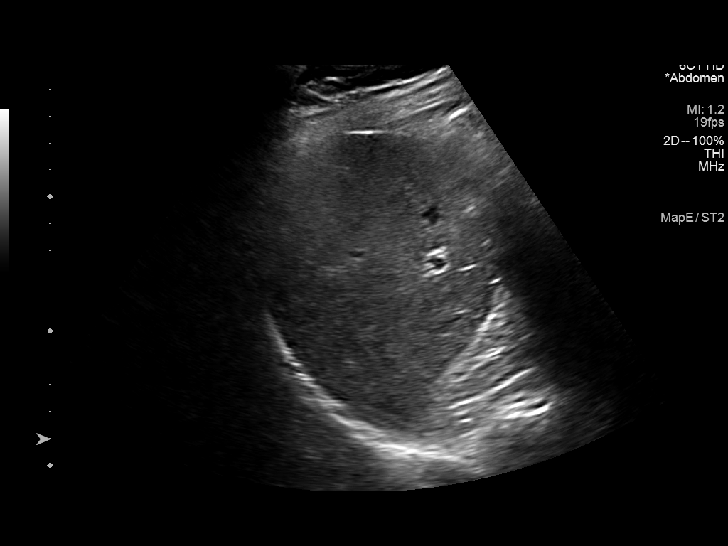
[im 17/38]
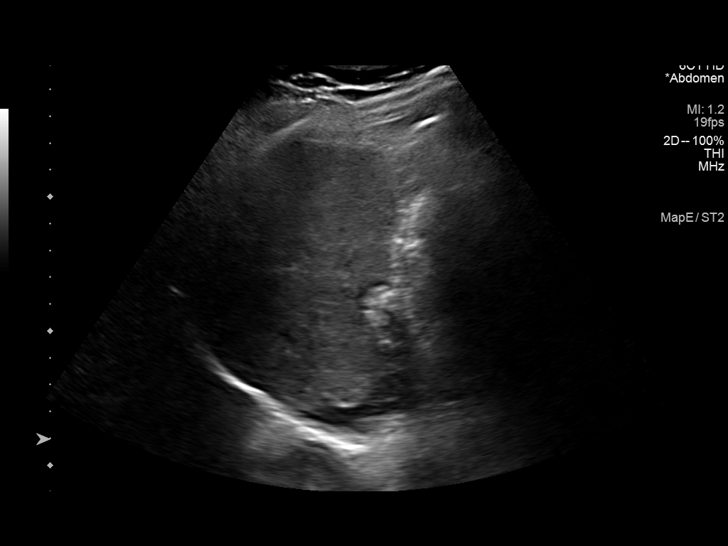
[im 21/38]
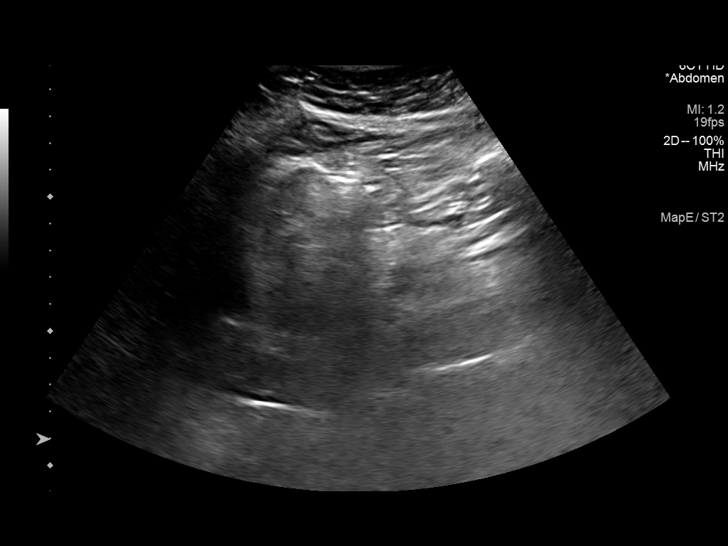
[im 24/38]
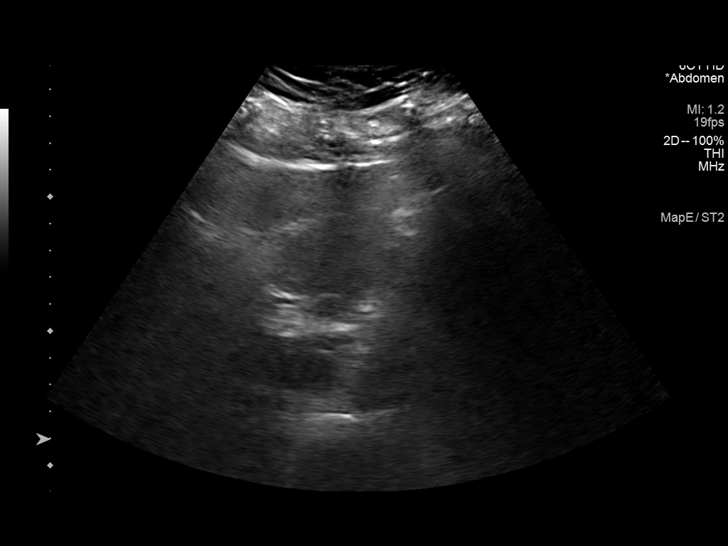
[im 25/38]
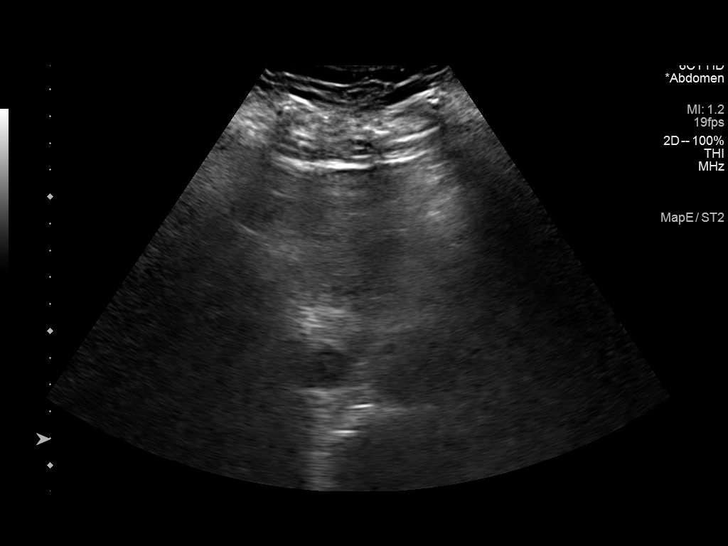
[im 28/38]
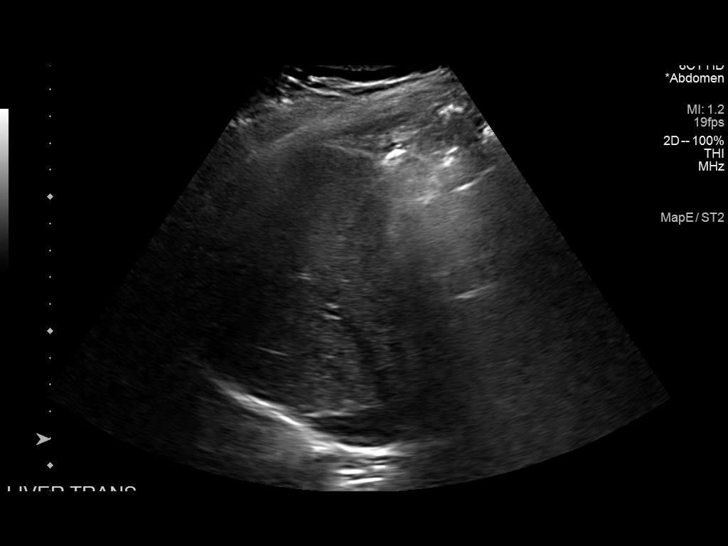
[im 31/38]
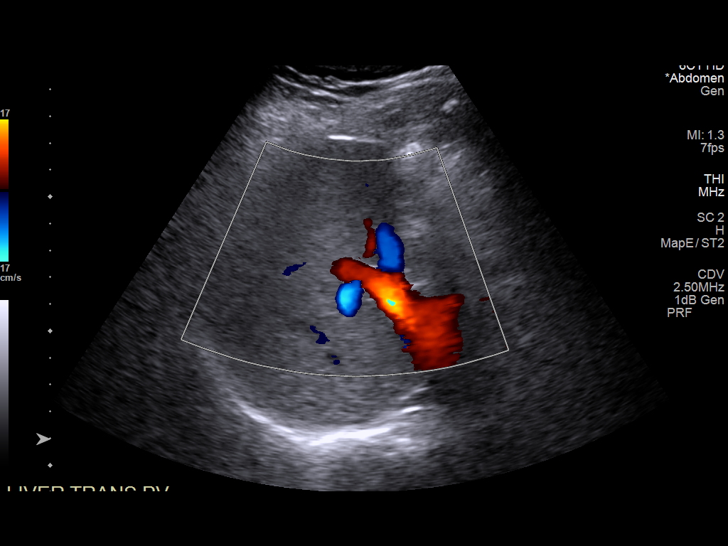
[im 34/38]
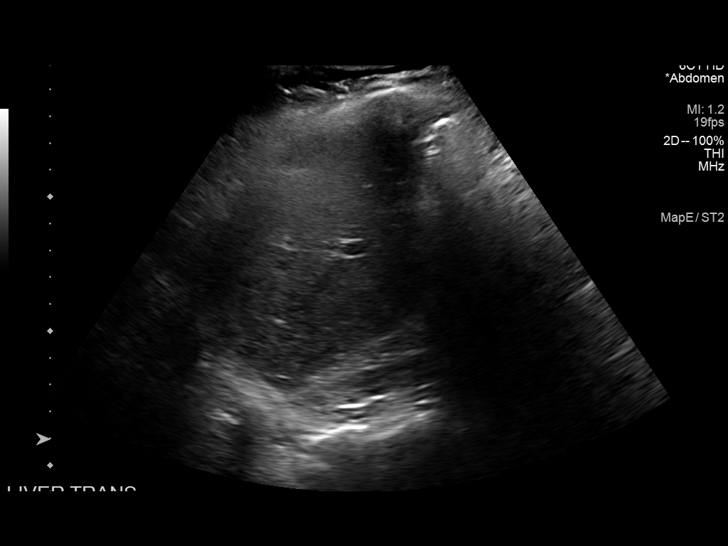
[im 38/38]
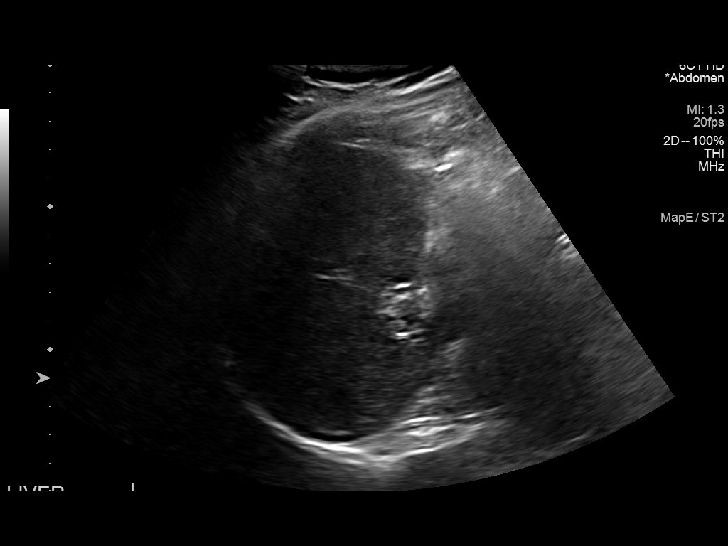

[14 of 25 positions shown; findings below may reference images not displayed]

FINDINGS: Gallbladder:

Surgically absent

Common bile duct:

Diameter: Not well visualized, does not appear dilated.

Liver:

Limited visualization due to bowel gas. Parenchyma appears mildly
inhomogeneous with no focal mass identified. Portal vein is patent
on color Doppler imaging with normal direction of blood flow towards
the liver.

Other: None.
IMPRESSION: Limited study due to bowel gas. No biliary ductal dilatation or
liver mass identified.

## 2023-03-19 LAB — HM MAMMOGRAPHY

## 2023-04-18 ENCOUNTER — Ambulatory Visit (INDEPENDENT_AMBULATORY_CARE_PROVIDER_SITE_OTHER): Payer: Medicare Other

## 2023-04-18 DIAGNOSIS — I441 Atrioventricular block, second degree: Secondary | ICD-10-CM | POA: Diagnosis not present

## 2023-04-18 LAB — CUP PACEART REMOTE DEVICE CHECK
Battery Remaining Longevity: 71 mo
Battery Voltage: 2.97 V
Brady Statistic AP VP Percent: 43.07 %
Brady Statistic AP VS Percent: 0 %
Brady Statistic AS VP Percent: 56.72 %
Brady Statistic AS VS Percent: 0.21 %
Brady Statistic RA Percent Paced: 43.07 %
Brady Statistic RV Percent Paced: 99.79 %
Date Time Interrogation Session: 20240508200547
Implantable Lead Connection Status: 753985
Implantable Lead Connection Status: 753985
Implantable Lead Implant Date: 20190801
Implantable Lead Implant Date: 20190801
Implantable Lead Location: 753859
Implantable Lead Location: 753860
Implantable Lead Model: 3830
Implantable Lead Model: 5076
Implantable Pulse Generator Implant Date: 20190801
Lead Channel Impedance Value: 323 Ohm
Lead Channel Impedance Value: 323 Ohm
Lead Channel Impedance Value: 361 Ohm
Lead Channel Impedance Value: 418 Ohm
Lead Channel Pacing Threshold Amplitude: 0.75 V
Lead Channel Pacing Threshold Amplitude: 0.75 V
Lead Channel Pacing Threshold Pulse Width: 0.4 ms
Lead Channel Pacing Threshold Pulse Width: 0.4 ms
Lead Channel Sensing Intrinsic Amplitude: 3.75 mV
Lead Channel Sensing Intrinsic Amplitude: 3.75 mV
Lead Channel Sensing Intrinsic Amplitude: 4.625 mV
Lead Channel Sensing Intrinsic Amplitude: 5.125 mV
Lead Channel Setting Pacing Amplitude: 2 V
Lead Channel Setting Pacing Amplitude: 2.5 V
Lead Channel Setting Pacing Pulse Width: 0.4 ms
Lead Channel Setting Sensing Sensitivity: 1.2 mV
Zone Setting Status: 755011
Zone Setting Status: 755011

## 2023-05-08 NOTE — Progress Notes (Signed)
Remote pacemaker transmission.   

## 2023-05-29 ENCOUNTER — Telehealth: Payer: Self-pay | Admitting: Cardiovascular Disease

## 2023-05-29 NOTE — Telephone Encounter (Signed)
Transmission received and WNL.  Outreach made to Pt.  Discussed need to rehydrate with electrolyte drinks when the weather is this hot.  Pt states she was only drinking water when she started to notice her vision was getting hazy.  She went to get in the shade when the next thing she knew the EMS was over her.  She has recovered now.   Her husband encouraged her to call.    Pt indicates understanding of education.  No further action needed.

## 2023-05-29 NOTE — Telephone Encounter (Signed)
Spoke with pt and was told to call due to passing out today Pt does have [employer] PPM .EMS told pt she was dehydrated Pt is going to send device recording today Will forward to Dr Nelly Laurence for review and recommendations ./cy

## 2023-05-29 NOTE — Telephone Encounter (Signed)
Pt c/o Syncope: STAT if syncope occurred within 30 minutes and pt complains of lightheadedness High Priority if episode of passing out, completely, today or in last 24 hours   Did you pass out today? Yes. Pt states this occurred today at a ball game. EMS was present at the game and consulted with her, they told her they assumed she was just dehydrated. She states they were unable to feel her pulse very well because it was weak.   When is the last time you passed out? Today    Has this occurred multiple times? Yes today    Did you have any symptoms prior to passing out? She states she told her husband she felt "hot and hazy." She states she went to go stand in the shade and passed out at the fence. Please advise.

## 2023-05-31 ENCOUNTER — Encounter: Payer: Self-pay | Admitting: Internal Medicine

## 2023-06-03 ENCOUNTER — Ambulatory Visit (INDEPENDENT_AMBULATORY_CARE_PROVIDER_SITE_OTHER): Payer: Medicare Other | Admitting: Family Medicine

## 2023-06-03 VITALS — BP 148/86 | HR 78 | Temp 97.9°F | Resp 20 | Ht 64.0 in | Wt 170.0 lb

## 2023-06-03 DIAGNOSIS — T671XXA Heat syncope, initial encounter: Secondary | ICD-10-CM | POA: Diagnosis not present

## 2023-06-03 DIAGNOSIS — E785 Hyperlipidemia, unspecified: Secondary | ICD-10-CM | POA: Diagnosis not present

## 2023-06-03 DIAGNOSIS — Z950 Presence of cardiac pacemaker: Secondary | ICD-10-CM

## 2023-06-03 LAB — COMPREHENSIVE METABOLIC PANEL
ALT: 16 U/L (ref 0–35)
AST: 21 U/L (ref 0–37)
Albumin: 4.9 g/dL (ref 3.5–5.2)
Alkaline Phosphatase: 74 U/L (ref 39–117)
BUN: 16 mg/dL (ref 6–23)
CO2: 28 mEq/L (ref 19–32)
Calcium: 10.7 mg/dL — ABNORMAL HIGH (ref 8.4–10.5)
Chloride: 103 mEq/L (ref 96–112)
Creatinine, Ser: 0.76 mg/dL (ref 0.40–1.20)
GFR: 77.45 mL/min (ref >60.00)
Glucose, Bld: 97 mg/dL (ref 70–99)
Potassium: 4.7 mEq/L (ref 3.5–5.1)
Sodium: 139 mEq/L (ref 135–145)
Total Bilirubin: 0.6 mg/dL (ref 0.2–1.2)
Total Protein: 7.8 g/dL (ref 6.0–8.3)

## 2023-06-03 LAB — URINALYSIS
Bilirubin Urine: NEGATIVE
Ketones, ur: NEGATIVE
Leukocytes,Ua: NEGATIVE
Nitrite: NEGATIVE
Specific Gravity, Urine: <=1.005 — AB (ref 1.000–1.030)
Total Protein, Urine: NEGATIVE
Urine Glucose: NEGATIVE
Urobilinogen, UA: 0.2 (ref 0.0–1.0)
pH: 6.5 (ref 5.0–8.0)

## 2023-06-03 LAB — CBC WITH DIFFERENTIAL/PLATELET
Basophils Absolute: 0.1 10*3/uL (ref 0.0–0.1)
Basophils Relative: 0.8 % (ref 0.0–3.0)
Eosinophils Absolute: 0.1 10*3/uL (ref 0.0–0.7)
Eosinophils Relative: 0.7 % (ref 0.0–5.0)
HCT: 46.4 % — ABNORMAL HIGH (ref 36.0–46.0)
Hemoglobin: 15.4 g/dL — ABNORMAL HIGH (ref 12.0–15.0)
Lymphocytes Relative: 18.8 % (ref 12.0–46.0)
Lymphs Abs: 1.5 10*3/uL (ref 0.7–4.0)
MCHC: 33.1 g/dL (ref 30.0–36.0)
MCV: 96.2 fl (ref 78.0–100.0)
Monocytes Absolute: 0.7 10*3/uL (ref 0.1–1.0)
Monocytes Relative: 9.1 % (ref 3.0–12.0)
Neutro Abs: 5.7 10*3/uL (ref 1.4–7.7)
Neutrophils Relative %: 70.6 % (ref 43.0–77.0)
Platelets: 394 10*3/uL (ref 150.0–400.0)
RBC: 4.83 Mil/uL (ref 3.87–5.11)
RDW: 13.3 % (ref 11.5–15.5)
WBC: 8.1 10*3/uL (ref 4.0–10.5)

## 2023-06-03 LAB — LIPID PANEL
Cholesterol: 186 mg/dL (ref 0–200)
HDL: 62 mg/dL (ref >39.00)
LDL Cholesterol: 104 mg/dL — ABNORMAL HIGH (ref 0–99)
NonHDL: 124.12
Total CHOL/HDL Ratio: 3
Triglycerides: 101 mg/dL (ref 0.0–149.0)
VLDL: 20.2 mg/dL (ref 0.0–40.0)

## 2023-06-03 LAB — TSH: TSH: 2.46 u[IU]/mL (ref 0.35–5.50)

## 2023-06-03 NOTE — Progress Notes (Signed)
Assessment & Plan:  1. Heat syncope, initial encounter Reassurance provided.  Education provided on preventing heat exhaustion.  Encouraged adequate hydration.  Patient requested urinalysis after we discussed that it was not necessary at this time. - Comprehensive metabolic panel - CBC with Differential/Platelet - TSH - Lipid panel - Urinalysis  2. Pacemaker Patient has already transmitted her pacemaker readings for the time of the incident and was told it was functioning properly.  3. Hyperlipidemia, unspecified hyperlipidemia type Patient requested lipid panel since she was already having her labs drawn. - Lipid panel   Follow up plan: Return if symptoms worsen or fail to improve.  Anna Boston, MSN, APRN, FNP-C  Subjective:  HPI: Anna Ellis is a 72 y.o. female presenting on 06/03/2023 for passed out (At a baseball game on Wed )  Patient is accompanied by her husband, who she is okay with being present.  Patient reports she passed out at a baseball game 5 days ago.  The game was in the morning; she had a biscuit for breakfast, but had only drink sips of Dr. Reino Kent.  She does not like drinking a lot of fluid when she is going to be out and unsure of the closest bathroom.  EMS was onsite and tended to her closer to noon when the incident occurred.  They did not obtain a glucose reading. Husband reports she was not with it, but she must not have lost consciousness as she remained sitting during the incident. She did not go to the hospital as recommended.  She has a pacemaker and just wants to make sure everything is okay was indeed due to dehydration and heat. She has already transmitted her pacemaker recording to cardiology and was reassured it was working properly.    ROS: Negative unless specifically indicated above in HPI.   Relevant past medical history reviewed and updated as indicated.   Allergies and medications reviewed and updated.   Current Outpatient  Medications:    acetaminophen (TYLENOL) 500 MG tablet, Take 1,000 mg by mouth every 6 (six) hours as needed (for pain.)., Disp: , Rfl:    ALPRAZolam (XANAX) 0.25 MG tablet, Take 1 tablet (0.25 mg total) by mouth 2 (two) times daily as needed., Disp: 60 tablet, Rfl: 1   aspirin EC 81 MG tablet, Take 81 mg by mouth daily., Disp: , Rfl:    atorvastatin (LIPITOR) 10 MG tablet, Take 1 tablet (10 mg total) by mouth daily. Annual appt due in January must see provider for future refills, Disp: 90 tablet, Rfl: 3   cholecalciferol (VITAMIN D3) 25 MCG (1000 UNIT) tablet, Take 1,000 Units by mouth daily., Disp: , Rfl:    Famotidine (PEPCID AC PO), Take 1 tablet by mouth daily in the afternoon., Disp: , Rfl:    losartan (COZAAR) 25 MG tablet, Take 1 tablet (25 mg total) by mouth daily., Disp: 90 tablet, Rfl: 3   metroNIDAZOLE (METROCREAM) 0.75 % cream, Apply 1 application topically daily as needed (for rosacea)., Disp: 45 g, Rfl: 3   montelukast (SINGULAIR) 10 MG tablet, Take 1 tablet (10 mg total) by mouth daily., Disp: 90 tablet, Rfl: 3   omeprazole (PRILOSEC) 20 MG capsule, Take 1 capsule (20 mg total) by mouth daily., Disp: 90 capsule, Rfl: 3   Simethicone (GAS-X PO), Take 1 tablet by mouth daily in the afternoon., Disp: , Rfl:    vitamin E 1000 UNIT capsule, Take 1,000 Units by mouth daily., Disp: , Rfl:    XIIDRA 5 %  SOLN, Two doses per tube - per eye DR, Disp: , Rfl:   Allergies  Allergen Reactions   Clarithromycin Hives   Etodolac     Gastritis    Food Color Red Hives   Sulfonamide Derivatives Hives   C[place]alt Rash    Found on Allergy Test   Parabens Rash    Objective:   BP (!) 148/86   Pulse 78   Temp 97.9 F (36.6 C)   Resp 20   Ht 5\' 4"  (1.626 m)   Wt 170 lb (77.1 kg)   BMI 29.18 kg/m    Physical Exam Vitals reviewed.  Constitutional:      General: She is not in acute distress.    Appearance: Normal appearance. She is not ill-appearing, toxic-appearing or diaphoretic.  HENT:      Head: Normocephalic and atraumatic.     Right Ear: Tympanic membrane, ear canal and external ear normal. There is no impacted cerumen.     Left Ear: Tympanic membrane, ear canal and external ear normal. There is no impacted cerumen.  Eyes:     General: No scleral icterus.       Right eye: No discharge.        Left eye: No discharge.     Conjunctiva/sclera: Conjunctivae normal.  Cardiovascular:     Rate and Rhythm: Normal rate and regular rhythm.     Heart sounds: Normal heart sounds. No murmur heard.    No friction rub. No gallop.  Pulmonary:     Effort: Pulmonary effort is normal. No respiratory distress.     Breath sounds: Normal breath sounds. No stridor. No wheezing, rhonchi or rales.  Musculoskeletal:        General: Normal range of motion.     Cervical back: Normal range of motion.  Skin:    General: Skin is warm and dry.     Capillary Refill: Capillary refill takes less than 2 seconds.  Neurological:     General: No focal deficit present.     Mental Status: She is alert and oriented to person, place, and time. Mental status is at baseline.  Psychiatric:        Mood and Affect: Mood normal.        Behavior: Behavior normal.        Thought Content: Thought content normal.        Judgment: Judgment normal.

## 2023-06-11 ENCOUNTER — Encounter: Payer: Self-pay | Admitting: Internal Medicine

## 2023-06-11 ENCOUNTER — Ambulatory Visit: Payer: Medicare Other | Admitting: Internal Medicine

## 2023-06-11 VITALS — BP 118/70 | HR 67 | Temp 97.9°F | Ht 64.0 in | Wt 169.0 lb

## 2023-06-11 DIAGNOSIS — T671XXA Heat syncope, initial encounter: Secondary | ICD-10-CM

## 2023-06-11 DIAGNOSIS — R55 Syncope and collapse: Secondary | ICD-10-CM

## 2023-06-11 DIAGNOSIS — Z780 Asymptomatic menopausal state: Secondary | ICD-10-CM

## 2023-06-11 DIAGNOSIS — F419 Anxiety disorder, unspecified: Secondary | ICD-10-CM | POA: Diagnosis not present

## 2023-06-11 DIAGNOSIS — I10 Essential (primary) hypertension: Secondary | ICD-10-CM

## 2023-06-11 LAB — COMPREHENSIVE METABOLIC PANEL
ALT: 15 U/L (ref 0–35)
AST: 21 U/L (ref 0–37)
Albumin: 4.6 g/dL (ref 3.5–5.2)
Alkaline Phosphatase: 65 U/L (ref 39–117)
BUN: 16 mg/dL (ref 6–23)
CO2: 29 mEq/L (ref 19–32)
Calcium: 10.1 mg/dL (ref 8.4–10.5)
Chloride: 104 mEq/L (ref 96–112)
Creatinine, Ser: 0.73 mg/dL (ref 0.40–1.20)
GFR: 81.27 mL/min (ref >60.00)
Glucose, Bld: 87 mg/dL (ref 70–99)
Potassium: 4.4 mEq/L (ref 3.5–5.1)
Sodium: 138 mEq/L (ref 135–145)
Total Bilirubin: 0.5 mg/dL (ref 0.2–1.2)
Total Protein: 7.2 g/dL (ref 6.0–8.3)

## 2023-06-11 LAB — CBC WITH DIFFERENTIAL/PLATELET
Basophils Absolute: 0 10*3/uL (ref 0.0–0.1)
Basophils Relative: 0.8 % (ref 0.0–3.0)
Eosinophils Absolute: 0.1 10*3/uL (ref 0.0–0.7)
Eosinophils Relative: 2.7 % (ref 0.0–5.0)
HCT: 41.7 % (ref 36.0–46.0)
Hemoglobin: 14 g/dL (ref 12.0–15.0)
Lymphocytes Relative: 32.1 % (ref 12.0–46.0)
Lymphs Abs: 1.8 10*3/uL (ref 0.7–4.0)
MCHC: 33.6 g/dL (ref 30.0–36.0)
MCV: 95 fl (ref 78.0–100.0)
Monocytes Absolute: 0.6 10*3/uL (ref 0.1–1.0)
Monocytes Relative: 11.6 % (ref 3.0–12.0)
Neutro Abs: 2.9 10*3/uL (ref 1.4–7.7)
Neutrophils Relative %: 52.8 % (ref 43.0–77.0)
Platelets: 337 10*3/uL (ref 150.0–400.0)
RBC: 4.39 Mil/uL (ref 3.87–5.11)
RDW: 13 % (ref 11.5–15.5)
WBC: 5.5 10*3/uL (ref 4.0–10.5)

## 2023-06-11 LAB — CORTISOL: Cortisol, Plasma: 6.4 ug/dL

## 2023-06-11 NOTE — Progress Notes (Signed)
Subjective:  Patient ID: Anna Ellis, female    DOB: [birth year]  Age: 72 y.o. MRN: [id number]  CC: Dizziness (Episode of syncope)   HPI Anna Ellis presents for a syncopal episode on 05/29/23 at the game. Pt was pale, blue lips. EMS came - low BP. Brief LOC. ?Overheated/dehydrated. Pacemaker transmission was ok. No arrhythmia. Pt was feeling bad x 10 d after the event She is here with her husband.  Outpatient Medications Prior to Visit  Medication Sig Dispense Refill   acetaminophen (TYLENOL) 500 MG tablet Take 1,000 mg by mouth every 6 (six) hours as needed (for pain.).     ALPRAZolam (XANAX) 0.25 MG tablet Take 1 tablet (0.25 mg total) by mouth 2 (two) times daily as needed. 60 tablet 1   aspirin EC 81 MG tablet Take 81 mg by mouth daily.     atorvastatin (LIPITOR) 10 MG tablet Take 1 tablet (10 mg total) by mouth daily. Annual appt due in January must see provider for future refills 90 tablet 3   cholecalciferol (VITAMIN D3) 25 MCG (1000 UNIT) tablet Take 1,000 Units by mouth daily.     Famotidine (PEPCID AC PO) Take 1 tablet by mouth daily in the afternoon.     losartan (COZAAR) 25 MG tablet Take 1 tablet (25 mg total) by mouth daily. 90 tablet 3   metroNIDAZOLE (METROCREAM) 0.75 % cream Apply 1 application topically daily as needed (for rosacea). 45 g 3   montelukast (SINGULAIR) 10 MG tablet Take 1 tablet (10 mg total) by mouth daily. 90 tablet 3   omeprazole (PRILOSEC) 20 MG capsule Take 1 capsule (20 mg total) by mouth daily. 90 capsule 3   Simethicone (GAS-X PO) Take 1 tablet by mouth daily in the afternoon.     vitamin E 1000 UNIT capsule Take 1,000 Units by mouth daily.     XIIDRA 5 % SOLN Two doses per tube - per eye DR     No facility-administered medications prior to visit.    ROS: Review of Systems  Constitutional:  Negative for activity change, appetite change, chills, fatigue and unexpected weight change.  HENT:  Negative for congestion, mouth sores and sinus  pressure.   Eyes:  Negative for visual disturbance.  Respiratory:  Negative for cough and chest tightness.   Gastrointestinal:  Negative for abdominal pain and nausea.  Genitourinary:  Negative for difficulty urinating, frequency and vaginal pain.  Musculoskeletal:  Negative for back pain and gait problem.  Skin:  Negative for pallor and rash.  Neurological:  Negative for dizziness, tremors, weakness, numbness and headaches.  Psychiatric/Behavioral:  Negative for confusion and sleep disturbance. The patient is not nervous/anxious.     Objective:  BP 118/70 (BP Location: Left Arm, Patient Position: Sitting, Cuff Size: Large)   Pulse 67   Temp 97.9 F (36.6 C) (Oral)   Ht 5\' 4"  (1.626 m)   Wt 169 lb (76.7 kg)   SpO2 98%   BMI 29.01 kg/m   BP Readings from Last 3 Encounters:  06/11/23 118/70  06/03/23 (!) 148/86  01/07/23 128/76    Wt Readings from Last 3 Encounters:  06/11/23 169 lb (76.7 kg)  06/03/23 170 lb (77.1 kg)  01/07/23 166 lb (75.3 kg)    Physical Exam Constitutional:      General: She is not in acute distress.    Appearance: Normal appearance. She is well-developed.  HENT:     Head: Normocephalic.     Right Ear: External  ear normal.     Left Ear: External ear normal.     Nose: Nose normal.  Eyes:     General:        Right eye: No discharge.        Left eye: No discharge.     Conjunctiva/sclera: Conjunctivae normal.     Pupils: Pupils are equal, round, and reactive to light.  Neck:     Thyroid: No thyromegaly.     Vascular: No JVD.     Trachea: No tracheal deviation.  Cardiovascular:     Rate and Rhythm: Normal rate and regular rhythm.     Heart sounds: Normal heart sounds.  Pulmonary:     Effort: No respiratory distress.     Breath sounds: No stridor. No wheezing.  Abdominal:     General: Bowel sounds are normal. There is no distension.     Palpations: Abdomen is soft. There is no mass.     Tenderness: There is no abdominal tenderness. There is  no guarding or rebound.  Musculoskeletal:        General: No tenderness.     Cervical back: Normal range of motion and neck supple. No rigidity.  Lymphadenopathy:     Cervical: No cervical adenopathy.  Skin:    Findings: No erythema or rash.  Neurological:     Cranial Nerves: No cranial nerve deficit.     Motor: No abnormal muscle tone.     Coordination: Coordination normal.     Deep Tendon Reflexes: Reflexes normal.  Psychiatric:        Behavior: Behavior normal.        Thought Content: Thought content normal.        Judgment: Judgment normal.     Lab Results  Component Value Date   WBC 8.1 06/03/2023   HGB 15.4 (H) 06/03/2023   HCT 46.4 (H) 06/03/2023   PLT 394.0 06/03/2023   GLUCOSE 97 06/03/2023   CHOL 186 06/03/2023   TRIG 101.0 06/03/2023   HDL 62.00 06/03/2023   LDLCALC 104 (H) 06/03/2023   ALT 16 06/03/2023   AST 21 06/03/2023   NA 139 06/03/2023   K 4.7 06/03/2023   CL 103 06/03/2023   CREATININE 0.76 06/03/2023   BUN 16 06/03/2023   CO2 28 06/03/2023   TSH 2.46 06/03/2023    CT Abdomen Pelvis W Contrast  Result Date: 08/25/2022 CLINICAL DATA:  RUQ pain, duodenal stricture, gastritis EXAM: CT ABDOMEN AND PELVIS WITH CONTRAST TECHNIQUE: Multidetector CT imaging of the abdomen and pelvis was performed using the standard protocol following bolus administration of intravenous contrast. RADIATION DOSE REDUCTION: This exam was performed according to the departmental dose-optimization program which includes automated exposure control, adjustment of the mA and/or kV according to patient size and/or use of iterative reconstruction technique. CONTRAST:  75mL OMNIPAQUE IOHEXOL 350 MG/ML SOLN COMPARISON:  Chest x-ray 07/11/2018 FINDINGS: Lower chest: Cardiac leads partially visualized. Possible tiny hiatal hernia. No acute abnormality. Hepatobiliary: Subcentimeter hypodensities too small to characterize. Otherwise no focal liver abnormality. Status post cholecystectomy. No  biliary dilatation. Pancreas: Intercalation of fat within the tail of the pancreas (6:59). No focal lesion. Normal pancreatic contour. No surrounding inflammatory changes. No main pancreatic ductal dilatation. Spleen: Normal in size without focal abnormality. Adrenals/Urinary Tract: No adrenal nodule bilaterally. Bilateral kidneys enhance symmetrically. Subcentimeter hypodensities too small to characterize. Left nephrolithiasis measuring up to 3 mm. No right nephrolithiasis. No ureterolithiasis bilaterally. No hydronephrosis. No hydroureter. The urinary bladder is unremarkable. On delayed  imaging, there is no urothelial wall thickening and there are no filling defects in the opacified portions of the bilateral collecting systems or ureters. Stomach/Bowel: Stomach is within normal limits. No evidence of bowel wall thickening or dilatation. Sigmoid diverticulosis. Appendix appears normal. Vascular/Lymphatic: No abdominal aorta or iliac aneurysm. Mild atherosclerotic plaque of the aorta and its branches. No abdominal, pelvic, or inguinal lymphadenopathy. Reproductive: Status post hysterectomy. No adnexal masses. Other: No intraperitoneal free fluid. No intraperitoneal free gas. No organized fluid collection. Musculoskeletal: No abdominal wall hernia or abnormality. No suspicious lytic or blastic osseous lesions. No acute displaced fracture. Multilevel degenerative changes of the spine. IMPRESSION: 1. Possible tiny hiatal hernia. 2. Nonobstructive left nephrolithiasis measuring up to 3 mm. 3. Sigmoid diverticulosis with no acute diverticulitis. 4.  Aortic Atherosclerosis (ICD10-I70.0). Electronically Signed   By: Tish Frederickson M.D.   On: 08/25/2022 20:48    Assessment & Plan:   Problem List Items Addressed This Visit     HTN (hypertension) - Primary   Other Visit Diagnoses     Heat syncope, initial encounter       Relevant Orders   PTH, intact and calcium   CBC with Differential/Platelet   Comprehensive  metabolic panel   Cortisol   Hypercalcemia       Relevant Orders   PTH, intact and calcium   CBC with Differential/Platelet   Comprehensive metabolic panel   Postmenopausal estrogen deficiency       Relevant Orders   DG Bone Density         No orders of the defined types were placed in this encounter.     Follow-up: No follow-ups on file.  Sonda Primes, MD

## 2023-06-14 LAB — PTH, INTACT AND CALCIUM: PTH: 42 pg/mL (ref 16–77)

## 2023-06-14 LAB — TIQ-NTM

## 2023-06-25 ENCOUNTER — Encounter: Payer: Self-pay | Admitting: Internal Medicine

## 2023-06-30 DIAGNOSIS — R55 Syncope and collapse: Secondary | ICD-10-CM | POA: Insufficient documentation

## 2023-06-30 NOTE — Assessment & Plan Note (Signed)
Continue with atorvastatin 

## 2023-06-30 NOTE — Assessment & Plan Note (Signed)
A syncopal episode on 05/29/23 at the game. Pt was pale, blue lips. EMS came - low BP. Brief LOC. ?Overheated/dehydrated. Pacemaker transmission was ok. No arrhythmia. Pt was feeling bad x 10 d after the event Likely heat exhaustion. Obtain lab work with CBC, c-Met, cortisol and others

## 2023-06-30 NOTE — Assessment & Plan Note (Signed)
Continue with losartan.  Hydrate well

## 2023-06-30 NOTE — Assessment & Plan Note (Signed)
Xanax prn  Potential benefits of a long term benzodiazepines  use as well as potential risks  and complications were explained to the patient and were aknowledged. 

## 2023-07-18 ENCOUNTER — Ambulatory Visit (INDEPENDENT_AMBULATORY_CARE_PROVIDER_SITE_OTHER): Payer: Medicare Other

## 2023-07-18 DIAGNOSIS — I441 Atrioventricular block, second degree: Secondary | ICD-10-CM | POA: Diagnosis not present

## 2023-08-19 ENCOUNTER — Ambulatory Visit (INDEPENDENT_AMBULATORY_CARE_PROVIDER_SITE_OTHER)
Admission: RE | Admit: 2023-08-19 | Discharge: 2023-08-19 | Disposition: A | Discharge status: Home / Self Care | Payer: Medicare Other | Source: Ambulatory Visit | Attending: Internal Medicine | Admitting: Internal Medicine

## 2023-08-19 DIAGNOSIS — Z780 Asymptomatic menopausal state: Secondary | ICD-10-CM | POA: Diagnosis not present

## 2023-09-03 ENCOUNTER — Other Ambulatory Visit: Payer: Self-pay | Admitting: Internal Medicine

## 2023-09-03 NOTE — Telephone Encounter (Signed)
PT is requesting to have a refill for omeprazole sent to [facility]. She has scheduled an OV for 12/13/2023. Please advise.

## 2023-09-03 NOTE — Progress Notes (Signed)
Remote pacemaker transmission.   

## 2023-09-05 ENCOUNTER — Ambulatory Visit: Payer: Medicare Other | Admitting: Internal Medicine

## 2023-09-05 ENCOUNTER — Encounter: Payer: Self-pay | Admitting: Internal Medicine

## 2023-09-05 VITALS — BP 132/84 | HR 60 | Temp 97.9°F | Ht 64.0 in | Wt 168.0 lb

## 2023-09-05 DIAGNOSIS — I10 Essential (primary) hypertension: Secondary | ICD-10-CM | POA: Diagnosis not present

## 2023-09-05 DIAGNOSIS — H9201 Otalgia, right ear: Secondary | ICD-10-CM

## 2023-09-05 DIAGNOSIS — Z23 Encounter for immunization: Secondary | ICD-10-CM

## 2023-09-05 MED ORDER — NEOMYCIN-POLYMYXIN-HC 3.5-10000-1 OT SUSP: 3.0000 [drp] | Freq: Three times a day (TID) | OTIC | 0 refills | Status: DC

## 2023-09-05 NOTE — Patient Instructions (Signed)
We have sent in ear drops to use 3 drops 3 times a day for 3 days. You can use this off an on as needed.

## 2023-09-05 NOTE — Assessment & Plan Note (Signed)
Suspect resolving URI. Rx cortisporin ear drops to use for pain and inflammation.

## 2023-09-05 NOTE — Progress Notes (Signed)
Subjective:   Patient ID: Anna Ellis, female    DOB: 06-23-1951, 72 y.o.   MRN: [id number]  HPI The patient is a 72 YO female coming in for ear ache and cold symptoms. Took cold medication otc and then had some high BP readings. She has doubled up on losartan to 50 mg daily and was still high at home.   Review of Systems  Constitutional: Negative.  Negative for fatigue, fever and unexpected weight change.  HENT:  Positive for congestion and ear pain. Negative for ear discharge, sinus pain, sneezing, sore throat, tinnitus, trouble swallowing and voice change.   Eyes: Negative.   Respiratory:  Negative for cough, chest tightness, shortness of breath and wheezing.   Cardiovascular: Negative.  Negative for chest pain, palpitations and leg swelling.  Gastrointestinal: Negative.  Negative for abdominal distention, abdominal pain, constipation, diarrhea, nausea and vomiting.  Musculoskeletal: Negative.   Skin: Negative.   Neurological: Negative.   Psychiatric/Behavioral: Negative.      Objective:  Physical Exam Constitutional:      Appearance: She is well-developed.  HENT:     Head: Normocephalic and atraumatic.     Comments: Oropharynx with redness and clear drainage, nose with swollen turbinates, TMs normal bilaterally.  Neck:     Thyroid: No thyromegaly.  Cardiovascular:     Rate and Rhythm: Normal rate and regular rhythm.  Pulmonary:     Effort: Pulmonary effort is normal. No respiratory distress.     Breath sounds: Normal breath sounds. No wheezing or rales.  Abdominal:     General: Bowel sounds are normal. There is no distension.     Palpations: Abdomen is soft.     Tenderness: There is no abdominal tenderness. There is no rebound.  Musculoskeletal:        General: No tenderness.     Cervical back: Normal range of motion.  Lymphadenopathy:     Cervical: No cervical adenopathy.  Skin:    General: Skin is warm and dry.  Neurological:     Mental Status: She is alert  and oriented to person, place, and time.     Coordination: Coordination normal.     Vitals:   09/05/23 1020  BP: 132/84  Pulse: 60  Temp: 97.9 F (36.6 C)  TempSrc: Oral  SpO2: 98%  Weight: 168 lb (76.2 kg)  Height: 5\' 4"  (1.626 m)    Assessment & Plan:  Flu shot given at visit

## 2023-09-05 NOTE — Assessment & Plan Note (Signed)
Likely resolving viral URI. Taking otc cold products likely raised BP and advised to avoid going forward. Long term I have advised her to return to 25 mg losartan. BP at goal today.

## 2023-10-17 ENCOUNTER — Ambulatory Visit (INDEPENDENT_AMBULATORY_CARE_PROVIDER_SITE_OTHER): Payer: Medicare Other

## 2023-10-17 DIAGNOSIS — I441 Atrioventricular block, second degree: Secondary | ICD-10-CM

## 2023-10-17 LAB — CUP PACEART REMOTE DEVICE CHECK
Battery Remaining Longevity: 58 mo
Battery Voltage: 2.96 V
Brady Statistic AP VP Percent: 38.7 %
Brady Statistic AP VS Percent: 0 %
Brady Statistic AS VP Percent: 61.1 %
Brady Statistic AS VS Percent: 0.19 %
Brady Statistic RA Percent Paced: 38.72 %
Brady Statistic RV Percent Paced: 99.8 %
Date Time Interrogation Session: 20241106202509
Implantable Lead Connection Status: 753985
Implantable Lead Connection Status: 753985
Implantable Lead Implant Date: 20190801
Implantable Lead Implant Date: 20190801
Implantable Lead Location: 753859
Implantable Lead Location: 753860
Implantable Lead Model: 3830
Implantable Lead Model: 5076
Implantable Pulse Generator Implant Date: 20190801
Lead Channel Impedance Value: 323 Ohm
Lead Channel Impedance Value: 342 Ohm
Lead Channel Impedance Value: 380 Ohm
Lead Channel Impedance Value: 437 Ohm
Lead Channel Pacing Threshold Amplitude: 0.75 V
Lead Channel Pacing Threshold Amplitude: 0.875 V
Lead Channel Pacing Threshold Pulse Width: 0.4 ms
Lead Channel Pacing Threshold Pulse Width: 0.4 ms
Lead Channel Sensing Intrinsic Amplitude: 3.375 mV
Lead Channel Sensing Intrinsic Amplitude: 3.375 mV
Lead Channel Sensing Intrinsic Amplitude: 4.625 mV
Lead Channel Sensing Intrinsic Amplitude: 5.125 mV
Lead Channel Setting Pacing Amplitude: 2 V
Lead Channel Setting Pacing Amplitude: 2.5 V
Lead Channel Setting Pacing Pulse Width: 0.4 ms
Lead Channel Setting Sensing Sensitivity: 1.2 mV
Zone Setting Status: 755011
Zone Setting Status: 755011

## 2023-11-04 NOTE — Progress Notes (Signed)
Remote pacemaker transmission.   

## 2023-11-06 ENCOUNTER — Other Ambulatory Visit: Payer: Self-pay | Admitting: Internal Medicine

## 2023-12-13 ENCOUNTER — Ambulatory Visit: Payer: Medicare Other | Admitting: Internal Medicine

## 2023-12-13 ENCOUNTER — Encounter: Payer: Self-pay | Admitting: Internal Medicine

## 2023-12-13 VITALS — BP 130/78 | HR 84 | Ht 64.0 in | Wt 170.1 lb

## 2023-12-13 DIAGNOSIS — K219 Gastro-esophageal reflux disease without esophagitis: Secondary | ICD-10-CM | POA: Diagnosis not present

## 2023-12-13 DIAGNOSIS — R1013 Epigastric pain: Secondary | ICD-10-CM

## 2023-12-13 DIAGNOSIS — K315 Obstruction of duodenum: Secondary | ICD-10-CM | POA: Diagnosis not present

## 2023-12-13 DIAGNOSIS — K2970 Gastritis, unspecified, without bleeding: Secondary | ICD-10-CM

## 2023-12-13 MED ORDER — OMEPRAZOLE 20 MG PO CPDR: 20.0000 mg | DELAYED_RELEASE_CAPSULE | Freq: Every day | ORAL | 3 refills | Status: DC

## 2023-12-13 NOTE — Progress Notes (Signed)
 Chief Complaint: Abdominal pain  HPI : 73 year old female with history of gallstones, duodenal stricture, and osteoarthritis presents for follow up of abdominal pain  Interval History: She made this appointment originally to request medication refills.  She is doing really well from a GI standpoint.  Nausea and ab pain are better. She wants to know if she can restart using NSAIDs because she has issues with knee pain.  She has been getting injections in her knees, which do help some.  She wants to Advil  and Aleve PRN, has already been doing a little bit of NSAID use. Tylenol  doesn't work at all. She has no longer needed to use Gas-X.   Wt Readings from Last 3 Encounters:  12/13/23 170 lb 2 oz (77.2 kg)  09/05/23 168 lb (76.2 kg)  06/11/23 169 lb (76.7 kg)   Current Outpatient Medications  Medication Sig Dispense Refill   acetaminophen  (TYLENOL ) 500 MG tablet Take 1,000 mg by mouth every 6 (six) hours as needed (for pain.).     ALPRAZolam  (XANAX ) 0.25 MG tablet Take 1 tablet (0.25 mg total) by mouth 2 (two) times daily as needed. 60 tablet 1   aspirin  EC 81 MG tablet Take 81 mg by mouth daily.     atorvastatin  (LIPITOR) 10 MG tablet TAKE 1 TABLET BY MOUTH DAILY 90 tablet 3   cholecalciferol (VITAMIN D3) 25 MCG (1000 UNIT) tablet Take 1,000 Units by mouth daily.     Famotidine (PEPCID AC PO) Take 1 tablet by mouth daily in the afternoon.     losartan  (COZAAR ) 25 MG tablet Take 1 tablet (25 mg total) by mouth daily. 90 tablet 3   metroNIDAZOLE  (METROCREAM ) 0.75 % cream Apply 1 application topically daily as needed (for rosacea). 45 g 3   montelukast  (SINGULAIR ) 10 MG tablet Take 1 tablet (10 mg total) by mouth daily. 90 tablet 3   neomycin -polymyxin-hydrocortisone (CORTISPORIN) 3.5-10000-1 OTIC suspension Place 3 drops into both ears 3 (three) times daily. 10 mL 0   omeprazole  (PRILOSEC) 20 MG capsule TAKE 1 CAPSULE BY MOUTH EVERY DAY 90 capsule 3   Simethicone (GAS-X PO) Take 1 tablet by  mouth daily in the afternoon.     vitamin E 1000 UNIT capsule Take 1,000 Units by mouth daily.     XIIDRA 5 % SOLN Two doses per tube - per eye DR     No current facility-administered medications for this visit.    Physical Exam: BP 130/78 (BP Location: Left Arm, Patient Position: Sitting, Cuff Size: Normal)   Pulse 84   Ht 5' 4 (1.626 m)   Wt 170 lb 2 oz (77.2 kg)   BMI 29.20 kg/m  Constitutional: Pleasant,well-developed, female in no acute distress. HEENT: Normocephalic and atraumatic. Conjunctivae are normal. No scleral icterus. Cardiovascular: Normal rate, regular rhythm.  Pulmonary/chest: Effort normal and breath sounds normal. No wheezing, rales or rhonchi. Abdominal: Soft, nondistended, non-tender. Bowel sounds active throughout. There are no masses palpable. No hepatomegaly. Extremities: No edema Neurological: Alert and oriented to person place and time. Skin: Skin is warm and dry. No rashes noted. Psychiatric: Normal mood and affect. Behavior is normal.  Labs 04/2018: H pylori antibody negative.  Labs 12/2021: TSH nml  Labs 04/2022: CBC nml. CMP nml. Lipase nml.   Labs 05/2023: TSH normal.  Labs 06/2023: CBC normal.  CMP normal.  Cortisol normal.  RUQ U/S 05/04/22: IMPRESSION: Limited study due to bowel gas. No biliary ductal dilatation or liver mass identified.  Colonoscopy 09/12/17:  Diverticulosis of the sigmoid colon. Internal hemorrhoids, scarring from prior banding. Rec repeat colonoscopy in 10 years  CT A/P w/contrast 08/24/22: IMPRESSION: 1. Possible tiny hiatal hernia. 2. Nonobstructive left nephrolithiasis measuring up to 3 mm. 3. Sigmoid diverticulosis with no acute diverticulitis. 4.  Aortic Atherosclerosis (ICD10-I70.0).  EGD 06/28/22: - Non-obstructing Schatzki ring. - 2 cm hiatal hernia. - Gastritis. Biopsied. - Acquired duodenal stenosis. Biopsied. Path: 1. Surgical [P], duodenal stricture SUPERFICIAL FRAGMENTS OF REACTIVE DUODENAL MUCOSA WITH  EXTENSIVE GASTRIC METAPLASIA AND FOCAL CHANGES CONSISTENT WITH PRIOR EROSION AND COMPATIBLE WITH PEPTIC DUODENITIS (SEE MICROSCOPIC COMMENT) 2. Surgical [P], gastric CHRONIC, FOCALLY ACTIVE GASTRITIS WITH LYMPHOID AGGREGATES HELICOBACTER STAIN NEGATIVE (IHC, ADEQUATE CONTROL) NEGATIVE FOR INTESTINAL METAPLASIA, DYSPLASIA AND CARCINOMA Microscopic Comment 1. Sections of the first specimen labeled duodenal stricture show multiple superficial fragments of small bowel with Brunner's glands exhibiting blunted villi with mucin depletion and extensive surface gastric foveolar epithelium. Very focally there are stromal changes compatible with prior erosion. The scant lamina propria exhibits the normal complement of mononuclear cells. There is no increase in intraepithelial lymphocytes and the collagen table is of normal thickness. No granulomas or parasites are seen. There is no evidence of dysplasia or carcinoma  ASSESSMENT AND PLAN: Duodenal stricture Epigastric and RUQ ab pain -resolved Nausea -resolved GERD Patient continues to be doing really well from a GI standpoint.  Her abdominal pain was previously attributed to NSAID induced inflammation of stomach and small bowel that is led to development of a duodenal stricture.  Patient has been doing well on once daily omeprazole  therapy.  Patient also inquired about NSAID use.  I told her to try to use NSAIDs sparingly since NSAIDs could potentially make her duodenal stricture worse and lead to recurrence in her GI symptoms. - Continue omeprazole  20 mg every day and Pepcid at bedtime - Okay to use NSAIDs sparingly. Can consider Celebrex  - Due for repeat colonoscopy in 09/2027 for colon cancer screening - Can consider repeat EGD in the future for dilation of duodenal stricture if patient were to have recurrent symptoms - RTC 1 year  Estefana Kidney, MD  I spent 32 minutes of time, including in depth chart review, independent review of results as  outlined above, communicating results with the patient directly, face-to-face time with the patient, coordinating care, and ordering studies and medications as appropriate, and documentation.

## 2023-12-13 NOTE — Patient Instructions (Addendum)
 Okay to use NSAIDs sparingly.   Follow up in 1 year  We have sent the following medications to your pharmacy for you to pick up at your convenience: Omeprazole   _______________________________________________________  If your blood pressure at your visit was 140/90 or greater, please contact your primary care physician to follow up on this.  _______________________________________________________  If you are age 75 or older, your body mass index should be between 23-30. Your Body mass index is 29.2 kg/m. If this is out of the aforementioned range listed, please consider follow up with your Primary Care Provider.  If you are age 67 or younger, your body mass index should be between 19-25. Your Body mass index is 29.2 kg/m. If this is out of the aformentioned range listed, please consider follow up with your Primary Care Provider.   ________________________________________________________  The [place] [facility] providers would like to encourage you to use MYCHART to communicate with providers for non-urgent requests or questions.  Due to long hold times on the telephone, sending your provider a message by [facility] may be a faster and more efficient way to get a response.  Please allow 48 business hours for a response.  Please remember that this is for non-urgent requests.  _______________________________________________________   Thank you for entrusting me with your care and for choosing [facility], Dr. Estefana Kidney

## 2023-12-26 NOTE — Progress Notes (Signed)
  Electrophysiology Office Note:   Date:  12/27/2023  ID:  KIRIAKI GRESHAM, DOB Nov 03, 1951, MRN [id number]  Primary Cardiologist: None Primary Heart Failure: None Electrophysiologist: Maurice Small, MD       History of Present Illness:   Anna Ellis is a 73 y.o. 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   Since last being seen in our clinic the patient reports she has been doing well. No concerns regarding  her device.  She notes she had an episode in June of 2024 where she passed out and was curious if she had anything on the device (nothing on device, heat related syncope at a ball game).  She remains very active. She denies chest pain, palpitations, dyspnea, PND, orthopnea, nausea, vomiting, dizziness, syncope, edema, weight gain, or early satiety.   Review of systems complete and found to be negative unless listed in HPI.   EP Information / Studies Reviewed:    EKG is ordered today. Personal review as below.  EKG Interpretation Date/Time:  Friday December 27 2023 10:32:04 EST Ventricular Rate:  68 PR Interval:    QRS Duration:  146 QT Interval:  420 QTC Calculation: 446 R Axis:   76  Text Interpretation: Ventricular-paced rhythm Confirmed by Canary Brim ([id number]) on 12/27/2023 11:29:18 AM   PPM Interrogation-  reviewed in detail today,  See PACEART report.  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 Bundle of His Note she had hives at device insertion > thought related to chlorhexidine vs abx in pocket, required topical steroids  Studies:  LHC 2019 > minimal CAD, well preserved LVEF with normal EDP, persistent 2:1 AVB ECHO 07/2018 > LVEF 55-60%   Arrhythmia / AAD Mobitz II s/p PPM        Physical Exam:   VS:  BP 134/74 (Cuff Size: Normal)   Pulse 77   Ht 5\' 4"  (1.626 m)   Wt 168 lb (76.2 kg)    SpO2 97%   BMI 28.84 kg/m    Wt Readings from Last 3 Encounters:  12/27/23 168 lb (76.2 kg)  12/13/23 170 lb 2 oz (77.2 kg)  09/05/23 168 lb (76.2 kg)     GEN: Well nourished, well developed in no acute distress NECK: No JVD; No carotid bruits CARDIAC: Regular rate and rhythm, no murmurs, rubs, gallops RESPIRATORY:  Clear to auscultation without rales, wheezing or rhonchi  ABDOMEN: Soft, non-tender, non-distended EXTREMITIES:  No edema; No deformity   ASSESSMENT AND PLAN:    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 Pending f/u with PCP -reviewed need for follow up renal function with PCP in next few weeks at visit   Disposition:   Follow up with Dr. Nelly Laurence or B.Veleta Miners, NP in 12 months  Signed, Canary Brim, NP-C, AGACNP-BC [facility] - Electrophysiology  12/27/2023, 11:39 AM

## 2023-12-27 ENCOUNTER — Ambulatory Visit: Payer: Medicare Other | Attending: Physician Assistant | Admitting: Pulmonary Disease

## 2023-12-27 ENCOUNTER — Encounter: Payer: Self-pay | Admitting: Pulmonary Disease

## 2023-12-27 VITALS — BP 134/74 | HR 77 | Ht 64.0 in | Wt 168.0 lb

## 2023-12-27 DIAGNOSIS — I10 Essential (primary) hypertension: Secondary | ICD-10-CM | POA: Diagnosis not present

## 2023-12-27 DIAGNOSIS — I441 Atrioventricular block, second degree: Secondary | ICD-10-CM

## 2023-12-27 DIAGNOSIS — Z950 Presence of cardiac pacemaker: Secondary | ICD-10-CM

## 2023-12-27 LAB — CUP PACEART INCLINIC DEVICE CHECK
Battery Remaining Longevity: 53 mo
Battery Voltage: 2.95 V
Brady Statistic AP VP Percent: 41.94 %
Brady Statistic AP VS Percent: 0 %
Brady Statistic AS VP Percent: 57.86 %
Brady Statistic AS VS Percent: 0.2 %
Brady Statistic RA Percent Paced: 41.96 %
Brady Statistic RV Percent Paced: 99.8 %
Date Time Interrogation Session: 20250117113454
Implantable Lead Connection Status: 753985
Implantable Lead Connection Status: 753985
Implantable Lead Implant Date: 20190801
Implantable Lead Implant Date: 20190801
Implantable Lead Location: 753859
Implantable Lead Location: 753860
Implantable Lead Model: 3830
Implantable Lead Model: 5076
Implantable Pulse Generator Implant Date: 20190801
Lead Channel Impedance Value: 380 Ohm
Lead Channel Impedance Value: 399 Ohm
Lead Channel Impedance Value: 418 Ohm
Lead Channel Impedance Value: 475 Ohm
Lead Channel Pacing Threshold Amplitude: 0.75 V
Lead Channel Pacing Threshold Amplitude: 0.75 V
Lead Channel Pacing Threshold Pulse Width: 0.4 ms
Lead Channel Pacing Threshold Pulse Width: 0.4 ms
Lead Channel Sensing Intrinsic Amplitude: 4.625 mV
Lead Channel Sensing Intrinsic Amplitude: 4.75 mV
Lead Channel Sensing Intrinsic Amplitude: 5.375 mV
Lead Channel Sensing Intrinsic Amplitude: 6.25 mV
Lead Channel Setting Pacing Amplitude: 2 V
Lead Channel Setting Pacing Amplitude: 2.5 V
Lead Channel Setting Pacing Pulse Width: 0.4 ms
Lead Channel Setting Sensing Sensitivity: 1.2 mV
Zone Setting Status: 755011
Zone Setting Status: 755011

## 2023-12-27 NOTE — Patient Instructions (Signed)
Medication Instructions:  Your physician recommends that you continue on your current medications as directed. Please refer to the Current Medication list given to you today.  *If you need a refill on your cardiac medications before your next appointment, please call your pharmacy*  Lab Work: None ordered If you have labs (blood work) drawn today and your tests are completely normal, you will receive your results only by: MyChart Message (if you have MyChart) OR A paper copy in the mail If you have any lab test that is abnormal or we need to change your treatment, we will call you to review the results.  Follow-Up: At [facility], you and your health needs are our priority.  As part of our continuing mission to provide you with exceptional heart care, we have created designated Provider Care Teams.  These Care Teams include your primary Cardiologist (physician) and Advanced Practice Providers (APPs -  Physician Assistants and Nurse Practitioners) who all work together to provide you with the care you need, when you need it.  Your next appointment:   1 year(s)  Provider:   York Pellant, MD or Canary Brim, NP

## 2024-01-04 ENCOUNTER — Encounter: Payer: Self-pay | Admitting: Cardiovascular Disease

## 2024-01-09 ENCOUNTER — Ambulatory Visit: Payer: Medicare Other | Admitting: Internal Medicine

## 2024-01-09 ENCOUNTER — Encounter: Payer: Self-pay | Admitting: Internal Medicine

## 2024-01-09 VITALS — BP 164/82 | HR 70 | Temp 97.7°F | Ht 64.0 in | Wt 172.4 lb

## 2024-01-09 DIAGNOSIS — I10 Essential (primary) hypertension: Secondary | ICD-10-CM

## 2024-01-09 DIAGNOSIS — H5711 Ocular pain, right eye: Secondary | ICD-10-CM | POA: Diagnosis not present

## 2024-01-09 DIAGNOSIS — Z0000 Encounter for general adult medical examination without abnormal findings: Secondary | ICD-10-CM

## 2024-01-09 DIAGNOSIS — E785 Hyperlipidemia, unspecified: Secondary | ICD-10-CM | POA: Diagnosis not present

## 2024-01-09 LAB — LIPID PANEL
Cholesterol: 162 mg/dL (ref 0–200)
HDL: 69.5 mg/dL (ref >39.00)
LDL Cholesterol: 82 mg/dL (ref 0–99)
NonHDL: 92.87
Total CHOL/HDL Ratio: 2
Triglycerides: 53 mg/dL (ref 0.0–149.0)
VLDL: 10.6 mg/dL (ref 0.0–40.0)

## 2024-01-09 LAB — COMPREHENSIVE METABOLIC PANEL
ALT: 16 U/L (ref 0–35)
AST: 20 U/L (ref 0–37)
Albumin: 4.4 g/dL (ref 3.5–5.2)
Alkaline Phosphatase: 61 U/L (ref 39–117)
BUN: 20 mg/dL (ref 6–23)
CO2: 29 meq/L (ref 19–32)
Calcium: 9.9 mg/dL (ref 8.4–10.5)
Chloride: 102 meq/L (ref 96–112)
Creatinine, Ser: 0.84 mg/dL (ref 0.40–1.20)
GFR: 68.39 mL/min (ref >60.00)
Glucose, Bld: 86 mg/dL (ref 70–99)
Potassium: 4.3 meq/L (ref 3.5–5.1)
Sodium: 139 meq/L (ref 135–145)
Total Bilirubin: 0.7 mg/dL (ref 0.2–1.2)
Total Protein: 6.9 g/dL (ref 6.0–8.3)

## 2024-01-09 LAB — URINALYSIS
Bilirubin Urine: NEGATIVE
Hgb urine dipstick: NEGATIVE
Ketones, ur: NEGATIVE
Leukocytes,Ua: NEGATIVE
Nitrite: NEGATIVE
Specific Gravity, Urine: <=1.005 — AB (ref 1.000–1.030)
Total Protein, Urine: NEGATIVE
Urine Glucose: NEGATIVE
Urobilinogen, UA: 0.2 (ref 0.0–1.0)
pH: 6.5 (ref 5.0–8.0)

## 2024-01-09 LAB — CBC WITH DIFFERENTIAL/PLATELET
Basophils Absolute: 0 10*3/uL (ref 0.0–0.1)
Basophils Relative: 0.7 % (ref 0.0–3.0)
Eosinophils Absolute: 0.3 10*3/uL (ref 0.0–0.7)
Eosinophils Relative: 5.1 % — ABNORMAL HIGH (ref 0.0–5.0)
HCT: 44 % (ref 36.0–46.0)
Hemoglobin: 14.6 g/dL (ref 12.0–15.0)
Lymphocytes Relative: 25.9 % (ref 12.0–46.0)
Lymphs Abs: 1.3 10*3/uL (ref 0.7–4.0)
MCHC: 33.3 g/dL (ref 30.0–36.0)
MCV: 97.1 fL (ref 78.0–100.0)
Monocytes Absolute: 0.6 10*3/uL (ref 0.1–1.0)
Monocytes Relative: 10.8 % (ref 3.0–12.0)
Neutro Abs: 3 10*3/uL (ref 1.4–7.7)
Neutrophils Relative %: 57.5 % (ref 43.0–77.0)
Platelets: 335 10*3/uL (ref 150.0–400.0)
RBC: 4.53 Mil/uL (ref 3.87–5.11)
RDW: 13.4 % (ref 11.5–15.5)
WBC: 5.2 10*3/uL (ref 4.0–10.5)

## 2024-01-09 LAB — TSH: TSH: 2.67 u[IU]/mL (ref 0.35–5.50)

## 2024-01-09 MED ORDER — METHYLPREDNISOLONE 4 MG PO TBPK: ORAL_TABLET | ORAL | 0 refills | Status: DC

## 2024-01-09 MED ORDER — IPRATROPIUM BROMIDE 0.06 % NA SOLN: 2.0000 | Freq: Three times a day (TID) | NASAL | 2 refills | Status: AC

## 2024-01-09 MED ORDER — ALPRAZOLAM 0.25 MG PO TABS: 0.2500 mg | ORAL_TABLET | Freq: Two times a day (BID) | ORAL | 2 refills | Status: DC | PRN

## 2024-01-09 MED ORDER — CELECOXIB 200 MG PO CAPS: 200.0000 mg | ORAL_CAPSULE | Freq: Every day | ORAL | 1 refills | Status: DC | PRN

## 2024-01-09 MED ORDER — ALPRAZOLAM 0.25 MG PO TABS: 0.2500 mg | ORAL_TABLET | Freq: Two times a day (BID) | ORAL | 2 refills | Status: AC | PRN

## 2024-01-09 MED ORDER — CEFUROXIME AXETIL 250 MG PO TABS: 250.0000 mg | ORAL_TABLET | Freq: Two times a day (BID) | ORAL | 1 refills | Status: DC

## 2024-01-09 MED ORDER — LOSARTAN POTASSIUM 25 MG PO TABS: 25.0000 mg | ORAL_TABLET | Freq: Two times a day (BID) | ORAL | 3 refills | Status: DC

## 2024-01-09 NOTE — Progress Notes (Signed)
Subjective:  Patient ID: Anna Ellis, female    DOB: March 15, 1951  Age: 73 y.o. MRN: [id number]  CC: Annual Exam   HPI Anna Ellis presents for a well exam C/o knee pain B Dr Leonides Schanz recommended Celebrex BP was elevated  Outpatient Medications Prior to Visit  Medication Sig Dispense Refill   acetaminophen (TYLENOL) 500 MG tablet Take 1,000 mg by mouth every 6 (six) hours as needed (for pain.).     aspirin EC 81 MG tablet Take 81 mg by mouth daily.     atorvastatin (LIPITOR) 10 MG tablet TAKE 1 TABLET BY MOUTH DAILY 90 tablet 3   cholecalciferol (VITAMIN D3) 25 MCG (1000 UNIT) tablet Take 1,000 Units by mouth daily.     Famotidine (PEPCID AC PO) Take 1 tablet by mouth daily in the afternoon.     metroNIDAZOLE (METROCREAM) 0.75 % cream Apply 1 application topically daily as needed (for rosacea). 45 g 3   omeprazole (PRILOSEC) 20 MG capsule Take 1 capsule (20 mg total) by mouth daily. 90 capsule 3   Simethicone (GAS-X PO) Take 1 tablet by mouth daily in the afternoon.     vitamin E 1000 UNIT capsule Take 1,000 Units by mouth daily.     XIIDRA 5 % SOLN Two doses per tube - per eye DR     ALPRAZolam (XANAX) 0.25 MG tablet Take 1 tablet (0.25 mg total) by mouth 2 (two) times daily as needed. 60 tablet 1   losartan (COZAAR) 25 MG tablet Take 1 tablet (25 mg total) by mouth daily. 90 tablet 3   montelukast (SINGULAIR) 10 MG tablet Take 1 tablet (10 mg total) by mouth daily. 90 tablet 3   neomycin-polymyxin-hydrocortisone (CORTISPORIN) 3.5-10000-1 OTIC suspension Place 3 drops into both ears 3 (three) times daily. 10 mL 0   No facility-administered medications prior to visit.    ROS: Review of Systems  Constitutional:  Negative for activity change, appetite change, chills, fatigue and unexpected weight change.  HENT:  Negative for congestion, mouth sores and sinus pressure.   Eyes:  Negative for visual disturbance.  Respiratory:  Negative for cough and chest tightness.    Gastrointestinal:  Negative for abdominal pain and nausea.  Genitourinary:  Negative for difficulty urinating, frequency and vaginal pain.  Musculoskeletal:  Negative for back pain and gait problem.  Skin:  Negative for pallor and rash.  Neurological:  Negative for dizziness, tremors, weakness, numbness and headaches.  Psychiatric/Behavioral:  Negative for confusion and sleep disturbance.     Objective:  BP (!) 164/82   Pulse 70   Temp 97.7 F (36.5 C)   Ht 5\' 4"  (1.626 m)   Wt 172 lb 6.4 oz (78.2 kg)   SpO2 97%   BMI 29.59 kg/m   BP Readings from Last 3 Encounters:  01/09/24 (!) 164/82  12/27/23 134/74  12/13/23 130/78    Wt Readings from Last 3 Encounters:  01/09/24 172 lb 6.4 oz (78.2 kg)  12/27/23 168 lb (76.2 kg)  12/13/23 170 lb 2 oz (77.2 kg)    Physical Exam Constitutional:      General: She is not in acute distress.    Appearance: She is well-developed.  HENT:     Head: Normocephalic.     Right Ear: External ear normal.     Left Ear: External ear normal.     Nose: Nose normal.  Eyes:     General:        Right eye: No discharge.  Left eye: No discharge.     Conjunctiva/sclera: Conjunctivae normal.     Pupils: Pupils are equal, round, and reactive to light.  Neck:     Thyroid: No thyromegaly.     Vascular: No JVD.     Trachea: No tracheal deviation.  Cardiovascular:     Rate and Rhythm: Normal rate and regular rhythm.     Heart sounds: Normal heart sounds.  Pulmonary:     Effort: No respiratory distress.     Breath sounds: No stridor. No wheezing.  Abdominal:     General: Bowel sounds are normal. There is no distension.     Palpations: Abdomen is soft. There is no mass.     Tenderness: There is no abdominal tenderness. There is no guarding or rebound.  Musculoskeletal:        General: No tenderness.     Cervical back: Normal range of motion and neck supple. No rigidity.  Lymphadenopathy:     Cervical: No cervical adenopathy.  Skin:     Findings: No erythema or rash.  Neurological:     Cranial Nerves: No cranial nerve deficit.     Motor: No abnormal muscle tone.     Coordination: Coordination normal.     Deep Tendon Reflexes: Reflexes normal.  Psychiatric:        Behavior: Behavior normal.        Thought Content: Thought content normal.        Judgment: Judgment normal.     Lab Results  Component Value Date   WBC 5.2 01/09/2024   HGB 14.6 01/09/2024   HCT 44.0 01/09/2024   PLT 335.0 01/09/2024   GLUCOSE 86 01/09/2024   CHOL 162 01/09/2024   TRIG 53.0 01/09/2024   HDL 69.50 01/09/2024   LDLCALC 82 01/09/2024   ALT 16 01/09/2024   AST 20 01/09/2024   NA 139 01/09/2024   K 4.3 01/09/2024   CL 102 01/09/2024   CREATININE 0.84 01/09/2024   BUN 20 01/09/2024   CO2 29 01/09/2024   TSH 2.67 01/09/2024    DG Bone Density Result Date: 08/21/2023 Table formatting from the original result was not included. Date of study: 08/19/2023 Exam: DUAL X-RAY ABSORPTIOMETRY (DXA) FOR BONE MINERAL DENSITY (BMD) Instrument: [employer] Requesting Provider: PCP Indication: follow up for low BMD Comparison: 2022 Clinical data: Pt is a 73 y.o. female without previous history of fracture. Results:  Lumbar spine L1-L4(L2,L3) Femoral neck (FN) T-score -1.4 RFN: -1.7 LFN: -1.4 Change in BMD from previous DXA test (%) Down 2.4% Down 3.5% (*) statistically significant Assessment: By the [facility] Criteria for diagnosis based on bone density, this patient has Low Bone Density FRAX 10-year fracture risk calculator: 11.6 % for any major fracture and 2.4 % for hip fracture. Pharmacologic therapy is recommended if 10 year fracture risk is >20% for any major osteoporotic fracture or >3% for hip fracture.  L2&L3 vertebrae had to be excluded from analysis due to DJD Comments: the technical quality of the study is good. WHO criteria for diagnosis of osteoporosis in postmenopausal women and in men 51 y/o or older: - normal: T-score -1.0 to + 1.0 -  osteopenia/low bone density: T-score between -2.5 and -1.0 - osteoporosis: T-score below -2.5 - severe osteoporosis: T-score below -2.5 with history of fragility fracture Note: although not part of the WHO classification, the presence of a fragility fracture, regardless of the T-score, should be considered diagnostic of osteoporosis, provided other causes for the fracture have been excluded.  RECOMMENDATION: 1. All patients should optimize calcium and vitamin D intake. 2. Consider FDA-approved medical therapies in postmenopausal women and men aged 28 years and older, based on the following: a. A hip or vertebral(clinical or morphometric) fracture. b. T-Score of  -2.5 or less at the femoral neck , total hip or spine after appropriate evaluation to exclude secondary causes c. Low bone mass (T-score between -1.0 and -2.5 at the femoral neck or spine) and a 10 year probability of a hip fracture >3% or a 10 year probability of major osteoporosis-related fracture > 20% based on the [place]-adapted WHO algorithm d. Clinical judgement and/or patient preferences may indicate treatment for people with 10-year fracture probabilities above or below these levels Follow up BMD is recommended: 2 years Interpreted by : Lyndle Herrlich, MD [facility]    Assessment & Plan:   Problem List Items Addressed This Visit     Well adult exam - Primary   Relevant Orders   TSH (Completed)   Urinalysis (Completed)   CBC with Differential/Platelet (Completed)   Lipid panel (Completed)   Comprehensive metabolic panel (Completed)   HTN (hypertension)   Relevant Medications   losartan (COZAAR) 25 MG tablet   Other Relevant Orders   Comprehensive metabolic panel (Completed)   Other Visit Diagnoses       Pain in right eye       Relevant Medications   cefUROXime (CEFTIN) 250 MG tablet     Dyslipidemia       Relevant Orders   TSH (Completed)   Lipid panel (Completed)         Meds ordered this encounter   Medications   celecoxib (CELEBREX) 200 MG capsule    Sig: Take 1 capsule (200 mg total) by mouth daily as needed for moderate pain (pain score 4-6).    Dispense:  30 capsule    Refill:  1    Attn: allergic to red dye   losartan (COZAAR) 25 MG tablet    Sig: Take 1 tablet (25 mg total) by mouth 2 (two) times daily.    Dispense:  180 tablet    Refill:  3   DISCONTD: ALPRAZolam (XANAX) 0.25 MG tablet    Sig: Take 1 tablet (0.25 mg total) by mouth 2 (two) times daily as needed for sleep or anxiety.    Dispense:  60 tablet    Refill:  2   ALPRAZolam (XANAX) 0.25 MG tablet    Sig: Take 1 tablet (0.25 mg total) by mouth 2 (two) times daily as needed for sleep or anxiety.    Dispense:  60 tablet    Refill:  2   cefUROXime (CEFTIN) 250 MG tablet    Sig: Take 1 tablet (250 mg total) by mouth 2 (two) times daily with a meal.    Dispense:  20 tablet    Refill:  1    White Tabs- Red dye allergy   methylPREDNISolone (MEDROL DOSEPAK) 4 MG TBPK tablet    Sig: TAKE 6 TABLETS ON DAY 1 AS DIRECTED ON PACKAGE AND DECREASE BY 1 TAB EACH DAY FOR A TOTAL OF 6 DAYS    Dispense:  21 each    Refill:  0   ipratropium (ATROVENT) 0.06 % nasal spray    Sig: Place 2 sprays into the nose 3 (three) times daily.    Dispense:  15 mL    Refill:  2      Follow-up: No follow-ups on file.  Sonda Primes, MD

## 2024-01-12 ENCOUNTER — Encounter: Payer: Self-pay | Admitting: Internal Medicine

## 2024-01-16 ENCOUNTER — Ambulatory Visit (INDEPENDENT_AMBULATORY_CARE_PROVIDER_SITE_OTHER): Payer: Medicare Other

## 2024-01-16 DIAGNOSIS — I441 Atrioventricular block, second degree: Secondary | ICD-10-CM | POA: Diagnosis not present

## 2024-01-16 LAB — CUP PACEART REMOTE DEVICE CHECK
Battery Remaining Longevity: 51 mo
Battery Voltage: 2.95 V
Brady Statistic AP VP Percent: 46.77 %
Brady Statistic AP VS Percent: 0 %
Brady Statistic AS VP Percent: 53.04 %
Brady Statistic AS VS Percent: 0.19 %
Brady Statistic RA Percent Paced: 46.77 %
Brady Statistic RV Percent Paced: 99.81 %
Date Time Interrogation Session: 20250205201846
Implantable Lead Connection Status: 753985
Implantable Lead Connection Status: 753985
Implantable Lead Implant Date: 20190801
Implantable Lead Implant Date: 20190801
Implantable Lead Location: 753859
Implantable Lead Location: 753860
Implantable Lead Model: 3830
Implantable Lead Model: 5076
Implantable Pulse Generator Implant Date: 20190801
Lead Channel Impedance Value: 342 Ohm
Lead Channel Impedance Value: 361 Ohm
Lead Channel Impedance Value: 380 Ohm
Lead Channel Impedance Value: 437 Ohm
Lead Channel Pacing Threshold Amplitude: 0.625 V
Lead Channel Pacing Threshold Amplitude: 0.75 V
Lead Channel Pacing Threshold Pulse Width: 0.4 ms
Lead Channel Pacing Threshold Pulse Width: 0.4 ms
Lead Channel Sensing Intrinsic Amplitude: 12.25 mV
Lead Channel Sensing Intrinsic Amplitude: 12.25 mV
Lead Channel Sensing Intrinsic Amplitude: 4.75 mV
Lead Channel Sensing Intrinsic Amplitude: 4.75 mV
Lead Channel Setting Pacing Amplitude: 2 V
Lead Channel Setting Pacing Amplitude: 2.5 V
Lead Channel Setting Pacing Pulse Width: 0.4 ms
Lead Channel Setting Sensing Sensitivity: 1.2 mV
Zone Setting Status: 755011
Zone Setting Status: 755011

## 2024-01-24 ENCOUNTER — Encounter: Payer: Self-pay | Admitting: Orthopedic Surgery

## 2024-01-24 ENCOUNTER — Other Ambulatory Visit: Payer: Self-pay | Admitting: Orthopedic Surgery

## 2024-01-24 ENCOUNTER — Encounter: Payer: Self-pay | Admitting: Internal Medicine

## 2024-01-24 DIAGNOSIS — S83249A Other tear of medial meniscus, current injury, unspecified knee, initial encounter: Secondary | ICD-10-CM

## 2024-01-24 DIAGNOSIS — M7120 Synovial cyst of popliteal space [Baker], unspecified knee: Secondary | ICD-10-CM

## 2024-01-26 ENCOUNTER — Encounter: Payer: Self-pay | Admitting: Cardiovascular Disease

## 2024-01-29 ENCOUNTER — Other Ambulatory Visit: Payer: Self-pay | Admitting: Internal Medicine

## 2024-01-29 MED ORDER — MUPIROCIN 2 % EX OINT: TOPICAL_OINTMENT | CUTANEOUS | 0 refills | Status: DC

## 2024-01-29 MED ORDER — MONTELUKAST SODIUM 10 MG PO TABS: 10.0000 mg | ORAL_TABLET | Freq: Every day | ORAL | 3 refills | Status: AC

## 2024-01-29 NOTE — Progress Notes (Signed)
 Refill Rx

## 2024-02-07 ENCOUNTER — Telehealth: Payer: Self-pay | Admitting: Internal Medicine

## 2024-02-07 NOTE — Telephone Encounter (Signed)
 Patient is calling in to get her a sooner appt she is feeling unwell

## 2024-02-18 ENCOUNTER — Ambulatory Visit (HOSPITAL_COMMUNITY)
Admission: RE | Admit: 2024-02-18 | Discharge: 2024-02-18 | Disposition: A | Discharge status: Home / Self Care | Payer: Medicare Other | Source: Ambulatory Visit | Attending: Orthopedic Surgery | Admitting: Orthopedic Surgery

## 2024-02-18 DIAGNOSIS — S83249A Other tear of medial meniscus, current injury, unspecified knee, initial encounter: Secondary | ICD-10-CM | POA: Diagnosis present

## 2024-02-18 DIAGNOSIS — M7120 Synovial cyst of popliteal space [Baker], unspecified knee: Secondary | ICD-10-CM | POA: Diagnosis present

## 2024-02-18 NOTE — Progress Notes (Signed)
  Device system confirmed to be MRI conditional, with implant date > 6 weeks ago, and no evidence of abandoned or epicardial leads in review of most recent CXR  Device last cleared by EP Provider:   Canary Brim NP   Clearance is good through for 1 year as long as parameters remain stable at time of check. If pt undergoes a cardiac device procedure during that time, they should be re-cleared.   Tachy-therapies to be programmed off if applicable with device back to pre-MRI settings after completion of exam.  [employer] - Programming recommendation received through [employer] App/Tablet  Terri Piedra  02/18/2024 8:18 AM

## 2024-02-20 NOTE — Progress Notes (Signed)
 Remote pacemaker transmission.

## 2024-02-22 ENCOUNTER — Other Ambulatory Visit: Payer: Self-pay | Admitting: Internal Medicine

## 2024-03-31 ENCOUNTER — Ambulatory Visit: Payer: Self-pay

## 2024-03-31 NOTE — Telephone Encounter (Signed)
  Chief Complaint: left lower pelvic pain Symptoms: pain 7/10, comes and goes, worse with sitting Frequency: 2 weeks ago Pertinent Negatives: Patient denies vomiting, diarrhea, back pain Disposition: [] ED /[] Urgent Care (no appt availability in office) / [x] Appointment(In office/virtual)/ []  [facility]/ [] Home Care/ [] Refused Recommended Disposition /[] [facility]/ []  Follow-up with PCP Additional Notes: pt wanting a pelvic - wanted a female provider.  Copied from CRM [id number]. Topic: Clinical - Red Word Triage >> Mar 31, 2024  1:25 PM Chuck Crater wrote: [facility] that prompted transfer to Nurse Triage: Patient went to her OBGYN because of a possible UTI and an ultrasound was done. They couldn't see patient ovary on her left side. She states that she has pain on the left side ( feels as if a knife is stabbing her). Reason for Disposition . Age > 60 years  Answer Assessment - Initial Assessment Questions 1. LOCATION: "Where does it hurt?"      Left side  2. RADIATION: "Does the pain shoot anywhere else?" (e.g., chest, back)     no 3. ONSET: "When did the pain begin?" (e.g., minutes, hours or days ago)      2 weeks ago  4. SUDDEN: "Gradual or sudden onset?"     Gradual  5. PATTERN "Does the pain come and go, or is it constant?"    - If it comes and goes: "How long does it last?" "Do you have pain now?"     (Note: Comes and goes means the pain is intermittent. It goes away completely between bouts.)    - If constant: "Is it getting better, staying the same, or getting worse?"      (Note: Constant means the pain never goes away completely; most serious pain is constant and gets worse.)      Comes and goes 6. SEVERITY: "How bad is the pain?"  (e.g., Scale 1-10; mild, moderate, or severe)    - MILD (1-3): Doesn't interfere with normal activities, abdomen soft and not tender to touch.     - MODERATE (4-7): Interferes with normal activities or awakens from sleep, abdomen  tender to touch.     - SEVERE (8-10): Excruciating pain, doubled over, unable to do any normal activities.       Siting is worse than standing    7/10 stabbing  7. RECURRENT SYMPTOM: "Have you ever had this type of stomach pain before?" If Yes, ask: "When was the last time?" and "What happened that time?"      no 8. CAUSE: "What do you think is causing the stomach pain?"     Worried about ovarian cancer or tumor 9. RELIEVING/AGGRAVATING FACTORS: "What makes it better or worse?" (e.g., antacids, bending or twisting motion, bowel movement)     Sitting worse 10. OTHER SYMPTOMS: "Do you have any other symptoms?" (e.g., back pain, diarrhea, fever, urination pain, vomiting)       Nausea  Protocols used: Abdominal Pain - Female-A-AH

## 2024-03-31 NOTE — Telephone Encounter (Addendum)
 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 Called and LM on nurses line to fax results to PCP. Gave office number and may call back.

## 2024-04-01 ENCOUNTER — Ambulatory Visit
Admission: RE | Admit: 2024-04-01 | Discharge: 2024-04-01 | Disposition: A | Discharge status: Home / Self Care | Source: Ambulatory Visit | Attending: Family Medicine | Admitting: Family Medicine

## 2024-04-01 ENCOUNTER — Ambulatory Visit (INDEPENDENT_AMBULATORY_CARE_PROVIDER_SITE_OTHER): Admitting: Family Medicine

## 2024-04-01 ENCOUNTER — Encounter: Payer: Self-pay | Admitting: Family Medicine

## 2024-04-01 VITALS — BP 142/92 | HR 69 | Temp 98.4°F | Ht 64.0 in | Wt 168.0 lb

## 2024-04-01 DIAGNOSIS — R1032 Left lower quadrant pain: Secondary | ICD-10-CM

## 2024-04-01 DIAGNOSIS — Z8719 Personal history of other diseases of the digestive system: Secondary | ICD-10-CM

## 2024-04-01 DIAGNOSIS — R102 Pelvic and perineal pain: Secondary | ICD-10-CM

## 2024-04-01 MED ORDER — IOPAMIDOL (ISOVUE-370) INJECTION 76%
500.0000 mL | Freq: Once | INTRAVENOUS | Status: AC | PRN
  Administered 2024-04-01: 80 mL via INTRAVENOUS

## 2024-04-01 NOTE — Patient Instructions (Signed)
 I have ordered a stat CT scan for you.  Someone will be reaching out to get you scheduled.  Will be in contact with results as soon as they are received.  Follow-up as needed.

## 2024-04-01 NOTE — Progress Notes (Signed)
 Acute Office Visit  Subjective:     Patient ID: Anna Ellis, female    DOB: 10-05-1951, 73 y.o.   MRN: [id number]  Chief Complaint  Patient presents with   Acute Visit    Pelvic pain, present for 2 weeks    HPI Patient is in today for evaluation of L pelvic pain, LLQ pain for the last 2 weeks.  Saw OBGYN 2 days ago with tenderness over the L ovary. Had transvaginal US  with incomplete visualization of the L ovary per pt.  Cannot access US  results.  Had urine culture with OBGYN, neg per pt Does have hx diverticulitis. Endorses nausea.  Denies gross hematuria, radiating pain, urinary urgency, frequency, chills, fever, vomiting, rash, other symptoms.    ROS Per HPI      Objective:    BP (!) 142/92 (BP Location: Left Arm, Patient Position: Sitting)   Pulse 69   Temp 98.4 F (36.9 C) (Temporal)   Ht 5\' 4"  (1.626 m)   Wt 168 lb (76.2 kg)   SpO2 98%   BMI 28.84 kg/m    Physical Exam Vitals and nursing note reviewed. Exam conducted with a chaperone present Garland Junk, CMA).  Constitutional:      General: She is not in acute distress.    Appearance: Normal appearance.  HENT:     Head: Normocephalic and atraumatic.  Eyes:     Extraocular Movements: Extraocular movements intact.  Cardiovascular:     Rate and Rhythm: Normal rate.  Pulmonary:     Effort: Pulmonary effort is normal.  Abdominal:     General: There is no distension.     Palpations: Abdomen is soft.     Tenderness: There is abdominal tenderness in the left lower quadrant. There is guarding. There is no right CVA tenderness, left CVA tenderness or rebound. Negative signs include Murphy's sign, Rovsing's sign, McBurney's sign, psoas sign and obturator sign.     Hernia: No hernia is present.       Comments: Area of TTP  Genitourinary:    Exam position: Lithotomy position.     Pubic Area: No rash.      Vagina: Normal. No signs of injury and foreign body. No vaginal discharge, erythema, tenderness,  bleeding, lesions or prolapsed vaginal walls.     Uterus: Absent.      Adnexa: Right adnexa normal and left adnexa normal.       Right: No mass, tenderness or fullness.         Left: No mass, tenderness or fullness.    Musculoskeletal:        General: Normal range of motion.     Cervical back: Normal range of motion.     Right lower leg: No edema.     Left lower leg: No edema.  Lymphadenopathy:     Cervical: No cervical adenopathy.  Neurological:     General: No focal deficit present.     Mental Status: She is alert and oriented to person, place, and time.  Psychiatric:        Mood and Affect: Mood normal.        Thought Content: Thought content normal.    No results found for any visits on 04/01/24.      Assessment & Plan:   Pelvic pain -     CT ABDOMEN PELVIS W CONTRAST; Future  History of diverticulitis -     CT ABDOMEN PELVIS W CONTRAST; Future  Left lower quadrant abdominal pain -  CT ABDOMEN PELVIS W CONTRAST; Future  Abd/pelvis CT ordered, will be in touch with results once they are received   No orders of the defined types were placed in this encounter.   Return if symptoms worsen or fail to improve.  Wellington Half, FNP

## 2024-04-02 LAB — HM MAMMOGRAPHY

## 2024-04-03 ENCOUNTER — Encounter: Payer: Self-pay | Admitting: Internal Medicine

## 2024-04-16 ENCOUNTER — Ambulatory Visit (INDEPENDENT_AMBULATORY_CARE_PROVIDER_SITE_OTHER): Payer: Medicare Other

## 2024-04-16 DIAGNOSIS — I441 Atrioventricular block, second degree: Secondary | ICD-10-CM | POA: Diagnosis not present

## 2024-04-16 LAB — CUP PACEART REMOTE DEVICE CHECK
Battery Remaining Longevity: 47 mo
Battery Voltage: 2.95 V
Brady Statistic AP VP Percent: 31.69 %
Brady Statistic AP VS Percent: 0 %
Brady Statistic AS VP Percent: 68.02 %
Brady Statistic AS VS Percent: 0.29 %
Brady Statistic RA Percent Paced: 31.7 %
Brady Statistic RV Percent Paced: 99.71 %
Date Time Interrogation Session: 20250508012359
Implantable Lead Connection Status: 753985
Implantable Lead Connection Status: 753985
Implantable Lead Implant Date: 20190801
Implantable Lead Implant Date: 20190801
Implantable Lead Location: 753859
Implantable Lead Location: 753860
Implantable Lead Model: 3830
Implantable Lead Model: 5076
Implantable Pulse Generator Implant Date: 20190801
Lead Channel Impedance Value: 342 Ohm
Lead Channel Impedance Value: 399 Ohm
Lead Channel Impedance Value: 399 Ohm
Lead Channel Impedance Value: 475 Ohm
Lead Channel Pacing Threshold Amplitude: 0.75 V
Lead Channel Pacing Threshold Amplitude: 0.875 V
Lead Channel Pacing Threshold Pulse Width: 0.4 ms
Lead Channel Pacing Threshold Pulse Width: 0.4 ms
Lead Channel Sensing Intrinsic Amplitude: 4.25 mV
Lead Channel Sensing Intrinsic Amplitude: 4.25 mV
Lead Channel Sensing Intrinsic Amplitude: 7.75 mV
Lead Channel Sensing Intrinsic Amplitude: 7.75 mV
Lead Channel Setting Pacing Amplitude: 2 V
Lead Channel Setting Pacing Amplitude: 2.5 V
Lead Channel Setting Pacing Pulse Width: 0.4 ms
Lead Channel Setting Sensing Sensitivity: 1.2 mV
Zone Setting Status: 755011
Zone Setting Status: 755011

## 2024-04-17 ENCOUNTER — Ambulatory Visit (INDEPENDENT_AMBULATORY_CARE_PROVIDER_SITE_OTHER): Admitting: Internal Medicine

## 2024-04-17 ENCOUNTER — Encounter: Payer: Self-pay | Admitting: Internal Medicine

## 2024-04-17 VITALS — BP 118/78 | HR 74 | Temp 98.2°F | Ht 64.0 in | Wt 166.0 lb

## 2024-04-17 DIAGNOSIS — I10 Essential (primary) hypertension: Secondary | ICD-10-CM

## 2024-04-17 DIAGNOSIS — R110 Nausea: Secondary | ICD-10-CM | POA: Diagnosis not present

## 2024-04-17 DIAGNOSIS — K5792 Diverticulitis of intestine, part unspecified, without perforation or abscess without bleeding: Secondary | ICD-10-CM | POA: Insufficient documentation

## 2024-04-17 MED ORDER — AMOXICILLIN-POT CLAVULANATE 875-125 MG PO TABS: 1.0000 | ORAL_TABLET | Freq: Two times a day (BID) | ORAL | 1 refills | Status: DC

## 2024-04-17 NOTE — Assessment & Plan Note (Signed)
 Resolved Pt had some nausea Zofran  prn

## 2024-04-17 NOTE — Assessment & Plan Note (Signed)
Continue with losartan.  Hydrate well

## 2024-04-17 NOTE — Progress Notes (Signed)
 Subjective:  Patient ID: Anna Ellis, female    DOB: 1951-07-05  Age: 73 y.o. MRN: [id number]  CC: Abdominal Pain (Left sided lower abdomen pain)   HPI Anna Ellis presents for LLQ pain off and on since April 16 th, severe at times. No fever. Worse w/food. Poor appetite...  Outpatient Medications Prior to Visit  Medication Sig Dispense Refill   acetaminophen  (TYLENOL ) 500 MG tablet Take 1,000 mg by mouth every 6 (six) hours as needed (for pain.).     ALPRAZolam  (XANAX ) 0.25 MG tablet Take 1 tablet (0.25 mg total) by mouth 2 (two) times daily as needed for sleep or anxiety. 60 tablet 2   aspirin  EC 81 MG tablet Take 81 mg by mouth daily.     atorvastatin  (LIPITOR) 10 MG tablet TAKE 1 TABLET BY MOUTH DAILY 90 tablet 3   celecoxib  (CELEBREX ) 200 MG capsule Take 1 capsule (200 mg total) by mouth daily as needed for moderate pain (pain score 4-6). 30 capsule 1   cholecalciferol (VITAMIN D3) 25 MCG (1000 UNIT) tablet Take 1,000 Units by mouth daily.     Famotidine (PEPCID AC PO) Take 1 tablet by mouth daily in the afternoon.     ipratropium (ATROVENT ) 0.06 % nasal spray Place 2 sprays into the nose 3 (three) times daily. 15 mL 2   losartan  (COZAAR ) 25 MG tablet TAKE 1 TABLET BY MOUTH EVERY DAY 90 tablet 3   metroNIDAZOLE  (METROCREAM ) 0.75 % cream Apply 1 application topically daily as needed (for rosacea). 45 g 3   montelukast  (SINGULAIR ) 10 MG tablet Take 1 tablet (10 mg total) by mouth daily. 90 tablet 3   mupirocin  ointment (BACTROBAN ) 2 % On leg wound w/dressing change qd or bid 30 g 0   omeprazole  (PRILOSEC) 20 MG capsule Take 1 capsule (20 mg total) by mouth daily. 90 capsule 3   Simethicone (GAS-X PO) Take 1 tablet by mouth daily in the afternoon.     vitamin E 1000 UNIT capsule Take 1,000 Units by mouth daily.     XIIDRA 5 % SOLN Two doses per tube - per eye DR     cefUROXime  (CEFTIN ) 250 MG tablet Take 1 tablet (250 mg total) by mouth 2 (two) times daily with a meal. (Patient  not taking: Reported on 04/17/2024) 20 tablet 1   methylPREDNISolone  (MEDROL  DOSEPAK) 4 MG TBPK tablet TAKE 6 TABLETS ON DAY 1 AS DIRECTED ON PACKAGE AND DECREASE BY 1 TAB EACH DAY FOR A TOTAL OF 6 DAYS (Patient not taking: Reported on 04/17/2024) 21 each 0   No facility-administered medications prior to visit.    ROS: Review of Systems  Constitutional:  Negative for activity change, appetite change, chills, fatigue and unexpected weight change.  HENT:  Negative for congestion, mouth sores and sinus pressure.   Eyes:  Negative for visual disturbance.  Respiratory:  Negative for cough and chest tightness.   Gastrointestinal:  Positive for abdominal pain. Negative for nausea.  Genitourinary:  Negative for difficulty urinating, frequency and vaginal pain.  Musculoskeletal:  Negative for back pain and gait problem.  Skin:  Negative for pallor and rash.  Neurological:  Negative for dizziness, tremors, weakness, numbness and headaches.  Psychiatric/Behavioral:  Negative for confusion and sleep disturbance.     Objective:  BP 118/78   Pulse 74   Temp 98.2 F (36.8 C) (Oral)   Ht 5\' 4"  (1.626 m)   Wt 166 lb (75.3 kg)   SpO2 97%  BMI 28.49 kg/m   BP Readings from Last 3 Encounters:  04/17/24 118/78  04/01/24 (!) 142/92  01/09/24 (!) 164/82    Wt Readings from Last 3 Encounters:  04/17/24 166 lb (75.3 kg)  04/01/24 168 lb (76.2 kg)  01/09/24 172 lb 6.4 oz (78.2 kg)    Physical Exam Constitutional:      General: She is not in acute distress.    Appearance: She is well-developed.  HENT:     Head: Normocephalic.     Right Ear: External ear normal.     Left Ear: External ear normal.     Nose: Nose normal.  Eyes:     General:        Right eye: No discharge.        Left eye: No discharge.     Conjunctiva/sclera: Conjunctivae normal.     Pupils: Pupils are equal, round, and reactive to light.  Neck:     Thyroid : No thyromegaly.     Vascular: No JVD.     Trachea: No tracheal  deviation.  Cardiovascular:     Rate and Rhythm: Normal rate and regular rhythm.     Heart sounds: Normal heart sounds.  Pulmonary:     Effort: No respiratory distress.     Breath sounds: No stridor. No wheezing.  Abdominal:     General: Bowel sounds are normal. There is no distension.     Palpations: Abdomen is soft. There is no mass.     Tenderness: There is no abdominal tenderness. There is no guarding or rebound.  Musculoskeletal:        General: No tenderness.     Cervical back: Normal range of motion and neck supple. No rigidity.  Lymphadenopathy:     Cervical: No cervical adenopathy.  Skin:    Findings: No erythema or rash.  Neurological:     Cranial Nerves: No cranial nerve deficit.     Motor: No abnormal muscle tone.     Coordination: Coordination normal.     Deep Tendon Reflexes: Reflexes normal.  Psychiatric:        Behavior: Behavior normal.        Thought Content: Thought content normal.        Judgment: Judgment normal.     Lab Results  Component Value Date   WBC 5.2 01/09/2024   HGB 14.6 01/09/2024   HCT 44.0 01/09/2024   PLT 335.0 01/09/2024   GLUCOSE 86 01/09/2024   CHOL 162 01/09/2024   TRIG 53.0 01/09/2024   HDL 69.50 01/09/2024   LDLCALC 82 01/09/2024   ALT 16 01/09/2024   AST 20 01/09/2024   NA 139 01/09/2024   K 4.3 01/09/2024   CL 102 01/09/2024   CREATININE 0.84 01/09/2024   BUN 20 01/09/2024   CO2 29 01/09/2024   TSH 2.67 01/09/2024    CT ABDOMEN PELVIS W CONTRAST Result Date: 04/01/2024 CLINICAL DATA:  LLQ abdominal pain Pelvic pain, acute, post-menopausal hx diverticulitis EXAM: CT ABDOMEN AND PELVIS WITH CONTRAST TECHNIQUE: Multidetector CT imaging of the abdomen and pelvis was performed using the standard protocol following bolus administration of intravenous contrast. RADIATION DOSE REDUCTION: This exam was performed according to the departmental dose-optimization program which includes automated exposure control, adjustment of the mA  and/or kV according to patient size and/or use of iterative reconstruction technique. CONTRAST:  80mL ISOVUE -370 IOPAMIDOL  (ISOVUE -370) INJECTION 76% COMPARISON:  CT abdomen and pelvis 08/25/2022. FINDINGS: Lower chest: No acute abnormality. Hepatobiliary: No focal liver abnormality is  seen. Status post cholecystectomy. No biliary dilatation. Pancreas: Unremarkable. No pancreatic ductal dilatation or surrounding inflammatory changes. Spleen: Normal in size without focal abnormality. Adrenals/Urinary Tract: Normal adrenal glands. Symmetric renal size and enhancement. No hydronephrosis. Nonobstructing 3 mm left lower pole intrarenal calculus. Unremarkable urinary bladder. Stomach/Bowel: Stomach is within normal limits. Appendix appears normal. Sigmoid diverticulosis. No evidence of bowel wall thickening, distention, or inflammatory changes. Vascular/Lymphatic: Aortic atherosclerosis. No enlarged abdominal or pelvic lymph nodes. Reproductive: Status post hysterectomy. No adnexal masses. Other: No abdominal wall hernia or abnormality. No abdominopelvic ascites. Musculoskeletal: No acute or significant osseous findings. Multilevel degenerative changes. IMPRESSION: 1. No acute abnormality in the abdomen or pelvis. 2. Nonobstructing left renal calculus.  No hydronephrosis. 3. Sigmoid diverticulosis.  No acute diverticulitis. Electronically Signed   By: Rox Cope M.D.   On: 04/01/2024 15:12    Assessment & Plan:   Problem List Items Addressed This Visit     Nausea   Resolved Pt had some nausea Zofran  prn      HTN (hypertension)   Continue with losartan .  Hydrate well      Diverticulitis - Primary   Acute - sigmoid colon (clinically) Augmentin  x 10 d Appt w/Dr Rosaline Coma pending Abd CT reviewed Pt had a recent pelvic exam         Meds ordered this encounter  Medications   amoxicillin -clavulanate (AUGMENTIN ) 875-125 MG tablet    Sig: Take 1 tablet by mouth 2 (two) times daily.    Dispense:   20 tablet    Refill:  1    White tablets please      Follow-up: No follow-ups on file.  Anitra Barn, MD

## 2024-04-17 NOTE — Assessment & Plan Note (Addendum)
 Acute - sigmoid colon (clinically) Augmentin  x 10 d Appt w/Dr Rosaline Coma pending Abd CT reviewed Pt had a recent pelvic exam

## 2024-04-24 ENCOUNTER — Ambulatory Visit: Payer: Self-pay | Admitting: Cardiovascular Disease

## 2024-04-24 ENCOUNTER — Encounter: Payer: Self-pay | Admitting: Internal Medicine

## 2024-04-24 ENCOUNTER — Ambulatory Visit (INDEPENDENT_AMBULATORY_CARE_PROVIDER_SITE_OTHER)
Admission: RE | Admit: 2024-04-24 | Discharge: 2024-04-24 | Disposition: A | Discharge status: Home / Self Care | Source: Ambulatory Visit | Attending: Physician Assistant | Admitting: Physician Assistant

## 2024-04-24 ENCOUNTER — Other Ambulatory Visit (INDEPENDENT_AMBULATORY_CARE_PROVIDER_SITE_OTHER)

## 2024-04-24 ENCOUNTER — Ambulatory Visit: Admitting: Physician Assistant

## 2024-04-24 ENCOUNTER — Encounter: Payer: Self-pay | Admitting: Physician Assistant

## 2024-04-24 VITALS — BP 140/80 | HR 93 | Ht 64.0 in | Wt 165.0 lb

## 2024-04-24 DIAGNOSIS — R1032 Left lower quadrant pain: Secondary | ICD-10-CM

## 2024-04-24 DIAGNOSIS — I441 Atrioventricular block, second degree: Secondary | ICD-10-CM

## 2024-04-24 DIAGNOSIS — K2970 Gastritis, unspecified, without bleeding: Secondary | ICD-10-CM

## 2024-04-24 DIAGNOSIS — I700 Atherosclerosis of aorta: Secondary | ICD-10-CM

## 2024-04-24 DIAGNOSIS — R195 Other fecal abnormalities: Secondary | ICD-10-CM | POA: Diagnosis not present

## 2024-04-24 DIAGNOSIS — R1013 Epigastric pain: Secondary | ICD-10-CM | POA: Diagnosis not present

## 2024-04-24 DIAGNOSIS — R110 Nausea: Secondary | ICD-10-CM | POA: Diagnosis not present

## 2024-04-24 DIAGNOSIS — K219 Gastro-esophageal reflux disease without esophagitis: Secondary | ICD-10-CM | POA: Diagnosis not present

## 2024-04-24 DIAGNOSIS — Z9049 Acquired absence of other specified parts of digestive tract: Secondary | ICD-10-CM

## 2024-04-24 LAB — CBC WITH DIFFERENTIAL/PLATELET
Basophils Absolute: 0.1 10*3/uL (ref 0.0–0.1)
Basophils Relative: 1.1 % (ref 0.0–3.0)
Eosinophils Absolute: 0.1 10*3/uL (ref 0.0–0.7)
Eosinophils Relative: 1.7 % (ref 0.0–5.0)
HCT: 44.9 % (ref 36.0–46.0)
Hemoglobin: 15.2 g/dL — ABNORMAL HIGH (ref 12.0–15.0)
Lymphocytes Relative: 25.8 % (ref 12.0–46.0)
Lymphs Abs: 1.4 10*3/uL (ref 0.7–4.0)
MCHC: 33.9 g/dL (ref 30.0–36.0)
MCV: 95.1 fl (ref 78.0–100.0)
Monocytes Absolute: 0.6 10*3/uL (ref 0.1–1.0)
Monocytes Relative: 11.9 % (ref 3.0–12.0)
Neutro Abs: 3.2 10*3/uL (ref 1.4–7.7)
Neutrophils Relative %: 59.5 % (ref 43.0–77.0)
Platelets: 340 10*3/uL (ref 150.0–400.0)
RBC: 4.72 Mil/uL (ref 3.87–5.11)
RDW: 13.5 % (ref 11.5–15.5)
WBC: 5.4 10*3/uL (ref 4.0–10.5)

## 2024-04-24 LAB — COMPREHENSIVE METABOLIC PANEL WITH GFR
ALT: 16 U/L (ref 0–35)
AST: 16 U/L (ref 0–37)
Albumin: 4.7 g/dL (ref 3.5–5.2)
Alkaline Phosphatase: 55 U/L (ref 39–117)
BUN: 11 mg/dL (ref 6–23)
CO2: 30 meq/L (ref 19–32)
Calcium: 9.7 mg/dL (ref 8.4–10.5)
Chloride: 103 meq/L (ref 96–112)
Creatinine, Ser: 0.69 mg/dL (ref 0.40–1.20)
GFR: 85.24 mL/min (ref >60.00)
Glucose, Bld: 97 mg/dL (ref 70–99)
Potassium: 3.6 meq/L (ref 3.5–5.1)
Sodium: 141 meq/L (ref 135–145)
Total Bilirubin: 0.6 mg/dL (ref 0.2–1.2)
Total Protein: 7.2 g/dL (ref 6.0–8.3)

## 2024-04-24 LAB — SEDIMENTATION RATE: Sed Rate: 4 mm/h (ref 0–30)

## 2024-04-24 MED ORDER — NA SULFATE-K SULFATE-MG SULF 17.5-3.13-1.6 GM/177ML PO SOLN: 1.0000 | Freq: Once | ORAL | 0 refills | Status: AC

## 2024-04-24 NOTE — Patient Instructions (Addendum)
 Your provider has requested that you go to the basement level for lab work before leaving today. Press "B" on the elevator. The lab is located at the first door on the left as you exit the elevator.  Your provider has requested that you have an abdominal x ray before leaving today. Please go to the basement floor to our Radiology department for the test.  I will go over  No aleve, ibuprofen , goody powders, as these are antiinflammatories and can cause inflammation in your stomach, increase bleeding risk and cause ulcers.  You can talk with PCP about alternative pain options.  Can do tyelnol max 3000 mg a day, salon pas patches are over the counter and voltern gel is topical antiinflammatory that is safe.   Shows aortic atherosclerosis, this is plaque build up on the large vessel that runs from your heart down the length of your body, this is a warning sign, suggest stop smoking if you are, control blood pressure, cholesterol, sugars, increase exercise. Can discuss with PCP. Can ask your PCP about cardiac calcium  score  Miralax is an osmotic laxative.  It only brings more water into the stool.  This is safe to take daily.  Can take up to 17 gram of miralax twice a day.  Mix with juice or coffee.  Start 1 capful at night for 3-4 days and reassess your response in 3-4 days.  You can increase and decrease the dose based on your response.  Remember, it can take up to 3-4 days to take effect OR for the effects to wear off.   I often pair this with benefiber in the morning to help assure the stool is not too loose.   First do a trial off milk/lactose products if you use them.  Add fiber like benefiber or citracel once a day Increase activity Can do trial of IBGard which is over the counter for AB pain- Take 1-2 capsules once a day for maintence or twice a day during a flare    FODMAP stands for fermentable oligo-, di-, mono-saccharides and polyols (1). These are the scientific terms used to  classify groups of carbs that are difficult for our body to digest and that are notorious for triggering digestive symptoms like bloating, gas, loose stools and stomach pain.   You can try low FODMAP diet  - start with eliminating just one column at a time that you feel may be a trigger for you. - the table at the very bottom contains foods that are low in FODMAPs   Sometimes trying to eliminate the FODMAP's from your diet is difficult or tricky, if you are stuggling with trying to do the elimination diet you can try an enzyme.  There is a food enzymes that you sprinkle in or on your food that helps break down the FODMAP. You can read more about the enzyme by going to this site: https://fodzyme.com/   Due to recent changes in healthcare laws, you may see the results of your imaging and laboratory studies on MyChart before your provider has had a chance to review them.  We understand that in some cases there may be results that are confusing or concerning to you. Not all laboratory results come back in the same time frame and the provider may be waiting for multiple results in order to interpret others.  Please give us  48 hours in order for your provider to thoroughly review all the results before contacting the office for clarification of your results.

## 2024-04-24 NOTE — Progress Notes (Signed)
 04/24/2024 Anna Ellis [id number] Apr 19, 1951  Referring provider: Genia Kettering, MD Primary GI doctor: Dr. Rosaline Coma  ASSESSMENT AND PLAN:  GERD, nausea, epigastric pain with history of duodenal stricture  status post cholecystectomy  05/04/2022 RUQ US  no biliary ductal lesion or liver mass limited by gas  CT AP with contrast 9/15/20202023 nonobstructive left nephrolithiasis sigmoid diverticulosis no diverticula is tiny hiatal hernia aortic atherosclerosis 06/28/2022 EGD 2 cm hiatal hernia gastritis, acquired duodenal stenosis biopsied H. pylori negative, extensive gastric metaplasia and focal changes of duodenum consistent with peptic duodenitis active gastritis negative H. pylori no metaplasia, NSAIDS were stopped -No GERD, no epigastric pain - avoid NSAIDS, continue PPI - follow up if any worsening symptoms  LLQ pain x  April 16th, sharp pain, could wake her up, worse with pushing on it, bending over, no radiation, associated nausea, no vomiting, no fever, chills. Normal BM daily but can have constipation/diarrhea with occ blood in the stool, worsening constipation with augmentin , had diarrhea last night Treated with macrobid but UTI negative so stopped pelvic US  normal, pap normal negative CT 04/23 without diverticulitis, treated augmentin  05/09 though negative CT, pain still there 09/12/2017 colonoscopy diverticulosis sigmoid colon internal hemorrhoids scarring from prior banding repeat 10 years  04/01/2024 CT abdomen pelvis with contrast left lower quadrant pain diverticulosis without diverticulitis - possible constipation, IBS, MSK - salon pas patches -Check CBC, CMET, Sed rate - add on miralax/fiber, FODMAP given, IBGard discussed - get KUB evaluate stool burden - schedule for [facility] at [facility] with Dr. Rosaline Coma for change in bowel habits, AB pain, We have discussed the risks of bleeding, infection, perforation, medication reactions, and remote risk of death associated  with colonoscopy. All questions were answered and the patient acknowledges these risk and wishes to proceed.  Aortic atheroclerosis Shows aortic atherosclerosis, this is plaque build up on the large vessel that runs from your heart down the length of your body, this is a warning sign, suggest stop smoking if you are, control blood pressure, cholesterol, sugars, increase exercise. Can discuss with PCP.   Mobitz type II status post PPM  follows with Dr. Arlester Ladd  Patient Care Team: Plotnikov, Oakley Bellman, MD as PCP - General Mealor, Donnamae Gaba, MD as PCP - Electrophysiology (Cardiology) Vernell Goldsmith, MD (Pulmonary Disease) Gonzalo Lather, MD as Attending Physician (Urology) Rogene Claude, MD (Obstetrics and Gynecology) Daina Drum, MD as Consulting Physician (Gastroenterology)  HISTORY OF PRESENT ILLNESS: 73 y.o. female with a past medical history listed below presents for evaluation of abdominal pain.  Last seen in the office 12/13/2023 by Dr. Rosaline Coma for abdominal pain  Discussed the use of AI scribe software for clinical note transcription with the patient, who gave verbal consent to proceed.  History of Present Illness   Anna Ellis is a 73 year old female with diverticulosis who presents with lower abdominal pain.  She has been experiencing sharp lower abdominal pain since April 16th, 2025. Initially, she suspected a urinary tract infection due to the nature of the pain. A gynecologist performed an internal exam, noting tenderness but normal ovaries. A urine test showed a small amount of blood, leading to a prescription of Macrobid, which was discontinued after a culture showed no infection.  A vaginal ultrasound was performed, but the left ovary was not visible. An abdominal CT scan revealed a 3mm kidney stone in the left kidney and diverticulosis, but no acute diverticulitis. Despite this, the pain persisted, described as a 'knife at  my side', and was exacerbated by leaning  or bending over.  She was prescribed Augmentin  and has been on it for a week. During this time, she experienced constipation for two to three days, followed by diarrhea, which provided some relief. The pain is sharper than her usual diverticulitis episodes and sometimes radiates to the right side. She also reports nausea, but no vomiting, fever, or chills.  Her bowel movements were typically regular, occurring daily, but she experienced changes during this episode, including constipation and diarrhea. She has a history of diverticulosis with episodes of diarrhea and minor rectal bleeding, which usually resolve without treatment.  She has a history of knee issues and was previously on anti-inflammatory medications, which were stopped due to gastrointestinal concerns. 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 She has been taking Lipitor for cholesterol management for many years.       She  reports that she has never smoked. She has never used smokeless tobacco. She reports that she does not drink alcohol and does not use drugs.  RELEVANT GI HISTORY, IMAGING AND LABS: Results   LABS Urine culture: Negative for urinary tract infection (03/30/2024)  RADIOLOGY Abdominal CT scan: 3 mm kidney stone in left kidney, diverticulosis, no acute diverticulitis (04/17/2024) Vaginal ultrasound: Left ovary not visualized (03/31/2024)      CBC    Component Value Date/Time   WBC 5.2 01/09/2024 0947   RBC 4.53 01/09/2024 0947   HGB 14.6 01/09/2024 0947   HCT 44.0 01/09/2024 0947   PLT 335.0 01/09/2024 0947   MCV 97.1 01/09/2024 0947   MCHC 33.3 01/09/2024 0947   RDW 13.4 01/09/2024 0947   LYMPHSABS 1.3 01/09/2024 0947   MONOABS 0.6 01/09/2024 0947   EOSABS 0.3 01/09/2024 0947   BASOSABS 0.0 01/09/2024 0947   Recent Labs    06/03/23 1155 06/11/23 1514 01/09/24 0947  HGB 15.4* 14.0 14.6    CMP     Component Value Date/Time   NA 139 01/09/2024 0947   K 4.3  01/09/2024 0947   CL 102 01/09/2024 0947   CO2 29 01/09/2024 0947   GLUCOSE 86 01/09/2024 0947   GLUCOSE 90 10/15/2006 0751   BUN 20 01/09/2024 0947   CREATININE 0.84 01/09/2024 0947   CALCIUM  9.9 01/09/2024 0947   PROT 6.9 01/09/2024 0947   ALBUMIN 4.4 01/09/2024 0947   AST 20 01/09/2024 0947   ALT 16 01/09/2024 0947   ALKPHOS 61 01/09/2024 0947   BILITOT 0.7 01/09/2024 0947   GFRNONAA >60 07/11/2018 0346   GFRAA >60 07/11/2018 0346      Latest Ref Rng & Units 01/09/2024    9:47 AM 06/11/2023    3:14 PM 06/03/2023   11:55 AM  Hepatic Function  Total Protein 6.0 - 8.3 g/dL 6.9  7.2  7.8   Albumin 3.5 - 5.2 g/dL 4.4  4.6  4.9   AST 0 - 37 U/L 20  21  21    ALT 0 - 35 U/L 16  15  16    Alk Phosphatase 39 - 117 U/L 61  65  74   Total Bilirubin 0.2 - 1.2 mg/dL 0.7  0.5  0.6       Current Medications:   Current Outpatient Medications (Endocrine & Metabolic):    methylPREDNISolone  (MEDROL  DOSEPAK) 4 MG TBPK tablet, TAKE 6 TABLETS ON DAY 1 AS DIRECTED ON PACKAGE AND DECREASE BY 1 TAB EACH DAY FOR A TOTAL OF 6 DAYS  Current Outpatient Medications (Cardiovascular):    atorvastatin  (LIPITOR) 10 MG tablet, TAKE 1 TABLET BY MOUTH DAILY   losartan  (COZAAR ) 25 MG tablet, TAKE 1 TABLET BY MOUTH EVERY DAY  Current Outpatient Medications (Respiratory):    ipratropium (ATROVENT ) 0.06 % nasal spray, Place 2 sprays into the nose 3 (three) times daily.   montelukast  (SINGULAIR ) 10 MG tablet, Take 1 tablet (10 mg total) by mouth daily.  Current Outpatient Medications (Analgesics):    acetaminophen  (TYLENOL ) 500 MG tablet, Take 1,000 mg by mouth every 6 (six) hours as needed (for pain.).   aspirin  EC 81 MG tablet, Take 81 mg by mouth daily.   celecoxib  (CELEBREX ) 200 MG capsule, Take 1 capsule (200 mg total) by mouth daily as needed for moderate pain (pain score 4-6).   Current Outpatient Medications (Other):    ALPRAZolam  (XANAX ) 0.25 MG tablet, Take 1 tablet (0.25 mg total) by mouth 2 (two)  times daily as needed for sleep or anxiety.   amoxicillin -clavulanate (AUGMENTIN ) 875-125 MG tablet, Take 1 tablet by mouth 2 (two) times daily.   cefUROXime  (CEFTIN ) 250 MG tablet, Take 1 tablet (250 mg total) by mouth 2 (two) times daily with a meal.   cholecalciferol (VITAMIN D3) 25 MCG (1000 UNIT) tablet, Take 1,000 Units by mouth daily.   Famotidine (PEPCID AC PO), Take 1 tablet by mouth daily in the afternoon.   metroNIDAZOLE  (METROCREAM ) 0.75 % cream, Apply 1 application topically daily as needed (for rosacea).   mupirocin  ointment (BACTROBAN ) 2 %, On leg wound w/dressing change qd or bid   Na Sulfate-K Sulfate-Mg Sulfate concentrate (SUPREP) 17.5-3.13-1.6 GM/177ML SOLN, Take 1 kit (354 mLs total) by mouth once for 1 dose.   omeprazole  (PRILOSEC) 20 MG capsule, Take 1 capsule (20 mg total) by mouth daily.   Simethicone (GAS-X PO), Take 1 tablet by mouth daily in the afternoon.   vitamin E 1000 UNIT capsule, Take 1,000 Units by mouth daily.   XIIDRA 5 % SOLN, Two doses per tube - per eye DR  Medical History:  Past Medical History:  Diagnosis Date   ALLERGIC RHINITIS 09/19/2007   ANEMIA-IRON DEFICIENCY 09/19/2007   ANXIETY 09/19/2007   Arthritis    "neck" (07/10/2018)   Basal cell cancer    "back of my neck; scraped it off"   CAROTID BRUIT 01/08/2011   Chronic headaches    GERD (gastroesophageal reflux disease)    High blood pressure    History of gallstones    Hyperlipidemia    Mobitz II    a. s/p MDT dual chamber (His Bundle) pacemaker 07/2018 - Dr Nunzio Belch   OSTEOARTHRITIS 09/19/2007   Pneumonia    Allergies:  Allergies  Allergen Reactions   Clarithromycin Hives   Etodolac      Gastritis    Food Color Red Hives   Sulfonamide Derivatives Hives   Cobalt Rash    Found on Allergy Test   Parabens Rash     Surgical History:  She  has a past surgical history that includes Anterior cruciate ligament repair (Left, 2001); Wisdom tooth extraction (1967); LEFT HEART CATH AND  CORONARY ANGIOGRAPHY (N/A, 07/08/2018); Tear duct probing (1955); Tonsillectomy and adenoidectomy (1968); Laparoscopic cholecystectomy (1994); Inguinal hernia repair (Right, 1971); Knee cartilage surgery (Left, 1986); Breast lumpectomy (Bilateral, 2002); Vaginal hysterectomy (1990); Tubal ligation (1982); PACEMAKER IMPLANT (N/A, 07/10/2018); Colonoscopy (09/12/2017); Esophagogastroduodenoscopy (1994); and Upper gi endoscopy (06/28/2022). Family History:  Her family history includes Allergies in her son; Asthma in her son; Breast cancer in her  mother; Colon cancer (age of onset: 46 - 57) in her paternal aunt; Heart disease in her father; Pneumonia in her paternal grandmother; Throat cancer in her maternal grandfather; Tuberculosis in her maternal grandmother.  REVIEW OF SYSTEMS  : All other systems reviewed and negative except where noted in the History of Present Illness.  PHYSICAL EXAM: BP (!) 140/80   Pulse 93   Ht 5\' 4"  (1.626 m)   Wt 165 lb (74.8 kg)   BMI 28.32 kg/m  Physical Exam   GENERAL APPEARANCE: Well nourished, in no apparent distress. HEENT: No cervical lymphadenopathy, unremarkable thyroid , sclerae anicteric, conjunctiva pink. RESPIRATORY: Respiratory effort normal, breath sounds equal bilaterally without rales, rhonchi, or wheezing. CARDIO: Regular rate and rhythm with no murmurs, rubs, or gallops, peripheral pulses intact. ABDOMEN: Soft, non-distended, active bowel sounds in all four quadrants, tenderness to palpation, pain with abdominal muscle engagement, no rebound, no mass appreciated. RECTAL: Declines. MUSCULOSKELETAL: Full range of motion, normal gait, without edema, hip normal range of motion. SKIN: Dry, intact without rashes or lesions. No jaundice. NEURO: Alert, oriented, no focal deficits. PSYCH: Cooperative, normal mood and affect.      Edmonia Gottron, PA-C 11:29 AM

## 2024-04-27 ENCOUNTER — Telehealth: Payer: Self-pay | Admitting: Physician Assistant

## 2024-04-27 ENCOUNTER — Ambulatory Visit: Payer: Self-pay | Admitting: Physician Assistant

## 2024-04-27 ENCOUNTER — Encounter: Payer: Self-pay | Admitting: *Deleted

## 2024-04-27 NOTE — Telephone Encounter (Signed)
 Patient rescheduled for procedure. Please update prep instructions.   Thank you

## 2024-04-27 NOTE — Progress Notes (Signed)
 I agree with the assessment and plan as outlined by Ms. Steffanie Dunn.

## 2024-04-27 NOTE — Telephone Encounter (Signed)
 Printed new instructions for patient with the new date and time.   Called patient to inform and she said she can see her new instructions on My Chart. Patient confirmed she already has her Suprep kit.

## 2024-04-28 LAB — TSH: TSH: 1.62 u[IU]/mL (ref 0.35–5.50)

## 2024-04-30 ENCOUNTER — Telehealth: Payer: Self-pay | Admitting: *Deleted

## 2024-04-30 NOTE — Telephone Encounter (Signed)
   Pre-operative Risk Assessment    Patient Name: Anna Ellis  DOB: 1951/03/01 MRN: [id number]   Date of last office visit: 12/2023 Date of next office visit: N/A   Request for Surgical Clearance    Procedure:  LEFT TOTAL KNEE REPLACEMENT  Date of Surgery:  Clearance 07/13/24                                Surgeon:  DR. MATTHEW OLIN Surgeon's Group or Practice Name:  [facility] Phone number:  [phone] Fax number:  [phone]   Type of Clearance Requested:   - Medical  - Pharmacy:  [facility]  NOT INDICATED   Type of Anesthesia:  Spinal   Additional requests/questions:    SignedMaeola Schmidt   04/30/2024, 11:04 AM

## 2024-05-01 ENCOUNTER — Encounter: Admitting: Internal Medicine

## 2024-05-01 ENCOUNTER — Ambulatory Visit (AMBULATORY_SURGERY_CENTER): Admitting: Internal Medicine

## 2024-05-01 ENCOUNTER — Encounter: Payer: Self-pay | Admitting: Internal Medicine

## 2024-05-01 VITALS — BP 129/75 | HR 78 | Temp 97.0°F | Resp 18 | Ht 64.0 in | Wt 165.0 lb

## 2024-05-01 DIAGNOSIS — K635 Polyp of colon: Secondary | ICD-10-CM

## 2024-05-01 DIAGNOSIS — D123 Benign neoplasm of transverse colon: Secondary | ICD-10-CM

## 2024-05-01 DIAGNOSIS — R195 Other fecal abnormalities: Secondary | ICD-10-CM

## 2024-05-01 DIAGNOSIS — K648 Other hemorrhoids: Secondary | ICD-10-CM

## 2024-05-01 DIAGNOSIS — K5730 Diverticulosis of large intestine without perforation or abscess without bleeding: Secondary | ICD-10-CM

## 2024-05-01 MED ORDER — SODIUM CHLORIDE 0.9 % IV SOLN: 500.0000 mL | Freq: Once | INTRAVENOUS | Status: DC

## 2024-05-01 NOTE — Patient Instructions (Addendum)
 Resume previous diet and medications. Awaiting pathology results.  Follow up on GI office on 07/17/24 at 10:50- Call office if you need to reschedule this appointment.  YOU HAD AN ENDOSCOPIC PROCEDURE TODAY AT THE [facility]:   Refer to the procedure report that was given to you for any specific questions about what was found during the examination.  If the procedure report does not answer your questions, please call your gastroenterologist to clarify.  If you requested that your care partner not be given the details of your procedure findings, then the procedure report has been included in a sealed envelope for you to review at your convenience later.  YOU SHOULD EXPECT: Some feelings of bloating in the abdomen. Passage of more gas than usual.  Walking can help get rid of the air that was put into your GI tract during the procedure and reduce the bloating. If you had a lower endoscopy (such as a colonoscopy or flexible sigmoidoscopy) you may notice spotting of blood in your stool or on the toilet paper. If you underwent a bowel prep for your procedure, you may not have a normal bowel movement for a few days.  Please Note:  You might notice some irritation and congestion in your nose or some drainage.  This is from the oxygen used during your procedure.  There is no need for concern and it should clear up in a day or so.  SYMPTOMS TO REPORT IMMEDIATELY:  Following lower endoscopy (colonoscopy or flexible sigmoidoscopy):  Excessive amounts of blood in the stool  Significant tenderness or worsening of abdominal pains  Swelling of the abdomen that is new, acute  Fever of 100F or higher  For urgent or emergent issues, a gastroenterologist can be reached at any hour by calling [phone]. Do not use MyChart messaging for urgent concerns.    DIET:  We do recommend a small meal at first, but then you may proceed to your regular diet.  Drink plenty of fluids but you should avoid  alcoholic beverages for 24 hours.  ACTIVITY:  You should plan to take it easy for the rest of today and you should NOT DRIVE or use heavy machinery until tomorrow (because of the sedation medicines used during the test).    FOLLOW UP: Our staff will call the number listed on your records the next business day following your procedure.  We will call around 7:15- 8:00 am to check on you and address any questions or concerns that you may have regarding the information given to you following your procedure. If we do not reach you, we will leave a message.     If any biopsies were taken you will be contacted by phone or by letter within the next 1-3 weeks.  Please call us  at [phone] if you have not heard about the biopsies in 3 weeks.    SIGNATURES/CONFIDENTIALITY: You and/or your care partner have signed paperwork which will be entered into your electronic medical record.  These signatures attest to the fact that that the information above on your After Visit Summary has been reviewed and is understood.  Full responsibility of the confidentiality of this discharge information lies with you and/or your care-partner.

## 2024-05-01 NOTE — Op Note (Signed)
 [facility] Patient Name: Anna Ellis Procedure Date: 05/01/2024 7:48 AM MRN: [id number] Endoscopist: Freada Jacobs [place] , , [id number] Age: 73 Referring MD:  Date of Birth: [birth year] Gender: Female Account #: [id number] Procedure:                Colonoscopy Indications:              Change in bowel habits Medicines:                Monitored Anesthesia Care Procedure:                Pre-Anesthesia Assessment:                           - Prior to the procedure, a History and Physical                            was performed, and patient medications and                            allergies were reviewed. The patient's tolerance of                            previous anesthesia was also reviewed. The risks                            and benefits of the procedure and the sedation                            options and risks were discussed with the patient.                            All questions were answered, and informed consent                            was obtained. Prior Anticoagulants: The patient has                            taken no anticoagulant or antiplatelet agents. ASA                            Grade Assessment: III - A patient with severe                            systemic disease. After reviewing the risks and                            benefits, the patient was deemed in satisfactory                            condition to undergo the procedure.                           After obtaining informed consent, the colonoscope  was passed under direct vision. Throughout the                            procedure, the patient's blood pressure, pulse, and                            oxygen saturations were monitored continuously. The                            CF [id number] #[id number] was introduced through the anus                            and advanced to the the terminal ileum. The                            colonoscopy was performed  without difficulty. The                            patient tolerated the procedure well. The quality                            of the bowel preparation was excellent. The                            terminal ileum, ileocecal valve, appendiceal                            orifice, and rectum were photographed. Scope In: 8:28:04 AM Scope Out: 8:42:33 AM Scope Withdrawal Time: 0 hours 10 minutes 19 seconds  Total Procedure Duration: 0 hours 14 minutes 29 seconds  Findings:                 The terminal ileum appeared normal.                           Multiple diverticula were found in the sigmoid                            colon and ascending colon.                           Two sessile polyps were found in the transverse                            colon. The polyps were 3 to 5 mm in size. These                            polyps were removed with a cold snare. Resection                            and retrieval were complete.                           Biopsies for histology were taken with a cold  forceps from the entire colon for evaluation of                            microscopic colitis.                           Non-bleeding internal hemorrhoids were found during                            retroflexion. Complications:            No immediate complications. Estimated Blood Loss:     Estimated blood loss was minimal. Impression:               - The examined portion of the ileum was normal.                           - Diverticulosis in the sigmoid colon and in the                            ascending colon.                           - Two 3 to 5 mm polyps in the transverse colon,                            removed with a cold snare. Resected and retrieved.                           - Non-bleeding internal hemorrhoids.                           - Biopsies were taken with a cold forceps from the                            entire colon for evaluation of microscopic  colitis. Recommendation:           - Discharge patient to home (with escort).                           - Await pathology results.                           - Return to GI clinic in 2-3 months.                           - The findings and recommendations were discussed                            with the patient. Dr Pedro Bourgeois "[place]" [place],  05/01/2024 8:51:18 AM

## 2024-05-01 NOTE — Progress Notes (Signed)
 Pt's states no medical or surgical changes since previsit or office visit.

## 2024-05-01 NOTE — Progress Notes (Signed)
 Pt sedate, gd SR's, VSS, report to RN

## 2024-05-01 NOTE — Progress Notes (Signed)
 Called to room to assist during endoscopic procedure.  Patient ID and intended procedure confirmed with present staff. Received instructions for my participation in the procedure from the performing physician.

## 2024-05-01 NOTE — Progress Notes (Signed)
 GASTROENTEROLOGY PROCEDURE H&P NOTE   Primary Care Physician: Genia Kettering, MD    Reason for Procedure:   LLQ ab pain  Plan:    Colonoscopy  Patient is appropriate for endoscopic procedure(s) in the ambulatory ([facility]) setting.  The nature of the procedure, as well as the risks, benefits, and alternatives were carefully and thoroughly reviewed with the patient. Ample time for discussion and questions allowed. The patient understood, was satisfied, and agreed to proceed.     HPI: Anna Ellis is a 73 y.o. female who presents for colonoscopy for evaluation of LLQ ab pain.  Patient was most recently seen in the Gastroenterology Clinic on 04/24/24.  No interval change in medical history since that appointment. Please refer to that note for full details regarding GI history and clinical presentation.   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 have diverticulosis   ESOPHAGOGASTRODUODENOSCOPY  1994   INGUINAL HERNIA REPAIR Right 1971   KNEE CARTILAGE SURGERY Left 1986   w/ACL repair   LAPAROSCOPIC CHOLECYSTECTOMY  1994   LEFT HEART CATH AND CORONARY ANGIOGRAPHY N/A 07/08/2018   Procedure: LEFT HEART CATH AND CORONARY ANGIOGRAPHY;  Surgeon: Arleen Lacer, MD;  Location: [facility];  Service:  Cardiovascular;; Angiographically normal coronary arteries except ~20-25% prox & mid LAD.  Normal LVEDP.   PACEMAKER IMPLANT N/A 07/10/2018   Medtronic Azure XT DR MRI SureScan model [id number] (serial number [id number]) pacemaker by Dr Nunzio Belch with His bundle lead   TEAR DUCT PROBING  1955   TONSILLECTOMY AND ADENOIDECTOMY  1968   TUBAL LIGATION  1982   UPPER GI ENDOSCOPY  06/28/2022   VAGINAL HYSTERECTOMY  1990   WISDOM TOOTH EXTRACTION  1967    Prior to Admission medications   Medication Sig Start Date End Date Taking? Authorizing Provider  acetaminophen  (TYLENOL ) 500 MG tablet Take 1,000 mg by mouth every 6 (six) hours as needed (for pain.).   Yes [provider]  ALPRAZolam  (XANAX ) 0.25 MG tablet Take 1 tablet (0.25 mg total) by mouth 2 (two) times daily as needed for sleep or anxiety. 01/09/24  Yes Plotnikov, Oakley Bellman, MD  aspirin  EC 81 MG tablet Take 81 mg by mouth daily.   Yes [provider]  atorvastatin  (LIPITOR) 10 MG tablet TAKE 1 TABLET BY MOUTH DAILY 11/10/23  Yes Plotnikov, Aleksei V, MD  Famotidine (PEPCID AC PO) Take 1 tablet by mouth daily in the afternoon.   Yes [provider]  losartan  (COZAAR ) 25 MG tablet TAKE 1 TABLET BY MOUTH EVERY DAY 02/24/24  Yes Plotnikov, Aleksei V, MD  montelukast  (SINGULAIR ) 10 MG tablet Take 1 tablet (10 mg total) by mouth daily. 01/29/24  01/28/25 Yes Plotnikov, Oakley Bellman, MD  omeprazole  (PRILOSEC) 20 MG capsule Take 1 capsule (20 mg total) by mouth daily. 12/13/23  Yes Daina Drum, MD  vitamin E 1000 UNIT capsule Take 1,000 Units by mouth daily.   Yes [provider]  XIIDRA 5 % SOLN Two doses per tube - per eye DR 05/23/23  Yes [provider]  amoxicillin -clavulanate (AUGMENTIN ) 875-125 MG tablet Take 1 tablet by mouth 2 (two) times daily. 04/17/24   Plotnikov, Aleksei V, MD  cefUROXime  (CEFTIN ) 250 MG tablet Take 1 tablet (250 mg total) by mouth 2 (two) times daily with a meal. 01/09/24   Plotnikov,  Oakley Bellman, MD  celecoxib  (CELEBREX ) 200 MG capsule Take 1 capsule (200 mg total) by mouth daily as needed for moderate pain (pain score 4-6). 01/09/24   Plotnikov, Aleksei V, MD  cholecalciferol (VITAMIN D3) 25 MCG (1000 UNIT) tablet Take 1,000 Units by mouth daily.    [provider]  ipratropium (ATROVENT ) 0.06 % nasal spray Place 2 sprays into the nose 3 (three) times daily. 01/09/24 01/08/25  Plotnikov, Aleksei V, MD  methylPREDNISolone  (MEDROL  DOSEPAK) 4 MG TBPK tablet TAKE 6 TABLETS ON DAY 1 AS DIRECTED ON PACKAGE AND DECREASE BY 1 TAB EACH DAY FOR A TOTAL OF 6 DAYS 01/09/24   Plotnikov, Aleksei V, MD  metroNIDAZOLE  (METROCREAM ) 0.75 % cream Apply 1 application topically daily as needed (for rosacea). 11/04/19   Plotnikov, Aleksei V, MD  mupirocin  ointment (BACTROBAN ) 2 % On leg wound w/dressing change qd or bid 01/29/24   Plotnikov, Aleksei V, MD  Simethicone (GAS-X PO) Take 1 tablet by mouth daily in the afternoon.    [provider]    Current Outpatient Medications  Medication Sig Dispense Refill   acetaminophen  (TYLENOL ) 500 MG tablet Take 1,000 mg by mouth every 6 (six) hours as needed (for pain.).     ALPRAZolam  (XANAX ) 0.25 MG tablet Take 1 tablet (0.25 mg total) by mouth 2 (two) times daily as needed for sleep or anxiety. 60 tablet 2   aspirin  EC 81 MG tablet Take 81 mg by mouth daily.     atorvastatin  (LIPITOR) 10 MG tablet TAKE 1 TABLET BY MOUTH DAILY 90 tablet 3   Famotidine (PEPCID AC PO) Take 1 tablet by mouth daily in the afternoon.     losartan  (COZAAR ) 25 MG tablet TAKE 1 TABLET BY MOUTH EVERY DAY 90 tablet 3   montelukast  (SINGULAIR ) 10 MG tablet Take 1 tablet (10 mg total) by mouth daily. 90 tablet 3   omeprazole  (PRILOSEC) 20 MG capsule Take 1 capsule (20 mg total) by mouth daily. 90 capsule 3   vitamin E 1000 UNIT capsule Take 1,000 Units by mouth daily.     XIIDRA 5 % SOLN Two doses per tube - per eye DR     amoxicillin -clavulanate (AUGMENTIN ) 875-125 MG  tablet Take 1 tablet by mouth 2 (two) times daily. 20 tablet 1   cefUROXime  (CEFTIN ) 250 MG tablet Take 1 tablet (250 mg total) by mouth 2 (two) times daily with a meal. 20 tablet 1   celecoxib  (CELEBREX ) 200 MG capsule Take 1 capsule (200 mg total) by mouth daily as needed for moderate pain (pain score 4-6). 30 capsule 1   cholecalciferol (VITAMIN D3) 25 MCG (1000 UNIT) tablet Take 1,000 Units by mouth daily.     ipratropium (ATROVENT ) 0.06 % nasal spray Place 2 sprays into the nose 3 (three) times daily. 15 mL 2   methylPREDNISolone  (  MEDROL  DOSEPAK) 4 MG TBPK tablet TAKE 6 TABLETS ON DAY 1 AS DIRECTED ON PACKAGE AND DECREASE BY 1 TAB EACH DAY FOR A TOTAL OF 6 DAYS 21 each 0   metroNIDAZOLE  (METROCREAM ) 0.75 % cream Apply 1 application topically daily as needed (for rosacea). 45 g 3   mupirocin  ointment (BACTROBAN ) 2 % On leg wound w/dressing change qd or bid 30 g 0   Simethicone (GAS-X PO) Take 1 tablet by mouth daily in the afternoon.     Current Facility-Administered Medications  Medication Dose Route Frequency Provider Last Rate Last Admin   0.9 %  sodium chloride  infusion  500 mL Intravenous Once Daina Drum, MD        Allergies as of 05/01/2024 - Review Complete 05/01/2024  Allergen Reaction Noted   Clarithromycin Hives 09/19/2007   Cobalt Rash 01/23/2016   Etodolac  Other (See Comments) 01/07/2023   Food color red Hives 09/19/2007   Parabens Rash 01/23/2016   Sulfonamide derivatives Hives 09/19/2007    Family History  Problem Relation Age of Onset   Breast cancer Mother        breast   Heart disease Father        h/o Rheumatic Fever - Rheumatic MV Dz.   Colon cancer Paternal Aunt 43 - 89   Tuberculosis Maternal Grandmother        died in her 21s   Throat cancer Maternal Grandfather    Pneumonia Paternal Grandmother    Allergies Son    Asthma Son    Esophageal cancer Neg Hx    Rectal cancer Neg Hx    Stomach cancer Neg Hx     Social History   Socioeconomic  History   Marital status: Married    Spouse name: Not on file   Number of children: 3   Years of education: Not on file   Highest education level: 12th grade  Occupational History   Occupation: Retired    Associate Professor: RETIRED    Comment: Environmental health practitioner  Tobacco Use   Smoking status: Never   Smokeless tobacco: Never  Vaping Use   Vaping status: Never Used  Substance and Sexual Activity   Alcohol use: Never   Drug use: Never   Sexual activity: Yes  Other Topics Concern   Not on file  Social History Narrative   She is a married (49 years).  They have 3 children and 5 grandchildren.  She is a retired Horticulturist, commercial for [employer].   She regularly exercises walking and using stairs etc.   Social Drivers of Health   Financial Resource Strain: Low Risk  (03/31/2024)   Overall Financial Resource Strain (CARDIA)    Difficulty of Paying Living Expenses: Not hard at all  Food Insecurity: No Food Insecurity (03/31/2024)   Hunger Vital Sign    Worried About Running Out of Food in the Last Year: Never true    Ran Out of Food in the Last Year: Never true  Transportation Needs: No Transportation Needs (03/31/2024)   PRAPARE - Administrator, Civil Service (Medical): No    Lack of Transportation (Non-Medical): No  Physical Activity: Unknown (03/31/2024)   Exercise Vital Sign    Days of Exercise per Week: 0 days    Minutes of Exercise per Session: Not on file  Stress: No Stress Concern Present (03/31/2024)   [employer] of Occupational Health - Occupational Stress Questionnaire    Feeling of Stress : Not at all  Social  Connections: Socially Integrated (03/31/2024)   Social Connection and Isolation Panel [NHANES]    Frequency of Communication with Friends and Family: More than three times a week    Frequency of Social Gatherings with Friends and Family: More than three times a week    Attends Religious Services: More than 4 times per year    Active  Member of [employer] or Organizations: Yes    Attends Engineer, structural: More than 4 times per year    Marital Status: Married  Catering manager Violence: Not on file    Physical Exam: Vital signs in last 24 hours: BP (!) 160/86   Pulse 99   Temp (!) 97 F (36.1 C) (Temporal)   Resp 12   Ht 5\' 4"  (1.626 m)   Wt 165 lb (74.8 kg)   SpO2 97%   BMI 28.32 kg/m  GEN: NAD EYE: Sclerae anicteric ENT: MMM CV: Non-tachycardic Pulm: No increased WOB GI: Soft NEURO:  Alert & Oriented   Regino Caprio, MD  [facility]   05/01/2024 8:25 AM

## 2024-05-05 ENCOUNTER — Telehealth: Payer: Self-pay

## 2024-05-05 NOTE — Telephone Encounter (Signed)
   Name: Anna Ellis  DOB: 1951-01-07  MRN: [id number]  Primary Cardiologist: None   Preoperative team, please contact this patient and set up a phone call appointment for further preoperative risk assessment. Please obtain consent and complete medication review. Thank you for your help.  Please schedule televisit around the end of July for scheduled August procedure date.  I confirm that guidance regarding antiplatelet and oral anticoagulation therapy has been completed and, if necessary, noted below.  Regarding ASA therapy, we recommend continuation of ASA throughout the perioperative period.  However, if the surgeon feels that cessation of ASA is required in the perioperative period, it may be stopped 5-7 days prior to surgery with a plan to resume it as soon as felt to be feasible from a surgical standpoint in the post-operative period.   I also confirmed the patient resides in the state of [place] . As per [facility] Medical Board telemedicine laws, the patient must reside in the state in which the provider is licensed.   Francene Ing, Retha Cast, NP 05/05/2024, 8:25 AM [facility]

## 2024-05-05 NOTE — Telephone Encounter (Signed)
 Med Rec and Consent done    Patient Consent for Virtual Visit        Anna Ellis has provided verbal consent on 05/05/2024 for a virtual visit (video or telephone).   CONSENT FOR VIRTUAL VISIT FOR:  Anna Ellis  By participating in this virtual visit I agree to the following:  I hereby voluntarily request, consent and authorize [facility] and its employed or contracted physicians, physician assistants, nurse practitioners or other licensed health care professionals (the Practitioner), to provide me with telemedicine health care services (the "Services") as deemed necessary by the treating Practitioner. I acknowledge and consent to receive the Services by the Practitioner via telemedicine. I understand that the telemedicine visit will involve communicating with the Practitioner through live audiovisual communication technology and the disclosure of certain medical information by electronic transmission. I acknowledge that I have been given the opportunity to request an in-person assessment or other available alternative prior to the telemedicine visit and am voluntarily participating in the telemedicine visit.  I understand that I have the right to withhold or withdraw my consent to the use of telemedicine in the course of my care at any time, without affecting my right to future care or treatment, and that the Practitioner or I may terminate the telemedicine visit at any time. I understand that I have the right to inspect all information obtained and/or recorded in the course of the telemedicine visit and may receive copies of available information for a reasonable fee.  I understand that some of the potential risks of receiving the Services via telemedicine include:  Delay or interruption in medical evaluation due to technological equipment failure or disruption; Information transmitted may not be sufficient (e.g. poor resolution of images) to allow for appropriate medical  decision making by the Practitioner; and/or  In rare instances, security protocols could fail, causing a breach of personal health information.  Furthermore, I acknowledge that it is my responsibility to provide information about my medical history, conditions and care that is complete and accurate to the best of my ability. I acknowledge that Practitioner's advice, recommendations, and/or decision may be based on factors not within their control, such as incomplete or inaccurate data provided by me or distortions of diagnostic images or specimens that may result from electronic transmissions. I understand that the practice of medicine is not an exact science and that Practitioner makes no warranties or guarantees regarding treatment outcomes. I acknowledge that a copy of this consent can be made available to me via my patient portal [facility] MyChart), or I can request a printed copy by calling the office of [facility].    I understand that my insurance will be billed for this visit.   I have read or had this consent read to me. I understand the contents of this consent, which adequately explains the benefits and risks of the Services being provided via telemedicine.  I have been provided ample opportunity to ask questions regarding this consent and the Services and have had my questions answered to my satisfaction. I give my informed consent for the services to be provided through the use of telemedicine in my medical care

## 2024-05-05 NOTE — Telephone Encounter (Signed)
 Left message on follow up call.

## 2024-05-05 NOTE — Telephone Encounter (Signed)
 Spoke with pt and scheduled TELE Preop appt 06/29/24. Med Rec and Consent done

## 2024-05-06 ENCOUNTER — Ambulatory Visit: Payer: Self-pay | Admitting: Internal Medicine

## 2024-05-06 LAB — SURGICAL PATHOLOGY

## 2024-05-22 ENCOUNTER — Encounter: Admitting: Internal Medicine

## 2024-05-25 NOTE — Progress Notes (Signed)
 Remote pacemaker transmission.

## 2024-05-27 ENCOUNTER — Other Ambulatory Visit: Payer: Self-pay

## 2024-05-27 ENCOUNTER — Telehealth: Payer: Self-pay | Admitting: Internal Medicine

## 2024-05-27 DIAGNOSIS — R1011 Right upper quadrant pain: Secondary | ICD-10-CM

## 2024-05-27 DIAGNOSIS — K219 Gastro-esophageal reflux disease without esophagitis: Secondary | ICD-10-CM

## 2024-05-27 DIAGNOSIS — R1013 Epigastric pain: Secondary | ICD-10-CM

## 2024-05-27 MED ORDER — OMEPRAZOLE 40 MG PO CPDR: 40.0000 mg | DELAYED_RELEASE_CAPSULE | Freq: Two times a day (BID) | ORAL | 3 refills | Status: DC

## 2024-05-27 NOTE — Telephone Encounter (Signed)
 05/04/2022 right upper quadrant ultrasound normal CT abdomen pelvis 2023 unremarkable EGD 2023 2 cm hiatal hernia, gastritis acquired duodenal stenosis  Please assure the patient is not taking anti-inflammatories. Will send in omeprazole  40 mg to take twice daily 30 minutes an hour before food Can continue Pepcid in the evening. Please provide GERD lifestyle changes  Please repeat right upper quadrant ultrasound review Offer EGD with Dr. Rosaline Coma when available for further evaluation. Go to the ER if you have coffee ground vomiting, unable to keep down food or drink, black tarry stools, dizziness, severe pain, shortness of breath or chest pain.

## 2024-05-27 NOTE — Telephone Encounter (Signed)
 Patient requesting f/u call to discuss abdominal pain. Please advise.   Thank you

## 2024-05-27 NOTE — Telephone Encounter (Signed)
 Pt made aware of Santina Cull recommendations:  Prescription sent to pharmacy. Pt made aware. RUQ US  scheduled for 06/04/2024 at 8:30 at [facility].  Pt to arrive at 8:00 AM.Nothing to eat or drink past midnight.  Pt was scheduled for the EGD with Dr. Rosaline Coma on 06/29/2024 at 2:30 in the [facility]. Pt made aware. Ambulatory referral to GI placed in Epic. Pt made aware. Prep instructions was sent to pt via my chart. Pt made aware.  Pt verbalized understanding with all questions answered.

## 2024-05-27 NOTE — Telephone Encounter (Signed)
 Pt stated that for the last 3 weeks that she has been having an  increase in heartburn and epigastric pain after she eats. Pt stated that she had an old prescription for the omeprazole  40 mg that she started taking.( Typically takes Omeprazole  20 mg). Pt stated that she has been taking the  Pepcid at night also. Pt stated that what she is currently doing is not helping and requesting something else.  Please review and advise

## 2024-06-04 ENCOUNTER — Ambulatory Visit (HOSPITAL_COMMUNITY)
Admission: RE | Admit: 2024-06-04 | Discharge: 2024-06-04 | Disposition: A | Discharge status: Home / Self Care | Source: Ambulatory Visit | Attending: Physician Assistant | Admitting: Physician Assistant

## 2024-06-04 DIAGNOSIS — R1011 Right upper quadrant pain: Secondary | ICD-10-CM | POA: Insufficient documentation

## 2024-06-04 DIAGNOSIS — R1013 Epigastric pain: Secondary | ICD-10-CM | POA: Insufficient documentation

## 2024-06-04 DIAGNOSIS — K219 Gastro-esophageal reflux disease without esophagitis: Secondary | ICD-10-CM | POA: Diagnosis present

## 2024-06-05 ENCOUNTER — Ambulatory Visit: Payer: Self-pay | Admitting: Physician Assistant

## 2024-06-18 NOTE — Progress Notes (Signed)
 06/19/2024 Anna Ellis [id number] April 05, 1951  Referring provider: [name], MD Primary GI doctor: Dr. Federico  ASSESSMENT AND PLAN:  GERD with history of duodenal stricture, resolved  status post cholecystectomy  05/04/2022 RUQ US  no biliary ductal lesion or liver mass limited by gas 04/01/2024 CTAB WC left lower quadrant pain diverticulosis without diverticulitis 06/28/2022 EGD 2 cm hiatal hernia gastritis, acquired duodenal stenosis biopsied H. pylori negative, extensive gastric metaplasia and focal changes of duodenum consistent with peptic duodenitis active gastritis negative H. pylori no metaplasia, NSAIDS were stopped  Uncertain what may have triggered this but patient now has dysphagia, AB discomfort, not responsive to PPI BID - schedule for EGD 06/29/2024, I discussed risks of EGD with patient today, including risk of sedation, bleeding or perforation.  Patient provides understanding and gave verbal consent to proceed. - will give alginate therapy - consider SIBO testing/treatment - continue the PPI twice a day, add pepcid - continue gas X  LLQ pain improved after colonoscopy but then she was burning esophageal pain, bloating, general AB pain 09/12/2017 colonoscopy diverticulosis sigmoid colon internal hemorrhoids scarring from prior banding repeat 10 years 04/01/2024 CTAB WC left lower quadrant pain diverticulosis without diverticulitis 05/01/2024 colonoscopy normal TI, diverticulosis, 2 polyps (1 TA 1 benign) 3 to 5 mm transverse nonbleeding internal hemorrhoids, pathology shows no microscopic colitis or active inflammation 04/24/2024 KUB small to moderate volume of stool Likely constipation, IBS, pelvic floor versus musculoskeletal Has improved with the colonoscopy, likely constipation/IBS/pelvis floor Was not able to tolerate miralax due to movement, not started  - add on magnesium at night - add on benefiber - consider linzess, declines at this  time  Aortic atheroclerosis/mildCAD Shows aortic atherosclerosis, this is plaque build up on the large vessel that runs from your heart down the length of your body, this is a warning sign, suggest stop smoking if you are, control blood pressure, cholesterol, sugars, increase exercise. Can discuss with PCP. Cardiath cath 2019 20 % plaque No issues with exertion, No dizziness, no sweating, no chest pain, non exertional    Mobitz type II status post PPM  follows with Dr. Nancey  Patient Care Team: Plotnikov, Karlynn GAILS, MD as PCP - General Mealor, Eulas BRAVO, MD as PCP - Electrophysiology (Cardiology) Brien Belvie BRAVO, MD (Pulmonary Disease) Eliza Sieving, MD as Attending Physician (Urology) Estelle Service, MD (Obstetrics and Gynecology) Federico Rosario BROCKS, MD as Consulting Physician (Gastroenterology)  HISTORY OF PRESENT ILLNESS: 73 y.o. female with a past medical history listed below presents for evaluation of abdominal pain.   Last seen in the office 04/24/2024 by myself in the office for GERD and epigastric discomfort with left lower quadrant abdominal pain.  Colonoscopy unremarkable  Discussed the use of AI scribe software for clinical note transcription with the patient, who gave verbal consent to proceed.  History of Present Illness   The patient, with a history of reflux and a pacemaker for secondary heart block, presents with persistent abdominal pain and gastrointestinal symptoms following a colonoscopy.  She experiences persistent left lower quadrant abdominal pain, which initially improved after a colonoscopy in May but worsened a few days later. The pain is described as a sensation of being 'pumped up full of air' with radiation to the shoulders and a feeling of pressure. It is not constant and tends to worsen in the afternoon and evening, often resolving by the next morning.  She has a history of constipation, with alternating days of normal and 'knocked' bowel  movements.  She tried Miralax gummies, which caused significant bowel movements, and has not yet tried Benefiber given by a friend. She experiences frequent burping and occasional nausea but no vomiting. She denies any new medications and has not taken Celebrex  despite it being prescribed for knee pain.  She reports a sensation of food being stuck in her throat, which sometimes resolves after eating, and experiences indigestion with certain foods inconsistently.  Her past medical history includes a pacemaker for secondary heart block, aortic atherosclerosis, and a cardiac catheterization in 2019 showing 20-25% stenosis in the LAD. She has had a previous endoscopy in 2023, which showed a duodenal stricture, hiatal hernia, and a Schatzki ring. No family history of gallbladder issues as her gallbladder has been removed. She is scheduled for a knee replacement but is concerned about proceeding due to her current gastrointestinal symptoms.      She  reports that she has never smoked. She has never used smokeless tobacco. She reports that she does not drink alcohol and does not use drugs.  RELEVANT GI HISTORY, IMAGING AND LABS: Results   RADIOLOGY KUB: Moderate volume of stool (06/24/2024) Ultrasound: Normal (04/2024)  DIAGNOSTIC Colonoscopy: Internal hemorrhoids, small polyps, negative for inflammation (04/2024) Cardiac catheterization: Proximal LAD 20% stenosis, mid LAD 25% stenosis (2019)      CBC    Component Value Date/Time   WBC 5.4 04/24/2024 1031   RBC 4.72 04/24/2024 1031   HGB 15.2 (H) 04/24/2024 1031   HCT 44.9 04/24/2024 1031   PLT 340.0 04/24/2024 1031   MCV 95.1 04/24/2024 1031   MCHC 33.9 04/24/2024 1031   RDW 13.5 04/24/2024 1031   LYMPHSABS 1.4 04/24/2024 1031   MONOABS 0.6 04/24/2024 1031   EOSABS 0.1 04/24/2024 1031   BASOSABS 0.1 04/24/2024 1031   Recent Labs    01/09/24 0947 04/24/24 1031  HGB 14.6 15.2*    CMP     Component Value Date/Time   NA 141 04/24/2024 1031    K 3.6 04/24/2024 1031   CL 103 04/24/2024 1031   CO2 30 04/24/2024 1031   GLUCOSE 97 04/24/2024 1031   GLUCOSE 90 10/15/2006 0751   BUN 11 04/24/2024 1031   CREATININE 0.69 04/24/2024 1031   CALCIUM  9.7 04/24/2024 1031   PROT 7.2 04/24/2024 1031   ALBUMIN 4.7 04/24/2024 1031   AST 16 04/24/2024 1031   ALT 16 04/24/2024 1031   ALKPHOS 55 04/24/2024 1031   BILITOT 0.6 04/24/2024 1031   GFRNONAA >60 07/11/2018 0346   GFRAA >60 07/11/2018 0346      Latest Ref Rng & Units 04/24/2024   10:31 AM 01/09/2024    9:47 AM 06/11/2023    3:14 PM  Hepatic Function  Total Protein 6.0 - 8.3 g/dL 7.2  6.9  7.2   Albumin 3.5 - 5.2 g/dL 4.7  4.4  4.6   AST 0 - 37 U/L 16  20  21    ALT 0 - 35 U/L 16  16  15    Alk Phosphatase 39 - 117 U/L 55  61  65   Total Bilirubin 0.2 - 1.2 mg/dL 0.6  0.7  0.5       Current Medications:    Current Outpatient Medications (Cardiovascular):    atorvastatin  (LIPITOR) 10 MG tablet, TAKE 1 TABLET BY MOUTH DAILY   losartan  (COZAAR ) 25 MG tablet, TAKE 1 TABLET BY MOUTH EVERY DAY  Current Outpatient Medications (Respiratory):    ipratropium (ATROVENT ) 0.06 % nasal spray, Place 2 sprays into  the nose 3 (three) times daily.   montelukast  (SINGULAIR ) 10 MG tablet, Take 1 tablet (10 mg total) by mouth daily.  Current Outpatient Medications (Analgesics):    acetaminophen  (TYLENOL ) 500 MG tablet, Take 1,000 mg by mouth every 6 (six) hours as needed (for pain.).   aspirin  EC 81 MG tablet, Take 81 mg by mouth daily.   celecoxib  (CELEBREX ) 200 MG capsule, Take 1 capsule (200 mg total) by mouth daily as needed for moderate pain (pain score 4-6). (Patient not taking: Reported on 06/19/2024)   Current Outpatient Medications (Other):    ALPRAZolam  (XANAX ) 0.25 MG tablet, Take 1 tablet (0.25 mg total) by mouth 2 (two) times daily as needed for sleep or anxiety.   cholecalciferol (VITAMIN D3) 25 MCG (1000 UNIT) tablet, Take 1,000 Units by mouth daily.   Famotidine (PEPCID AC PO),  Take 1 tablet by mouth daily in the afternoon.   omeprazole  (PRILOSEC) 40 MG capsule, Take 1 capsule (40 mg total) by mouth 2 (two) times daily.   Simethicone (GAS-X PO), Take 1 tablet by mouth daily in the afternoon.   vitamin E 1000 UNIT capsule, Take 1,000 Units by mouth daily.   XIIDRA 5 % SOLN, Two doses per tube - per eye DR   metroNIDAZOLE  (METROCREAM ) 0.75 % cream, Apply 1 application topically daily as needed (for rosacea). (Patient not taking: Reported on 06/19/2024)   mupirocin  ointment (BACTROBAN ) 2 %, On leg wound w/dressing change qd or bid (Patient not taking: Reported on 06/19/2024)  Medical History:  Past Medical History:  Diagnosis Date   ALLERGIC RHINITIS 09/19/2007   ANEMIA-IRON DEFICIENCY 09/19/2007   ANXIETY 09/19/2007   Arthritis    neck (07/10/2018)   Basal cell cancer    back of my neck; scraped it off   CAROTID BRUIT 01/08/2011   Chronic headaches    GERD (gastroesophageal reflux disease)    High blood pressure    History of gallstones    Hyperlipidemia    Mobitz II    a. s/p MDT dual chamber (His Bundle) pacemaker 07/2018 - Dr Kelsie   OSTEOARTHRITIS 09/19/2007   Pneumonia    Allergies:  Allergies  Allergen Reactions   Clarithromycin Hives   Cobalt Rash    Found on Allergy Test   Etodolac  Other (See Comments)    Gastritis    Food Color Red Hives   Parabens Rash   Sulfonamide Derivatives Hives     Surgical History:  She  has a past surgical history that includes Anterior cruciate ligament repair (Left, 2001); Wisdom tooth extraction (1967); LEFT HEART CATH AND CORONARY ANGIOGRAPHY (N/A, 07/08/2018); Tear duct probing (1955); Tonsillectomy and adenoidectomy (1968); Laparoscopic cholecystectomy (1994); Inguinal hernia repair (Right, 1971); Knee cartilage surgery (Left, 1986); Breast lumpectomy (Bilateral, 2002); Vaginal hysterectomy (1990); Tubal ligation (1982); PACEMAKER IMPLANT (N/A, 07/10/2018); Colonoscopy (09/12/2017); Esophagogastroduodenoscopy  (1994); and Upper gi endoscopy (06/28/2022). Family History:  Her family history includes Allergies in her son; Asthma in her son; Breast cancer in her mother; Colon cancer (age of onset: 44 - 60) in her paternal aunt; Heart disease in her father; Pneumonia in her paternal grandmother; Throat cancer in her maternal grandfather; Tuberculosis in her maternal grandmother.  REVIEW OF SYSTEMS  : All other systems reviewed and negative except where noted in the History of Present Illness.  PHYSICAL EXAM: BP 120/70   Pulse 75   Ht 5' 4 (1.626 m)   Wt 164 lb (74.4 kg)   BMI 28.15 kg/m  Physical Exam  GENERAL APPEARANCE: Well nourished, in no apparent distress. HEENT: No cervical lymphadenopathy, unremarkable thyroid , sclerae anicteric, conjunctiva pink. RESPIRATORY: Respiratory effort normal, breath sounds clear to auscultation bilaterally without rales, rhonchi, or wheezing. CARDIO: Regular rate and rhythm with no murmurs, rubs, or gallops, peripheral pulses intact. ABDOMEN: Soft, non-distended, active bowel sounds in all four quadrants, mild tenderness on palpation of left abdomen, no rebound, no mass appreciated. RECTAL: Declines. MUSCULOSKELETAL: Full range of motion, normal gait, without edema. SKIN: Dry, intact without rashes or lesions. No jaundice. NEURO: Alert, oriented, no focal deficits. PSYCH: Cooperative, normal mood and affect.      Alan JONELLE Coombs, PA-C 3:29 PM

## 2024-06-19 ENCOUNTER — Ambulatory Visit (INDEPENDENT_AMBULATORY_CARE_PROVIDER_SITE_OTHER): Admitting: Physician Assistant

## 2024-06-19 ENCOUNTER — Encounter: Payer: Self-pay | Admitting: Physician Assistant

## 2024-06-19 ENCOUNTER — Other Ambulatory Visit (INDEPENDENT_AMBULATORY_CARE_PROVIDER_SITE_OTHER)

## 2024-06-19 ENCOUNTER — Ambulatory Visit: Payer: Self-pay | Admitting: Physician Assistant

## 2024-06-19 VITALS — BP 120/70 | HR 75 | Ht 64.0 in | Wt 164.0 lb

## 2024-06-19 DIAGNOSIS — R1032 Left lower quadrant pain: Secondary | ICD-10-CM | POA: Diagnosis not present

## 2024-06-19 DIAGNOSIS — K315 Obstruction of duodenum: Secondary | ICD-10-CM

## 2024-06-19 DIAGNOSIS — R140 Abdominal distension (gaseous): Secondary | ICD-10-CM

## 2024-06-19 DIAGNOSIS — R109 Unspecified abdominal pain: Secondary | ICD-10-CM

## 2024-06-19 DIAGNOSIS — I441 Atrioventricular block, second degree: Secondary | ICD-10-CM | POA: Diagnosis not present

## 2024-06-19 DIAGNOSIS — K219 Gastro-esophageal reflux disease without esophagitis: Secondary | ICD-10-CM

## 2024-06-19 DIAGNOSIS — I700 Atherosclerosis of aorta: Secondary | ICD-10-CM | POA: Diagnosis not present

## 2024-06-19 DIAGNOSIS — Z9049 Acquired absence of other specified parts of digestive tract: Secondary | ICD-10-CM

## 2024-06-19 DIAGNOSIS — R1011 Right upper quadrant pain: Secondary | ICD-10-CM

## 2024-06-19 LAB — COMPREHENSIVE METABOLIC PANEL WITH GFR
ALT: 16 U/L (ref 0–35)
AST: 19 U/L (ref 0–37)
Albumin: 4.4 g/dL (ref 3.5–5.2)
Alkaline Phosphatase: 53 U/L (ref 39–117)
BUN: 16 mg/dL (ref 6–23)
CO2: 32 meq/L (ref 19–32)
Calcium: 9.6 mg/dL (ref 8.4–10.5)
Chloride: 104 meq/L (ref 96–112)
Creatinine, Ser: 0.89 mg/dL (ref 0.40–1.20)
GFR: 63.61 mL/min (ref >60.00)
Glucose, Bld: 90 mg/dL (ref 70–99)
Potassium: 3.8 meq/L (ref 3.5–5.1)
Sodium: 141 meq/L (ref 135–145)
Total Bilirubin: 0.5 mg/dL (ref 0.2–1.2)
Total Protein: 6.6 g/dL (ref 6.0–8.3)

## 2024-06-19 LAB — CBC WITH DIFFERENTIAL/PLATELET
Basophils Absolute: 0.1 K/uL (ref 0.0–0.1)
Basophils Relative: 1 % (ref 0.0–3.0)
Eosinophils Absolute: 0.1 K/uL (ref 0.0–0.7)
Eosinophils Relative: 1.6 % (ref 0.0–5.0)
HCT: 42.8 % (ref 36.0–46.0)
Hemoglobin: 14.4 g/dL (ref 12.0–15.0)
Lymphocytes Relative: 22.5 % (ref 12.0–46.0)
Lymphs Abs: 1.5 K/uL (ref 0.7–4.0)
MCHC: 33.6 g/dL (ref 30.0–36.0)
MCV: 95.2 fl (ref 78.0–100.0)
Monocytes Absolute: 0.7 K/uL (ref 0.1–1.0)
Monocytes Relative: 11 % (ref 3.0–12.0)
Neutro Abs: 4.1 K/uL (ref 1.4–7.7)
Neutrophils Relative %: 63.9 % (ref 43.0–77.0)
Platelets: 314 K/uL (ref 150.0–400.0)
RBC: 4.5 Mil/uL (ref 3.87–5.11)
RDW: 13.3 % (ref 11.5–15.5)
WBC: 6.4 K/uL (ref 4.0–10.5)

## 2024-06-19 NOTE — Progress Notes (Signed)
 I agree with the assessment and plan as outlined by Ms. Steffanie Dunn.

## 2024-06-19 NOTE — Patient Instructions (Addendum)
 Your provider has requested that you go to the basement level for lab work before leaving today. Press B on the elevator. The lab is located at the first door on the left as you exit the elevator.  Please take your proton pump inhibitor medication, pantoprazole 40 mg BID  Please take this medication 30 minutes to 1 hour before meals- this makes it more effective.  Will add on pepcid 20 mg at night Will add on FDGard  Avoid spicy and acidic foods Avoid fatty foods Limit your intake of coffee, tea, alcohol, and carbonated drinks Work to maintain a healthy weight Keep the head of the bed elevated at least 3 inches with blocks or a wedge pillow if you are having any nighttime symptoms Stay upright for 2 hours after eating Avoid meals and snacks three to four hours before bedtime  Reflux Gourmet Rescue  It is an ALGINATE THERAPY which is the only intervention that works to safeguard the esophagus by creating a protective barrier that actually stops reflux from happening. -The general directions for use are as stated on the packaging: Take 1 teaspoon (5 ml), or more as needed or as directed by your physician, after meals and before bed. -These general directions address the most common times for reflux to occur, but our Rescue products may be taken anytime. Some individuals may take our product preemptively, when they know they will suffer from reflux, or as needed - when discomfort arises. (If taken around food, it should be consumed last.) -You do not have to take 1 teaspoon (5 ml) of the product. While one teaspoon (5ml) may be the perfect average amount to relieve reflux suffering in some, others may require more or less. You may adjust the amount of Mint Chocolate Rescue and Vanilla Caramel Rescue to the lowest amount necessary to meet your individual needs to improve your quality of life. -You may dilute the product if it is too viscous for you to consume. Keep in mind, however, that the  thickness of the product was formulated to provide optimal coating and protection of your throat and esophagus. Though diluting the product is possible, it may reduce the protective function and/or length of action. -This can be used in conjunction with reflux medications and lifestyle changes.  100% ALL-NATURAL  Paraben FREE, glycerin FREE, & potassium FREE  Made entirely from all-natural ingredients considered safe for children and during pregnancy  No known side effects  All-natural flavor Gluten FREE  Allergen FREE  Vegan  Can find more information here: NameSeizer.co.nz   FIBER SUPPLEMENT You can do metamucil or fibercon once or twice a day but if this causes gas/bloating please switch to Benefiber or Citracel.  Fiber is good for constipation/diarrhea/irritable bowel syndrome.  It can also help with weight loss and can help lower your bad cholesterol (LDL).  Please do 1 TBSP in the morning in water, coffee, or tea.  It can take up to a month before you can see a difference with your bowel movements.  It is cheapest from [employer], [employer], [employer].   Try the magnesium 250-400 mg at night for bowel habits  - Drink at least 64-80 ounces of water/liquid per day. - Establish a time to try to move your bowels every day.  For many people, this is after a cup of coffee or after a meal such as breakfast. - Sit all of the way back on the toilet keeping your back fairly straight and while sitting up, try to rest  the tops of your forearms on your upper thighs.   - Raising your feet with a step stool/squatty potty can be helpful to improve the angle that allows your stool to pass through the rectum. - Relax the rectum feeling it bulge toward the toilet water.  If you feel your rectum raising toward your body, you are contracting rather than relaxing. - Breathe in and slowly exhale. Belly breath by expanding your belly towards your belly button. Keep belly  expanded as you gently direct pressure down and back to the anus.  A low pitched GRRR sound can assist with increasing intra-abdominal pressure.  (Can also trying to blow on a pinwheel and make it move, this helps with the same belly breathing) - Repeat 3-4 times. If unsuccessful, contract the pelvic floor to restore normal tone and get off the toilet.  Avoid excessive straining. - To reduce excessive wiping by teaching your anus to normally contract, place hands on outer aspect of knees and resist knee movement outward.  Hold 5-10 second then place hands just inside of knees and resist inward movement of knees.  Hold 5 seconds.  Repeat a few times each way.  Go to the ER if unable to pass gas, severe AB pain, unable to hold down food, any shortness of breath of chest pain.  Due to recent changes in healthcare laws, you may see the results of your imaging and laboratory studies on MyChart before your provider has had a chance to review them.  We understand that in some cases there may be results that are confusing or concerning to you. Not all laboratory results come back in the same time frame and the provider may be waiting for multiple results in order to interpret others.  Please give us  48 hours in order for your provider to thoroughly review all the results before contacting the office for clarification of your results.

## 2024-06-24 ENCOUNTER — Encounter: Admitting: Internal Medicine

## 2024-06-29 ENCOUNTER — Ambulatory Visit (AMBULATORY_SURGERY_CENTER): Admitting: Internal Medicine

## 2024-06-29 ENCOUNTER — Telehealth

## 2024-06-29 ENCOUNTER — Encounter: Payer: Self-pay | Admitting: Internal Medicine

## 2024-06-29 VITALS — BP 135/63 | HR 63 | Temp 97.4°F | Resp 15 | Ht 64.0 in | Wt 164.0 lb

## 2024-06-29 DIAGNOSIS — K209 Esophagitis, unspecified without bleeding: Secondary | ICD-10-CM | POA: Diagnosis not present

## 2024-06-29 DIAGNOSIS — K2950 Unspecified chronic gastritis without bleeding: Secondary | ICD-10-CM | POA: Diagnosis not present

## 2024-06-29 DIAGNOSIS — K222 Esophageal obstruction: Secondary | ICD-10-CM | POA: Diagnosis not present

## 2024-06-29 DIAGNOSIS — R1310 Dysphagia, unspecified: Secondary | ICD-10-CM

## 2024-06-29 DIAGNOSIS — K315 Obstruction of duodenum: Secondary | ICD-10-CM

## 2024-06-29 DIAGNOSIS — R1013 Epigastric pain: Secondary | ICD-10-CM

## 2024-06-29 DIAGNOSIS — K219 Gastro-esophageal reflux disease without esophagitis: Secondary | ICD-10-CM

## 2024-06-29 DIAGNOSIS — K449 Diaphragmatic hernia without obstruction or gangrene: Secondary | ICD-10-CM

## 2024-06-29 MED ORDER — SODIUM CHLORIDE 0.9 % IV SOLN: 500.0000 mL | Freq: Once | INTRAVENOUS | Status: DC

## 2024-06-29 NOTE — Progress Notes (Signed)
 GASTROENTEROLOGY PROCEDURE H&P NOTE   Primary Care Physician: [name], MD    Reason for Procedure:   Dysphagia, ab discomfort, GERD, history of duodenal stricture  Plan:    EGD  Patient is appropriate for endoscopic procedure(s) in the ambulatory ([facility]) setting.  The nature of the procedure, as well as the risks, benefits, and alternatives were carefully and thoroughly reviewed with the patient. Ample time for discussion and questions allowed. The patient understood, was satisfied, and agreed to proceed.     HPI: Anna Ellis is a 73 y.o. female who presents for EGD for evaluation of dysphagia, ab discomfort, GERD, duodenal stricture.  Patient was most recently seen in the Gastroenterology Clinic on 06/19/24.  No interval change in medical history since that appointment. Please refer to that note for full details regarding GI history and clinical presentation.   EGD 06/28/22: - Non- obstructing Schatzki ring. - 2 cm hiatal hernia. - Gastritis. Biopsied. - Acquired duodenal stenosis. Biopsied. 1. Surgical [P], duodenal stricture SUPERFICIAL FRAGMENTS OF REACTIVE DUODENAL MUCOSA WITH EXTENSIVE GASTRIC METAPLASIA AND FOCAL CHANGES CONSISTENT WITH PRIOR EROSION AND COMPATIBLE WITH PEPTIC DUODENITIS  Sections of the first specimen labeled duodenal stricture show multiple superficial fragments of small bowel with Brunner's glands exhibiting blunted villi with mucin depletion and extensive surface gastric foveolar epithelium. Very focally there are stromal changes compatible with prior erosion. The scant lamina propria exhibits the normal complement of mononuclear cells. There is no increase in intraepithelial lymphocytes and the collagen table is of normal thickness. No granulomas or parasites are seen. There is no evidence of dysplasia or carcinoma 2. 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 have diverticulosis   ESOPHAGOGASTRODUODENOSCOPY  1994   INGUINAL HERNIA REPAIR Right 1971   KNEE CARTILAGE SURGERY Left 1986   w/ACL repair   LAPAROSCOPIC CHOLECYSTECTOMY  1994   LEFT HEART CATH AND CORONARY ANGIOGRAPHY N/A 07/08/2018   Procedure: LEFT HEART CATH AND CORONARY ANGIOGRAPHY;  Surgeon: Anner Alm ORN, MD;  Location: [facility] INVASIVE CV LAB;  Service: Cardiovascular;; Angiographically normal coronary arteries except ~20-25% prox & mid LAD.  Normal LVEDP.   PACEMAKER IMPLANT N/A 07/10/2018   Medtronic Azure XT DR MRI SureScan model [id number] (serial number [id number]) pacemaker by Dr Kelsie with His bundle lead   TEAR DUCT PROBING  1955   TONSILLECTOMY AND ADENOIDECTOMY  1968   TUBAL LIGATION  1982   UPPER GI ENDOSCOPY  06/28/2022   VAGINAL HYSTERECTOMY  1990   WISDOM TOOTH EXTRACTION  1967    Prior to Admission  medications   Medication Sig Start Date End Date Taking? Authorizing Provider  acetaminophen  (TYLENOL ) 500 MG tablet Take 1,000 mg by mouth every 6 (six) hours as needed (for pain.).    [provider]  ALPRAZolam  (XANAX ) 0.25 MG tablet Take 1 tablet (0.25 mg total) by mouth 2  (two) times daily as needed for sleep or anxiety. 01/09/24   Plotnikov, Karlynn GAILS, MD  aspirin  EC 81 MG tablet Take 81 mg by mouth daily.    [provider]  atorvastatin  (LIPITOR) 10 MG tablet TAKE 1 TABLET BY MOUTH DAILY 11/10/23   Plotnikov, Aleksei V, MD  celecoxib  (CELEBREX ) 200 MG capsule Take 1 capsule (200 mg total) by mouth daily as needed for moderate pain (pain score 4-6). Patient not taking: Reported on 06/19/2024 01/09/24   Plotnikov, Aleksei V, MD  cholecalciferol (VITAMIN D3) 25 MCG (1000 UNIT) tablet Take 1,000 Units by mouth daily.    [provider]  Famotidine (PEPCID AC PO) Take 1 tablet by mouth daily in the afternoon.    [provider]  ipratropium (ATROVENT ) 0.06 % nasal spray Place 2 sprays into the nose 3 (three) times daily. 01/09/24 01/08/25  Plotnikov, Karlynn GAILS, MD  losartan  (COZAAR ) 25 MG tablet TAKE 1 TABLET BY MOUTH EVERY DAY 02/24/24   Plotnikov, Aleksei V, MD  metroNIDAZOLE  (METROCREAM ) 0.75 % cream Apply 1 application topically daily as needed (for rosacea). Patient not taking: Reported on 06/19/2024 11/04/19   Plotnikov, Karlynn GAILS, MD  montelukast  (SINGULAIR ) 10 MG tablet Take 1 tablet (10 mg total) by mouth daily. 01/29/24 01/28/25  Plotnikov, Aleksei V, MD  mupirocin  ointment (BACTROBAN ) 2 % On leg wound w/dressing change qd or bid Patient not taking: Reported on 06/19/2024 01/29/24   Plotnikov, Aleksei V, MD  omeprazole  (PRILOSEC) 40 MG capsule Take 1 capsule (40 mg total) by mouth 2 (two) times daily. 05/27/24   Collier, Amanda R, PA-C  Simethicone (GAS-X PO) Take 1 tablet by mouth daily in the afternoon.    [provider]  vitamin E 1000 UNIT capsule Take 1,000 Units by mouth daily.    [provider]  SANFORD 5 % SOLN Two doses per tube - per eye DR 05/23/23   [provider]    Current Outpatient Medications  Medication Sig Dispense Refill   acetaminophen  (TYLENOL ) 500 MG tablet Take 1,000 mg by mouth every 6  (six) hours as needed (for pain.).     ALPRAZolam  (XANAX ) 0.25 MG tablet Take 1 tablet (0.25 mg total) by mouth 2 (two) times daily as needed for sleep or anxiety. 60 tablet 2   aspirin  EC 81 MG tablet Take 81 mg by mouth daily.     atorvastatin  (LIPITOR) 10 MG tablet TAKE 1 TABLET BY MOUTH DAILY 90 tablet 3   cholecalciferol (VITAMIN D3) 25 MCG (1000 UNIT) tablet Take 1,000 Units by mouth daily.     Famotidine (PEPCID AC PO) Take 1 tablet by mouth daily in the afternoon.     losartan  (COZAAR ) 25 MG tablet TAKE 1 TABLET BY MOUTH EVERY DAY 90 tablet 3   montelukast  (SINGULAIR ) 10 MG tablet Take 1 tablet (10 mg total) by mouth daily. 90 tablet 3   omeprazole  (PRILOSEC) 40 MG capsule Take 1 capsule (40 mg total) by mouth 2 (two) times daily. 180 capsule 3   Simethicone (GAS-X PO) Take 1 tablet by mouth daily in the afternoon.     vitamin E 1000 UNIT capsule Take 1,000 Units by mouth daily.  XIIDRA 5 % SOLN Two doses per tube - per eye DR     celecoxib  (CELEBREX ) 200 MG capsule Take 1 capsule (200 mg total) by mouth daily as needed for moderate pain (pain score 4-6). (Patient not taking: No sig reported) 30 capsule 1   ipratropium (ATROVENT ) 0.06 % nasal spray Place 2 sprays into the nose 3 (three) times daily. 15 mL 2   metroNIDAZOLE  (METROCREAM ) 0.75 % cream Apply 1 application topically daily as needed (for rosacea). (Patient not taking: No sig reported) 45 g 3   mupirocin  ointment (BACTROBAN ) 2 % On leg wound w/dressing change qd or bid (Patient not taking: No sig reported) 30 g 0   Current Facility-Administered Medications  Medication Dose Route Frequency Provider Last Rate Last Admin   0.9 %  sodium chloride  infusion  500 mL Intravenous Once Federico Rosario BROCKS, MD        Allergies as of 06/29/2024 - Review Complete 06/29/2024  Allergen Reaction Noted   Clarithromycin Hives 09/19/2007   Cobalt Rash 01/23/2016   Etodolac  Other (See Comments) 01/07/2023   Food color red Hives 09/19/2007    Parabens Rash 01/23/2016   Sulfonamide derivatives Hives 09/19/2007    Family History  Problem Relation Age of Onset   Breast cancer Mother        breast   Heart disease Father        h/o Rheumatic Fever - Rheumatic MV Dz.   Colon cancer Paternal Aunt 77 - 89   Tuberculosis Maternal Grandmother        died in her 62s   Throat cancer Maternal Grandfather    Pneumonia Paternal Grandmother    Allergies Son    Asthma Son    Esophageal cancer Neg Hx    Rectal cancer Neg Hx    Stomach cancer Neg Hx     Social History   Socioeconomic History   Marital status: Married    Spouse name: Not on file   Number of children: 3   Years of education: Not on file   Highest education level: 12th grade  Occupational History   Occupation: Retired    Associate Professor: RETIRED    Comment: Environmental health practitioner  Tobacco Use   Smoking status: Never   Smokeless tobacco: Never  Vaping Use   Vaping status: Never Used  Substance and Sexual Activity   Alcohol use: Never   Drug use: Never   Sexual activity: Yes  Other Topics Concern   Not on file  Social History Narrative   She is a married (49 years).  They have 3 children and 5 grandchildren.  She is a retired Horticulturist, commercial for [employer].   She regularly exercises walking and using stairs etc.   Social Drivers of Health   Financial Resource Strain: Low Risk  (03/31/2024)   Overall Financial Resource Strain (CARDIA)    Difficulty of Paying Living Expenses: Not hard at all  Food Insecurity: No Food Insecurity (03/31/2024)   Hunger Vital Sign    Worried About Running Out of Food in the Last Year: Never true    Ran Out of Food in the Last Year: Never true  Transportation Needs: No Transportation Needs (03/31/2024)   PRAPARE - Administrator, Civil Service (Medical): No    Lack of Transportation (Non-Medical): No  Physical Activity: Unknown (03/31/2024)   Exercise Vital Sign    Days of Exercise per Week: 0 days     Minutes of Exercise per Session:  Not on file  Stress: No Stress Concern Present (03/31/2024)   [employer] of Occupational Health - Occupational Stress Questionnaire    Feeling of Stress : Not at all  Social Connections: Socially Integrated (03/31/2024)   Social Connection and Isolation Panel    Frequency of Communication with Friends and Family: More than three times a week    Frequency of Social Gatherings with Friends and Family: More than three times a week    Attends Religious Services: More than 4 times per year    Active Member of [employer] or Organizations: Yes    Attends Engineer, structural: More than 4 times per year    Marital Status: Married  Catering manager Violence: Not on file    Physical Exam: Vital signs in last 24 hours: BP (!) 154/67   Pulse 68   Temp (!) 97.4 F (36.3 C)   Ht 5' 4 (1.626 m)   Wt 164 lb (74.4 kg)   SpO2 99%   BMI 28.15 kg/m  GEN: NAD EYE: Sclerae anicteric ENT: MMM CV: Non-tachycardic Pulm: No increased WOB GI: Soft NEURO:  Alert & Oriented   Estefana Kidney, MD [facility]   06/29/2024 11:16 AM

## 2024-06-29 NOTE — Progress Notes (Signed)
To PACU, VSS. Report to [email or web address] 

## 2024-06-29 NOTE — Progress Notes (Signed)
 Called to room to assist during endoscopic procedure.  Patient ID and intended procedure confirmed with present staff. Received instructions for my participation in the procedure from the performing physician.

## 2024-06-29 NOTE — Op Note (Signed)
 [facility] Patient Name: Anna Ellis Procedure Date: 06/29/2024 11:02 AM MRN: [id number] Endoscopist: Rosario Estefana Kidney , , [id number] Age: 73 Referring MD:  Date of Birth: 16-Sep-1951 Gender: Female Account #: [id number] Procedure:                Upper GI endoscopy Indications:              Epigastric abdominal pain, Dysphagia, Heartburn Medicines:                Monitored Anesthesia Care Procedure:                Pre-Anesthesia Assessment:                           - Prior to the procedure, a History and Physical                            was performed, and patient medications and                            allergies were reviewed. The patient's tolerance of                            previous anesthesia was also reviewed. The risks                            and benefits of the procedure and the sedation                            options and risks were discussed with the patient.                            All questions were answered, and informed consent                            was obtained. Prior Anticoagulants: The patient has                            taken no anticoagulant or antiplatelet agents. ASA                            Grade Assessment: III - A patient with severe                            systemic disease. After reviewing the risks and                            benefits, the patient was deemed in satisfactory                            condition to undergo the procedure.                           After obtaining informed consent, the endoscope was  passed under direct vision. Throughout the                            procedure, the patient's blood pressure, pulse, and                            oxygen saturations were monitored continuously. The                            GIF [id number] #[id number] was introduced through the                            mouth, and advanced to the second part of duodenum.                             The upper GI endoscopy was accomplished without                            difficulty. The patient tolerated the procedure                            well. Scope In: Scope Out: Findings:                 A non-obstructing Schatzki ring was found at the                            gastroesophageal junction. A guidewire was placed                            and the scope was withdrawn. Dilation was performed                            with a Savary dilator with mild resistance at 19 mm.                           Biopsies were taken with a cold forceps in the                            proximal esophagus and in the distal esophagus for                            histology.                           A 2 cm hiatal hernia was present.                           An acquired benign-appearing, intrinsic moderate                            stenosis was found in the first portion of the                            duodenum and  was traversed. Traversing the area of                            the stenosis allowed for some dilation. Complications:            No immediate complications. Estimated Blood Loss:     Estimated blood loss was minimal. Impression:               - Non-obstructing Schatzki ring. Dilated.                           - 2 cm hiatal hernia.                           - Acquired duodenal stenosis. Stretched with                            passage of endoscope.                           - Biopsies were taken with a cold forceps for                            histology in the proximal esophagus and in the                            distal esophagus. Recommendation:           - Discharge patient to home (with escort).                           - Await pathology results.                           - Continue omeprazole  for now.                           - If the sensation of food getting hung up                            persists, could consider further dilation of                             duodenal stricture in the future.                           - Return to GI clinic in 2-3 months.                           - The findings and recommendations were discussed                            with the patient. Dr Estefana Federico Rosario Estefana Federico,  06/29/2024 11:54:05 AM

## 2024-06-29 NOTE — Progress Notes (Signed)
VS by DT    

## 2024-06-29 NOTE — Patient Instructions (Addendum)
 Continue omeprazole  for now. If the sensation of food getting hung up persists, could consider further dilation of the stricture in the future. Return to the GI clinic for follow up on 9/9 @ 0930  YOU HAD AN ENDOSCOPIC PROCEDURE TODAY AT THE [facility]:   Refer to the procedure report that was given to you for any specific questions about what was found during the examination.  If the procedure report does not answer your questions, please call your gastroenterologist to clarify.  If you requested that your care partner not be given the details of your procedure findings, then the procedure report has been included in a sealed envelope for you to review at your convenience later.  YOU SHOULD EXPECT: Some feelings of bloating in the abdomen. Passage of more gas than usual.  Walking can help get rid of the air that was put into your GI tract during the procedure and reduce the bloating. If you had a lower endoscopy (such as a colonoscopy or flexible sigmoidoscopy) you may notice spotting of blood in your stool or on the toilet paper. If you underwent a bowel prep for your procedure, you may not have a normal bowel movement for a few days.  Please Note:  You might notice some irritation and congestion in your nose or some drainage.  This is from the oxygen used during your procedure.  There is no need for concern and it should clear up in a day or so.  SYMPTOMS TO REPORT IMMEDIATELY:  Following upper endoscopy (EGD)  Vomiting of blood or coffee ground material  New chest pain or pain under the shoulder blades  Painful or persistently difficult swallowing  New shortness of breath  Fever of 100F or higher  Black, tarry-looking stools  For urgent or emergent issues, a gastroenterologist can be reached at any hour by calling [phone]. Do not use MyChart messaging for urgent concerns.    DIET:  We do recommend a small meal at first, but then you may proceed to your regular diet.   Drink plenty of fluids but you should avoid alcoholic beverages for 24 hours.  ACTIVITY:  You should plan to take it easy for the rest of today and you should NOT DRIVE or use heavy machinery until tomorrow (because of the sedation medicines used during the test).    FOLLOW UP: Our staff will call the number listed on your records the next business day following your procedure.  We will call around 7:15- 8:00 am to check on you and address any questions or concerns that you may have regarding the information given to you following your procedure. If we do not reach you, we will leave a message.     If any biopsies were taken you will be contacted by phone or by letter within the next 1-3 weeks.  Please call us  at [phone] if you have not heard about the biopsies in 3 weeks.    SIGNATURES/CONFIDENTIALITY: You and/or your care partner have signed paperwork which will be entered into your electronic medical record.  These signatures attest to the fact that that the information above on your After Visit Summary has been reviewed and is understood.  Full responsibility of the confidentiality of this discharge information lies with you and/or your care-partner.

## 2024-06-30 ENCOUNTER — Telehealth: Payer: Self-pay

## 2024-06-30 NOTE — Telephone Encounter (Signed)
  Follow up Call-     06/29/2024   11:03 AM 05/01/2024    7:09 AM 06/28/2022    8:09 AM  Call back number  Post procedure Call Back phone  # [phone]  Permission to leave phone message Yes Yes Yes     Patient questions:  Do you have a fever, pain , or abdominal swelling? No. Pain Score  0 *  Have you tolerated food without any problems? Yes.    Have you been able to return to your normal activities? Yes.    Do you have any questions about your discharge instructions: Diet   No. Medications  No. Follow up visit  No.  Do you have questions or concerns about your Care? No.  Actions: * If pain score is 4 or above: No action needed, pain <4.

## 2024-07-01 ENCOUNTER — Ambulatory Visit: Payer: Self-pay | Admitting: Internal Medicine

## 2024-07-01 LAB — SURGICAL PATHOLOGY

## 2024-07-01 NOTE — Progress Notes (Signed)
 Cardiology Office Note:  .   Date:  07/02/2024  ID:  Anna Ellis, DOB Jan 21, 1951, MRN [id number] PCP: [name], MD  [facility] Providers Cardiologist:  Alm Clay, MD Electrophysiologist:  Eulas FORBES Furbish, MD {  History of Present Illness: .   Anna Ellis is a 73 y.o. 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   Last seen in heartcare by EP 12/27/2023 with Daphne Barrack, NP for follow-up.  Noted episode of heat related syncope at a ball game in June 2024, however nothing was noted on device.  Overall doing well from cardiac standpoint and denied any other cardiac symptoms.  EKG showed ventricular paced rhythm. Normal PPM function and no events noted on device interrogation.  Noted SBP initially elevated to 150s upon arrival but improved to 134/74.  No medication changes.  Today, patient is accompanied by her husband. Reports one episode of chest pain on 05/17/2024, requesting device interrogation for that day. CP described as burning, 6/10 intensity, lasting seconds, mid-sternal with radiation across the chest and into the left arm, occurring while walking. Although the pain itself lasted only seconds, she states she "felt bad all night."  She suspects the CP was GI-related given extensive GI history; however, she reports it was similar to CP in 2019. She followed up with GI earlier this week, and EGD revealed no significant changes. She denies any other exertional chest pain since then and  remains chest pain-free today. Reports excellent functional status: walked three levels of a mall yesterday and climbs stairs carrying boxes at home without symptoms. Denies SOB, palpitations, edema, syncope and dizziness. Reports compliance with medication. Denies tobacco use/alcohol/drug  use. Denies any recent hospitalizations or visits to the emergency department.   ROS: 10 point review of system has been reviewed and considered negative except ones been listed in the HPI.   Studies Reviewed: SABRA   EKG Interpretation Date/Time:  Thursday July 02 2024 15:43:06 EDT Ventricular Rate:  75 PR Interval:  256 QRS Duration:  146 QT Interval:  430 QTC Calculation: 480 R Axis:   30  Text Interpretation: Atrial-sensed ventricular-paced rhythm with prolonged AV conduction When compared with ECG of 27-Dec-2023 10:32, Vent. rate has increased BY   7 BPM Confirmed by Sheron Hallmark ([id number]) on 07/02/2024 3:51:50 PM Cath 06/2018 The left ventricular systolic function is normal. The left ventricular ejection fraction is 55-65% by visual estimate. LV end diastolic pressure is normal. Prox LAD lesion is 20% stenosed. Mid LAD lesion is 25% stenosed.   Angiographically minimal CAD in the left dominant system. Well preserved LVEF with normal EDP. Persistent 2: 1 AVB with heart rate in the 50s.   Plan: Discharge home after bedrest.  I will discuss with elective physiology and have her scheduled to see them to discuss the potential requirement for pacemaker.  Follow-up GERD   Recommend Aspirin  81mg  daily for moderate CAD.  ECHO 07/2018 Study Conclusions  - Left ventricle: The cavity size was normal. Systolic function was    normal. The estimated ejection fraction was in the range of 55%    to 60%. Wall motion was normal; there were no regional wall    motion abnormalities. Left ventricular diastolic function    parameters were normal.  - Mitral valve:  There was mild regurgitation.  - Atrial septum: No defect or patent foramen ovale was identified.   Physical Exam:   VS:  BP 134/62   Pulse 75   Ht 5' 4 (1.626 m)   Wt 162 lb (73.5 kg)   SpO2 99%   BMI 27.81 kg/m    Wt Readings from Last 3 Encounters:  07/02/24 162 lb (73.5 kg)  06/29/24 164 lb (74.4 kg)  06/19/24 164 lb (74.4 kg)     GEN: Well nourished, well developed in no acute distress while sitting in chair.  NECK: No JVD; No carotid bruits CARDIAC: RRR, no murmurs, rubs, gallops RESPIRATORY:  Clear to auscultation without rales, wheezing or rhonchi  ABDOMEN: Soft, non-tender, non-distended EXTREMITIES:  No edema; No deformity   ASSESSMENT AND PLAN: .   Preoperative Cardiovascular Risk Assessment Procedure:  LEFT TOTAL KNEE REPLACEMENT Date of Surgery:  Clearance 07/13/24                              Surgeon:  DR. MATTHEW OLIN Surgeon's Group or Practice Name:  [facility] Phone number:  [phone] Fax number:  [phone] Revised Cardiac Risk Index (RCRI) with 1 point and perioperative risk of major cardiac events is 0.9 %.  Duke Activity Status Index (DASI) with > 4 mets .  This patient is at acceptable risk for the planned procedure based on RCRI and DASI. However, patient is not cleared for surgery until further evaluation of chest pain is complete. Patient agrees to postpone surgery, with repeat pre-op  evaluation planned for 09/2024.  Coronary artery disease involving native coronary artery of native heart without angina pectoris Hyperlipidemia LDL goal <70 Chest pain  Lexiscan  2019: Medium size, moderate intensity reversible anterior perfusion defect suggestive of ischemia (visually appears worse than SDS score) in the LAD territory. LVEF 58% with normal wall motion. This is a intermediate risk study.  cath 2019: Minimal CAD with 20% proximal LAD and 25% mid LAD Echo 2019 showed EF 55 to 60% with normal structure and function. Recent episode of exertional chest pain (05/17/2024); requesting device interrogation for that date. Remote transmission scheduled for 07/16/2024. Instructed to call [employer] for manual upload. Note will be forwarded to EP for review.  EKG today without any ischemic ST changes  Suspect atypical CP but underlying ischemia cannot be ruled out given CAD history. Considered stress test  but will avoid due to previous abnormal stress test as above.  Considered coronary CTA but will avoid due to dual chamber pacemaker.  Order ECHO to assess LVEF and wall motion.  If ECHO is normal, can consider PET stress test  If ECHO is abnormal, would recommend cardiac cath.  12/2023 LDL 82; 06/2024 LFT WNL, Cr 0.89, K 3.8 Increase Lipitor from 10 to 20 mg daily.  Order NTG SL 0.4 mg PRN. Continue on Losartan  25 mg daily  Essential hypertension Home BP usually 120-130/70-80's BP this OV today, mildly elevated 134/62 then repeat 122/80 Continue Losartan  as above. Continue to monitor BP at home.   Second degree Mobitz II AV block Pacemaker Bundle of His [employer] Dual Chamber PPM implanted 07/10/18 for Second Degree AV block.   Last interrogation 04/2024 showed normal functioning and no clinically significant arrhythmias. Continue to follow with EP.     Dispo: Follow up in 09/2024 with Dr. Lesli or Mercy Hails, PA-C  Signed, Lorette CINDERELLA Kapur, PA-C

## 2024-07-02 ENCOUNTER — Encounter: Payer: Self-pay | Admitting: Physician Assistant

## 2024-07-02 ENCOUNTER — Ambulatory Visit: Attending: Physician Assistant | Admitting: Physician Assistant

## 2024-07-02 VITALS — BP 134/62 | HR 75 | Ht 64.0 in | Wt 162.0 lb

## 2024-07-02 DIAGNOSIS — I2510 Atherosclerotic heart disease of native coronary artery without angina pectoris: Secondary | ICD-10-CM | POA: Diagnosis not present

## 2024-07-02 DIAGNOSIS — R079 Chest pain, unspecified: Secondary | ICD-10-CM | POA: Diagnosis not present

## 2024-07-02 DIAGNOSIS — Z950 Presence of cardiac pacemaker: Secondary | ICD-10-CM

## 2024-07-02 DIAGNOSIS — E785 Hyperlipidemia, unspecified: Secondary | ICD-10-CM | POA: Diagnosis not present

## 2024-07-02 DIAGNOSIS — I441 Atrioventricular block, second degree: Secondary | ICD-10-CM

## 2024-07-02 DIAGNOSIS — Z01810 Encounter for preprocedural cardiovascular examination: Secondary | ICD-10-CM | POA: Diagnosis not present

## 2024-07-02 DIAGNOSIS — I10 Essential (primary) hypertension: Secondary | ICD-10-CM

## 2024-07-02 MED ORDER — ATORVASTATIN CALCIUM 20 MG PO TABS: 20.0000 mg | ORAL_TABLET | Freq: Every day | ORAL | 3 refills | Status: AC

## 2024-07-02 MED ORDER — NITROGLYCERIN 0.4 MG SL SUBL: 0.4000 mg | SUBLINGUAL_TABLET | SUBLINGUAL | 5 refills | Status: AC | PRN

## 2024-07-02 NOTE — Patient Instructions (Signed)
 Medication Instructions:  Your physician has recommended you make the following change in your medication:  INCREASE LIPITOR TO 20 MG DAILY START NITROGLYCERIN  AS NEEDED FOR CHEST PAIN.   *If you need a refill on your cardiac medications before your next appointment, please call your pharmacy*  Lab Work: NONE If you have labs (blood work) drawn today and your tests are completely normal, you will receive your results only by: MyChart Message (if you have MyChart) OR A paper copy in the mail If you have any lab test that is abnormal or we need to change your treatment, we will call you to review the results.  Testing/Procedures: Your physician has requested that you have an echocardiogram. Echocardiography is a painless test that uses sound waves to create images of your heart. It provides your doctor with information about the size and shape of your heart and how well your heart's chambers and valves are working. This procedure takes approximately one hour. There are no restrictions for this procedure. Please do NOT wear cologne, perfume, aftershave, or lotions (deodorant is allowed). Please arrive 15 minutes prior to your appointment time.  Please note: We ask at that you not bring children with you during ultrasound (echo/ vascular) testing. Due to room size and safety concerns, children are not allowed in the ultrasound rooms during exams. Our front office staff cannot provide observation of children in our lobby area while testing is being conducted. An adult accompanying a patient to their appointment will only be allowed in the ultrasound room at the discretion of the ultrasound technician under special circumstances. We apologize for any inconvenience.   Follow-Up: At [facility], you and your health needs are our priority.  As part of our continuing mission to provide you with exceptional heart care, our providers are all part of one team.  This team includes your primary  Cardiologist (physician) and Advanced Practice Providers or APPs (Physician Assistants and Nurse Practitioners) who all work together to provide you with the care you need, when you need it.  Your next appointment:   3 month(s)  Provider:   DR. HARDING OR ANGELA DUKE, PA-C  We recommend signing up for the patient portal called MyChart.  Sign up information is provided on this After Visit Summary.  MyChart is used to connect with patients for Virtual Visits (Telemedicine).  Patients are able to view lab/test results, encounter notes, upcoming appointments, etc.  Non-urgent messages can be sent to your provider as well.   To learn more about what you can do with MyChart, go to [email or web address].

## 2024-07-06 ENCOUNTER — Encounter: Payer: Self-pay | Admitting: Physician Assistant

## 2024-07-16 ENCOUNTER — Ambulatory Visit: Payer: Medicare Other

## 2024-07-16 DIAGNOSIS — I441 Atrioventricular block, second degree: Secondary | ICD-10-CM | POA: Diagnosis not present

## 2024-07-16 LAB — CUP PACEART REMOTE DEVICE CHECK
Battery Remaining Longevity: 43 mo
Battery Voltage: 2.94 V
Brady Statistic AP VP Percent: 44.08 %
Brady Statistic AP VS Percent: 0 %
Brady Statistic AS VP Percent: 55.68 %
Brady Statistic AS VS Percent: 0.24 %
Brady Statistic RA Percent Paced: 44.1 %
Brady Statistic RV Percent Paced: 99.76 %
Date Time Interrogation Session: 20250807014033
Implantable Lead Connection Status: 753985
Implantable Lead Connection Status: 753985
Implantable Lead Implant Date: 20190801
Implantable Lead Implant Date: 20190801
Implantable Lead Location: 753859
Implantable Lead Location: 753860
Implantable Lead Model: 3830
Implantable Lead Model: 5076
Implantable Pulse Generator Implant Date: 20190801
Lead Channel Impedance Value: 342 Ohm
Lead Channel Impedance Value: 380 Ohm
Lead Channel Impedance Value: 399 Ohm
Lead Channel Impedance Value: 456 Ohm
Lead Channel Pacing Threshold Amplitude: 0.75 V
Lead Channel Pacing Threshold Amplitude: 0.75 V
Lead Channel Pacing Threshold Pulse Width: 0.4 ms
Lead Channel Pacing Threshold Pulse Width: 0.4 ms
Lead Channel Sensing Intrinsic Amplitude: 5 mV
Lead Channel Sensing Intrinsic Amplitude: 5 mV
Lead Channel Sensing Intrinsic Amplitude: 7.75 mV
Lead Channel Sensing Intrinsic Amplitude: 7.75 mV
Lead Channel Setting Pacing Amplitude: 2 V
Lead Channel Setting Pacing Amplitude: 2.5 V
Lead Channel Setting Pacing Pulse Width: 0.4 ms
Lead Channel Setting Sensing Sensitivity: 1.2 mV
Zone Setting Status: 755011
Zone Setting Status: 755011

## 2024-07-17 ENCOUNTER — Ambulatory Visit: Admitting: Internal Medicine

## 2024-07-21 ENCOUNTER — Ambulatory Visit: Payer: Self-pay | Admitting: Cardiovascular Disease

## 2024-08-04 ENCOUNTER — Ambulatory Visit: Admitting: Internal Medicine

## 2024-08-04 ENCOUNTER — Encounter: Payer: Self-pay | Admitting: Internal Medicine

## 2024-08-04 VITALS — BP 158/88 | HR 69 | Temp 97.6°F | Ht 64.0 in | Wt 159.0 lb

## 2024-08-04 DIAGNOSIS — R590 Localized enlarged lymph nodes: Secondary | ICD-10-CM | POA: Diagnosis not present

## 2024-08-04 DIAGNOSIS — R070 Pain in throat: Secondary | ICD-10-CM | POA: Diagnosis not present

## 2024-08-04 DIAGNOSIS — L509 Urticaria, unspecified: Secondary | ICD-10-CM | POA: Diagnosis not present

## 2024-08-04 DIAGNOSIS — M542 Cervicalgia: Secondary | ICD-10-CM | POA: Diagnosis not present

## 2024-08-04 DIAGNOSIS — R0989 Other specified symptoms and signs involving the circulatory and respiratory systems: Secondary | ICD-10-CM | POA: Diagnosis not present

## 2024-08-04 LAB — CBC WITH DIFFERENTIAL/PLATELET
Basophils Absolute: 0 K/uL (ref 0.0–0.1)
Basophils Relative: 0.7 % (ref 0.0–3.0)
Eosinophils Absolute: 0.2 K/uL (ref 0.0–0.7)
Eosinophils Relative: 2.4 % (ref 0.0–5.0)
HCT: 45.8 % (ref 36.0–46.0)
Hemoglobin: 15.1 g/dL — ABNORMAL HIGH (ref 12.0–15.0)
Lymphocytes Relative: 34.6 % (ref 12.0–46.0)
Lymphs Abs: 2.3 K/uL (ref 0.7–4.0)
MCHC: 33.1 g/dL (ref 30.0–36.0)
MCV: 95 fl (ref 78.0–100.0)
Monocytes Absolute: 0.8 K/uL (ref 0.1–1.0)
Monocytes Relative: 11.9 % (ref 3.0–12.0)
Neutro Abs: 3.3 K/uL (ref 1.4–7.7)
Neutrophils Relative %: 50.4 % (ref 43.0–77.0)
Platelets: 411 K/uL — ABNORMAL HIGH (ref 150.0–400.0)
RBC: 4.81 Mil/uL (ref 3.87–5.11)
RDW: 14 % (ref 11.5–15.5)
WBC: 6.5 K/uL (ref 4.0–10.5)

## 2024-08-04 LAB — COMPREHENSIVE METABOLIC PANEL WITH GFR
ALT: 19 U/L (ref 0–35)
AST: 18 U/L (ref 0–37)
Albumin: 4.5 g/dL (ref 3.5–5.2)
Alkaline Phosphatase: 59 U/L (ref 39–117)
BUN: 17 mg/dL (ref 6–23)
CO2: 29 meq/L (ref 19–32)
Calcium: 9.4 mg/dL (ref 8.4–10.5)
Chloride: 102 meq/L (ref 96–112)
Creatinine, Ser: 0.74 mg/dL (ref 0.40–1.20)
GFR: 79.31 mL/min (ref >60.00)
Glucose, Bld: 93 mg/dL (ref 70–99)
Potassium: 4.3 meq/L (ref 3.5–5.1)
Sodium: 142 meq/L (ref 135–145)
Total Bilirubin: 0.5 mg/dL (ref 0.2–1.2)
Total Protein: 7.2 g/dL (ref 6.0–8.3)

## 2024-08-04 LAB — SEDIMENTATION RATE: Sed Rate: 11 mm/h (ref 0–30)

## 2024-08-04 LAB — C-REACTIVE PROTEIN: CRP: <1 mg/dL (ref 0.5–20.0)

## 2024-08-04 NOTE — Assessment & Plan Note (Addendum)
 CT neck Carotid doppler US  ENT ref - Dr Roark if problems Obtain CBC and other labs

## 2024-08-04 NOTE — Progress Notes (Signed)
 Subjective:  Patient ID: Anna Ellis, female    DOB: [birth year]  Age: 73 y.o. MRN: [id number]  CC: Sore Throat (Pt states she has a sore throat and issues with her glands, Discuss meds)   HPI Anna Ellis presents for ST C/o severe GERD flare up after colonoscopy 04/2024 EGD  - 06/29/24 Then pt had URI - steroids, abx, prednisone  Finishing Ceftin  PO C/o pain in the L side of the neck   Outpatient Medications Prior to Visit  Medication Sig Dispense Refill   acetaminophen  (TYLENOL ) 500 MG tablet Take 1,000 mg by mouth every 6 (six) hours as needed (for pain.).     ALPRAZolam  (XANAX ) 0.25 MG tablet Take 1 tablet (0.25 mg total) by mouth 2 (two) times daily as needed for sleep or anxiety. 60 tablet 2   aspirin  EC 81 MG tablet Take 81 mg by mouth daily.     atorvastatin  (LIPITOR) 20 MG tablet Take 1 tablet (20 mg total) by mouth daily. 90 tablet 3   cholecalciferol (VITAMIN D3) 25 MCG (1000 UNIT) tablet Take 1,000 Units by mouth daily.     Famotidine (PEPCID AC PO) Take 1 tablet by mouth daily in the afternoon.     ipratropium (ATROVENT ) 0.06 % nasal spray Place 2 sprays into the nose 3 (three) times daily. 15 mL 2   losartan  (COZAAR ) 25 MG tablet Take 25 mg by mouth 2 (two) times daily.     montelukast  (SINGULAIR ) 10 MG tablet Take 1 tablet (10 mg total) by mouth daily. 90 tablet 3   nitroGLYCERIN  (NITROSTAT ) 0.4 MG SL tablet Place 1 tablet (0.4 mg total) under the tongue every 5 (five) minutes as needed for chest pain. 25 tablet 5   omeprazole  (PRILOSEC) 40 MG capsule Take 1 capsule (40 mg total) by mouth 2 (two) times daily. 180 capsule 3   Simethicone (GAS-X PO) Take 1 tablet by mouth daily in the afternoon.     vitamin E 1000 UNIT capsule Take 1,000 Units by mouth daily.     XIIDRA 5 % SOLN Two doses per tube - per eye DR     metroNIDAZOLE  (METROCREAM ) 0.75 % cream Apply 1 application topically daily as needed (for rosacea). (Patient not taking: Reported on 08/04/2024) 45 g 3    celecoxib  (CELEBREX ) 200 MG capsule Take 1 capsule (200 mg total) by mouth daily as needed for moderate pain (pain score 4-6). (Patient not taking: Reported on 07/02/2024) 30 capsule 1   mupirocin  ointment (BACTROBAN ) 2 % On leg wound w/dressing change qd or bid (Patient not taking: No sig reported) 30 g 0   No facility-administered medications prior to visit.    ROS: Review of Systems  Constitutional:  Negative for activity change, appetite change, chills, fatigue and unexpected weight change.  HENT:  Positive for sore throat. Negative for congestion, mouth sores, rhinorrhea, sinus pressure and sinus pain.   Eyes:  Negative for visual disturbance.  Respiratory:  Negative for cough and chest tightness.   Gastrointestinal:  Negative for abdominal pain and nausea.  Genitourinary:  Negative for difficulty urinating, frequency and vaginal pain.  Musculoskeletal:  Positive for neck pain. Negative for back pain and gait problem.  Skin:  Negative for pallor and rash.  Neurological:  Negative for dizziness, tremors, weakness, numbness and headaches.  Psychiatric/Behavioral:  Negative for confusion and sleep disturbance.     Objective:  BP (!) 158/88   Pulse 69   Temp 97.6 F (36.4 C) (Oral)  Ht 5' 4 (1.626 m)   Wt 159 lb (72.1 kg)   SpO2 98%   BMI 27.29 kg/m   BP Readings from Last 3 Encounters:  08/04/24 (!) 158/88  07/02/24 134/62  06/29/24 135/63    Wt Readings from Last 3 Encounters:  08/04/24 159 lb (72.1 kg)  07/02/24 162 lb (73.5 kg)  06/29/24 164 lb (74.4 kg)    Physical Exam Constitutional:      General: She is not in acute distress.    Appearance: She is well-developed.  HENT:     Head: Normocephalic.     Right Ear: External ear normal.     Left Ear: External ear normal.     Nose: Nose normal.  Eyes:     General:        Right eye: No discharge.        Left eye: No discharge.     Conjunctiva/sclera: Conjunctivae normal.     Pupils: Pupils are equal, round,  and reactive to light.  Neck:     Thyroid : No thyromegaly.     Vascular: No JVD.     Trachea: No tracheal deviation.  Cardiovascular:     Rate and Rhythm: Normal rate and regular rhythm.     Heart sounds: Normal heart sounds.  Pulmonary:     Effort: No respiratory distress.     Breath sounds: No stridor. No wheezing.  Abdominal:     General: Bowel sounds are normal. There is no distension.     Palpations: Abdomen is soft. There is no mass.     Tenderness: There is no abdominal tenderness. There is no guarding or rebound.  Musculoskeletal:        General: No tenderness.     Cervical back: Normal range of motion and neck supple. No rigidity.  Lymphadenopathy:     Cervical: No cervical adenopathy.  Skin:    Findings: No erythema or rash.  Neurological:     Cranial Nerves: No cranial nerve deficit.     Motor: No abnormal muscle tone.     Coordination: Coordination normal.     Deep Tendon Reflexes: Reflexes normal.  Psychiatric:        Behavior: Behavior normal.        Thought Content: Thought content normal.        Judgment: Judgment normal.     Lab Results  Component Value Date   WBC 6.5 08/04/2024   HGB 15.1 (H) 08/04/2024   HCT 45.8 08/04/2024   PLT 411.0 (H) 08/04/2024   GLUCOSE 93 08/04/2024   CHOL 162 01/09/2024   TRIG 53.0 01/09/2024   HDL 69.50 01/09/2024   LDLCALC 82 01/09/2024   ALT 19 08/04/2024   AST 18 08/04/2024   NA 142 08/04/2024   K 4.3 08/04/2024   CL 102 08/04/2024   CREATININE 0.74 08/04/2024   BUN 17 08/04/2024   CO2 29 08/04/2024   TSH 1.62 04/24/2024    US  ABDOMEN LIMITED RUQ (LIVER/GB) Result Date: 06/05/2024 CLINICAL DATA:  Elevated liver enzymes. EXAM: ULTRASOUND ABDOMEN LIMITED RIGHT UPPER QUADRANT COMPARISON:  CT abdomen and pelvis April 01, 2024 FINDINGS: Gallbladder: Surgically absent. Common bile duct: Diameter: 2.2 mm. Liver: No focal lesion identified. Within normal limits in parenchymal echogenicity. Portal vein is patent on color  Doppler imaging with normal direction of blood flow towards the liver. Other: None. IMPRESSION: No acute abnormality identified. Electronically Signed   By: Craig Farr M.D.   On: 06/05/2024 08:19  Assessment & Plan:   Problem List Items Addressed This Visit     Bilateral carotid bruits   Obtain carotid Doppler ultrasound      Relevant Orders   US  Carotid Bilateral (Completed)   Cervical adenopathy   Possible.  Due to persistent neck pain/sore throat will obtain neck CT scan with contrast      Relevant Orders   CT Soft Tissue Neck W Contrast (Completed)   Neck pain   Unclear etiology.  Due to persistent neck pain/sore throat will obtain neck CT scan with contrast      Relevant Orders   CBC with Differential/Platelet (Completed)   Sedimentation rate (Completed)   C-reactive protein (Completed)   Comprehensive metabolic panel with GFR (Completed)   Throat pain in adult - Primary   CT neck Carotid doppler US  ENT ref - Dr Roark if problems Obtain CBC and other labs       Relevant Orders   CBC with Differential/Platelet (Completed)   Sedimentation rate (Completed)   C-reactive protein (Completed)   Comprehensive metabolic panel with GFR (Completed)   US  Carotid Bilateral (Completed)   CT Soft Tissue Neck W Contrast (Completed)   URTICARIA   Multiple allergies were acknowledged         No orders of the defined types were placed in this encounter.     Follow-up: Return in about 2 weeks (around 08/18/2024) for a follow-up visit.  Marolyn Noel, MD

## 2024-08-05 ENCOUNTER — Encounter: Payer: Self-pay | Admitting: Internal Medicine

## 2024-08-05 ENCOUNTER — Ambulatory Visit
Admission: RE | Admit: 2024-08-05 | Discharge: 2024-08-05 | Disposition: A | Discharge status: Home / Self Care | Source: Ambulatory Visit | Attending: Internal Medicine | Admitting: Internal Medicine

## 2024-08-05 ENCOUNTER — Ambulatory Visit: Payer: Self-pay | Admitting: Internal Medicine

## 2024-08-05 DIAGNOSIS — R590 Localized enlarged lymph nodes: Secondary | ICD-10-CM

## 2024-08-05 DIAGNOSIS — R070 Pain in throat: Secondary | ICD-10-CM

## 2024-08-05 MED ORDER — IOPAMIDOL (ISOVUE-300) INJECTION 61%
100.0000 mL | Freq: Once | INTRAVENOUS | Status: AC | PRN
  Administered 2024-08-05: 100 mL via INTRAVENOUS

## 2024-08-06 ENCOUNTER — Inpatient Hospital Stay
Admission: RE | Admit: 2024-08-06 | Discharge: 2024-08-06 | Discharge status: Home / Self Care | Source: Ambulatory Visit | Attending: Internal Medicine | Admitting: Internal Medicine

## 2024-08-06 DIAGNOSIS — R0989 Other specified symptoms and signs involving the circulatory and respiratory systems: Secondary | ICD-10-CM

## 2024-08-06 DIAGNOSIS — R070 Pain in throat: Secondary | ICD-10-CM

## 2024-08-07 ENCOUNTER — Ambulatory Visit (HOSPITAL_COMMUNITY)
Admission: RE | Admit: 2024-08-07 | Discharge: 2024-08-07 | Disposition: A | Discharge status: Home / Self Care | Source: Ambulatory Visit | Attending: Physician Assistant | Admitting: Physician Assistant

## 2024-08-07 DIAGNOSIS — R079 Chest pain, unspecified: Secondary | ICD-10-CM | POA: Insufficient documentation

## 2024-08-07 LAB — ECHOCARDIOGRAM COMPLETE
AV Mean grad: 3 mmHg
AV Peak grad: 6.3 mmHg
Ao pk vel: 1.25 m/s
MV M vel: 5.6 m/s
MV Peak grad: 125.4 mmHg
S' Lateral: 2.7 cm

## 2024-08-09 NOTE — Assessment & Plan Note (Signed)
 Obtain carotid Doppler ultrasound

## 2024-08-09 NOTE — Assessment & Plan Note (Signed)
 Multiple allergies were acknowledged

## 2024-08-09 NOTE — Assessment & Plan Note (Signed)
 Possible.  Due to persistent neck pain/sore throat will obtain neck CT scan with contrast

## 2024-08-09 NOTE — Assessment & Plan Note (Addendum)
 Unclear etiology.  Due to persistent neck pain/sore throat will obtain neck CT scan with contrast

## 2024-08-11 ENCOUNTER — Other Ambulatory Visit (HOSPITAL_COMMUNITY)

## 2024-08-12 ENCOUNTER — Ambulatory Visit: Payer: Self-pay | Admitting: Physician Assistant

## 2024-08-12 DIAGNOSIS — R0789 Other chest pain: Secondary | ICD-10-CM

## 2024-08-14 NOTE — Addendum Note (Signed)
 Addended by: Ambry Dix C on: 08/14/2024 01:14 PM   Modules accepted: Orders

## 2024-08-18 ENCOUNTER — Ambulatory Visit: Admitting: Internal Medicine

## 2024-08-18 ENCOUNTER — Encounter: Payer: Self-pay | Admitting: Internal Medicine

## 2024-08-18 VITALS — BP 126/84 | HR 74 | Temp 98.2°F | Ht 64.0 in | Wt 158.4 lb

## 2024-08-18 VITALS — BP 130/80 | HR 55 | Ht 64.0 in | Wt 157.8 lb

## 2024-08-18 DIAGNOSIS — R1032 Left lower quadrant pain: Secondary | ICD-10-CM

## 2024-08-18 DIAGNOSIS — K315 Obstruction of duodenum: Secondary | ICD-10-CM

## 2024-08-18 DIAGNOSIS — K219 Gastro-esophageal reflux disease without esophagitis: Secondary | ICD-10-CM

## 2024-08-18 DIAGNOSIS — K529 Noninfective gastroenteritis and colitis, unspecified: Secondary | ICD-10-CM

## 2024-08-18 DIAGNOSIS — R1310 Dysphagia, unspecified: Secondary | ICD-10-CM

## 2024-08-18 DIAGNOSIS — D751 Secondary polycythemia: Secondary | ICD-10-CM

## 2024-08-18 DIAGNOSIS — K9189 Other postprocedural complications and disorders of digestive system: Secondary | ICD-10-CM

## 2024-08-18 DIAGNOSIS — R1084 Generalized abdominal pain: Secondary | ICD-10-CM

## 2024-08-18 DIAGNOSIS — R140 Abdominal distension (gaseous): Secondary | ICD-10-CM

## 2024-08-18 DIAGNOSIS — I6523 Occlusion and stenosis of bilateral carotid arteries: Secondary | ICD-10-CM

## 2024-08-18 DIAGNOSIS — M791 Myalgia, unspecified site: Secondary | ICD-10-CM | POA: Diagnosis not present

## 2024-08-18 DIAGNOSIS — I6529 Occlusion and stenosis of unspecified carotid artery: Secondary | ICD-10-CM | POA: Insufficient documentation

## 2024-08-18 MED ORDER — DICYCLOMINE HCL 10 MG PO CAPS: 10.0000 mg | ORAL_CAPSULE | Freq: Three times a day (TID) | ORAL | 3 refills | Status: AC | PRN

## 2024-08-18 MED ORDER — OMEPRAZOLE 40 MG PO CPDR: 40.0000 mg | DELAYED_RELEASE_CAPSULE | Freq: Every day | ORAL | 6 refills | Status: AC

## 2024-08-18 NOTE — Progress Notes (Signed)
 08/18/2024 Anna Ellis [id number] [birth year]  Referring provider: [name], MD  ASSESSMENT AND PLAN:  GERD with history of duodenal stricture Dysphagia  05/04/2022 RUQ US  no biliary ductal lesion or liver mass limited by gas 04/01/2024 CTAB WC left lower quadrant pain diverticulosis without diverticulitis 06/28/2022 EGD 2 cm hiatal hernia gastritis, acquired duodenal stenosis biopsied H. pylori negative, extensive gastric metaplasia and focal changes of duodenum consistent with peptic duodenitis active gastritis negative H. pylori no metaplasia, NSAIDS were stopped EGD 06/29/24: - Non- obstructing Schatzki ring. Dilated. - 2 cm hiatal hernia. - Acquired duodenal stenosis. Stretched with passage of endoscope. - Biopsies were taken with a cold forceps for histology in the proximal esophagus and in the distal esophagus. Appears to be significantly improved on PPI BID. Will attempt to decrease her to every day to see if she is able to tolerate this. She is also taking H2 blocker at bedtime and alginate therapy PRN  - Drop down to omeprazole  40 mg every day - Cont famotidine 40 mg QHS - Cont alginate therapy PRN - Continue gas X PRN - Can consider SIBO testing in the future - RTC 6 months  LLQ pain improved after colonoscopy but then she was burning esophageal pain, bloating, general AB pain 09/12/2017 colonoscopy diverticulosis sigmoid colon internal hemorrhoids scarring from prior banding repeat 10 years 04/01/2024 CTAB WC left lower quadrant pain diverticulosis without diverticulitis 05/01/2024 colonoscopy normal TI, diverticulosis, 2 polyps (1 TA 1 benign) 3 to 5 mm transverse nonbleeding internal hemorrhoids, pathology shows no microscopic colitis or active inflammation 04/24/2024 KUB small to moderate volume of stool Likely constipation, IBS, pelvic floor versus musculoskeletal. Stools are more regular and ab pain does seem improved overall. Will give him some Bentyl  to see  if this helps with ab pain (did warn her about constipation as a potential side effect - Cont magnesium supplements - Can consider starting Benefiber to help with bowel regularity - Bentyl  10 mg TID PRN for ab pain  Patient Care Team: Plotnikov, Karlynn GAILS, MD as PCP - General Mealor, Eulas BRAVO, MD as PCP - Electrophysiology (Cardiology) Anner Alm ORN, MD as PCP - Cardiology (Cardiology) Brien Belvie BRAVO, MD (Pulmonary Disease) Eliza Sieving, MD as Attending Physician (Urology) Estelle Service, MD (Obstetrics and Gynecology) Federico Rosario BROCKS, MD as Consulting Physician (Gastroenterology)  HISTORY OF PRESENT ILLNESS: 73 y.o. female with a past medical history with history of GERD, osteoarthritis presents for evaluation of GERD, dysphagia, LLQ ab pain  Interval History: After two days after her recent EGD, she developed bronchitis. This has since resolved. She did feel that the esophageal dilation helped with her swallowing. She is on omeprazole  40 mg BID, which has been helpful for keeping her acid reflux under control. Every once in a while she will still feel the LLQ ab pain, usually about once per week, but this is much improved compared to be prior. She has been having regular BMs. She has been taking magnesium to help. Not taking fiber.  She  reports that she has never smoked. She has never used smokeless tobacco. She reports that she does not drink alcohol and does not use drugs.  RELEVANT GI HISTORY, IMAGING AND LABS:  CT A/P w/contrast 04/01/24: IMPRESSION: 1. No acute abnormality in the abdomen or pelvis. 2. Nonobstructing left renal calculus.  No hydronephrosis. 3. Sigmoid diverticulosis.  No acute diverticulitis.  KUB 04/24/24: IMPRESSION: 1. Normal bowel gas pattern. Small to moderate volume of stool in the  colon. 2. Retained barium within sigmoid colonic diverticula and appendix.  RUQ U/S 06/04/24: IMPRESSION: No acute abnormality identified.  EGD 06/29/24: -  Non- obstructing Schatzki ring. Dilated. - 2 cm hiatal hernia. - Acquired duodenal stenosis. Stretched with passage of endoscope. - Biopsies were taken with a cold forceps for histology in the proximal esophagus and in the distal esophagus. Path:      1. Surgical [P], distal esophagus :       - SQUAMOUS AND GASTRIC CARDIO-OXYNTIC MUCOSA WITH CHRONIC INFLAMMATION.       2. Surgical [P], proximal esophagus :       - BENIGN SQUAMOUS EPITHELIUM WITH NO SPECIFIC PATHOLOGIC CHANGE.   Colonoscopy 05/01/24: - The examined portion of the ileum was normal. - Diverticulosis in the sigmoid colon and in the ascending colon. - Two 3 to 5 mm polyps in the transverse colon, removed with a cold snare. Resected and retrieved. - Non- bleeding internal hemorrhoids. - Biopsies were taken with a cold forceps from the entire colon for evaluation of microscopic colitis. Path:      1. Surgical [P], random colon biopsy :       COLONIC MUCOSA WITH NO SIGNIFICANT PATHOLOGIC CHANGES.       NO MICROSCOPIC COLITIS, ACTIVE INFLAMMATION OR GRANULOMAS.       2. 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       POLYPOID COLONIC MUCOSA (1).   CBC    Component Value Date/Time   WBC 6.5 08/04/2024 1614   RBC 4.81 08/04/2024 1614   HGB 15.1 (H) 08/04/2024 1614   HCT 45.8 08/04/2024 1614   PLT 411.0 (H) 08/04/2024 1614   MCV 95.0 08/04/2024 1614   MCHC 33.1 08/04/2024 1614   RDW 14.0 08/04/2024 1614   LYMPHSABS 2.3 08/04/2024 1614   MONOABS 0.8 08/04/2024 1614   EOSABS 0.2 08/04/2024 1614   BASOSABS 0.0 08/04/2024 1614   Recent Labs    01/09/24 0947 04/24/24 1031 06/19/24 1507 08/04/24 1614  HGB 14.6 15.2* 14.4 15.1*    CMP     Component Value Date/Time   NA 142 08/04/2024 1614   K 4.3 08/04/2024 1614   CL 102 08/04/2024 1614   CO2 29 08/04/2024 1614   GLUCOSE 93 08/04/2024 1614   GLUCOSE 90 10/15/2006 0751   BUN 17 08/04/2024 1614   CREATININE 0.74  08/04/2024 1614   CALCIUM  9.4 08/04/2024 1614   PROT 7.2 08/04/2024 1614   ALBUMIN 4.5 08/04/2024 1614   AST 18 08/04/2024 1614   ALT 19 08/04/2024 1614   ALKPHOS 59 08/04/2024 1614   BILITOT 0.5 08/04/2024 1614   GFRNONAA >60 07/11/2018 0346   GFRAA >60 07/11/2018 0346      Latest Ref Rng & Units 08/04/2024    4:14 PM 06/19/2024    3:07 PM 04/24/2024   10:31 AM  Hepatic Function  Total Protein 6.0 - 8.3 g/dL 7.2  6.6  7.2   Albumin 3.5 - 5.2 g/dL 4.5  4.4  4.7   AST 0 - 37 U/L 18  19  16    ALT 0 - 35 U/L 19  16  16    Alk Phosphatase 39 - 117 U/L 59  53  55   Total Bilirubin 0.2 - 1.2 mg/dL 0.5  0.5  0.6       Current Medications:    Current Outpatient Medications (Cardiovascular):    atorvastatin  (LIPITOR) 20  MG tablet, Take 1 tablet (20 mg total) by mouth daily.   losartan  (COZAAR ) 25 MG tablet, Take 25 mg by mouth 2 (two) times daily.   nitroGLYCERIN  (NITROSTAT ) 0.4 MG SL tablet, Place 1 tablet (0.4 mg total) under the tongue every 5 (five) minutes as needed for chest pain.  Current Outpatient Medications (Respiratory):    ipratropium (ATROVENT ) 0.06 % nasal spray, Place 2 sprays into the nose 3 (three) times daily.   montelukast  (SINGULAIR ) 10 MG tablet, Take 1 tablet (10 mg total) by mouth daily.  Current Outpatient Medications (Analgesics):    acetaminophen  (TYLENOL ) 500 MG tablet, Take 1,000 mg by mouth every 6 (six) hours as needed (for pain.).   aspirin  EC 81 MG tablet, Take 81 mg by mouth daily.   Current Outpatient Medications (Other):    ALPRAZolam  (XANAX ) 0.25 MG tablet, Take 1 tablet (0.25 mg total) by mouth 2 (two) times daily as needed for sleep or anxiety.   cholecalciferol (VITAMIN D3) 25 MCG (1000 UNIT) tablet, Take 1,000 Units by mouth daily.   Famotidine (PEPCID AC PO), Take 1 tablet by mouth daily in the afternoon.   metroNIDAZOLE  (METROCREAM ) 0.75 % cream, Apply 1 application topically daily as needed (for rosacea).   omeprazole  (PRILOSEC) 40 MG  capsule, Take 1 capsule (40 mg total) by mouth 2 (two) times daily.   Simethicone (GAS-X PO), Take 1 tablet by mouth daily in the afternoon.   vitamin E 1000 UNIT capsule, Take 1,000 Units by mouth daily.   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 - Dr Kelsie   OSTEOARTHRITIS 09/19/2007   Pneumonia    Allergies:  Allergies  Allergen Reactions   Clarithromycin Hives   Cobalt Rash    Found on Allergy Test   Etodolac  Other (See Comments)    Gastritis    Food Color Red Hives   Parabens Rash   Sulfonamide Derivatives Hives     Surgical History:  She  has a past surgical history that includes Anterior cruciate ligament repair (Left, 2001); Wisdom tooth extraction (1967); LEFT HEART CATH AND CORONARY ANGIOGRAPHY (N/A, 07/08/2018); Tear duct probing (1955); Tonsillectomy and adenoidectomy (1968); Laparoscopic cholecystectomy (1994); Inguinal hernia repair (Right, 1971); Knee cartilage surgery (Left, 1986); Breast lumpectomy (Bilateral, 2002); Vaginal hysterectomy (1990); Tubal ligation (1982); PACEMAKER IMPLANT (N/A, 07/10/2018); Colonoscopy (09/12/2017); Esophagogastroduodenoscopy (1994); and Upper gi endoscopy (06/28/2022). Family History:  Her family history includes Allergies in her son; Asthma in her son; Breast cancer in her mother; Colon cancer (age of onset: 102 - 62) in her paternal aunt; Heart disease in her father; Pneumonia in her paternal grandmother; Throat cancer in her maternal grandfather; Tuberculosis in  her maternal grandmother.  PHYSICAL EXAM: BP 130/80   Pulse (!) 55   Ht 5' 4 (1.626 m)   Wt 157 lb 12.8 oz (71.6 kg)   SpO2 100%   BMI 27.09 kg/m  Physical Exam  General: Well appearing Resp: No increased WOB CV: Slightly bradycardic Abd: Non-tender, non-distended  Rosario JAYSON Kidney, MD 9:29 AM  I spent 35 minutes of time, including in depth chart review, independent review of results as outlined above, communicating results with the patient directly, face-to-face time with the patient, coordinating care, and ordering studies and medications as appropriate, and documentation.

## 2024-08-18 NOTE — Assessment & Plan Note (Addendum)
 Degree of carotid narrowing less than 50% bilaterally by ultrasound Criteria - 2025. New myalgias - hands, feet: Hold Atorvastatin  for 1-2 weeks to see if better

## 2024-08-18 NOTE — Patient Instructions (Addendum)
 _______________________________________________________  If your blood pressure at your visit was 140/90 or greater, please contact your primary care physician to follow up on this.  _______________________________________________________  If you are age 75 or older, your body mass index should be between 23-30. Your Body mass index is 27.09 kg/m. If this is out of the aforementioned range listed, please consider follow up with your Primary Care Provider.  If you are age 31 or younger, your body mass index should be between 19-25. Your Body mass index is 27.09 kg/m. If this is out of the aformentioned range listed, please consider follow up with your Primary Care Provider.   ________________________________________________________  The [facility] providers would like to encourage you to use MYCHART to communicate with providers for non-urgent requests or questions.  Due to long hold times on the telephone, sending your provider a message by [facility] may be a faster and more efficient way to get a response.  Please allow 48 business hours for a response.  Please remember that this is for non-urgent requests.  _______________________________________________________  [facility] is using a team-based approach to care.  Your team is made up of your doctor and two to three APPS. Our APPS (Nurse Practitioners and Physician Assistants) work with your physician to ensure care continuity for you. They are fully qualified to address your health concerns and develop a treatment plan. They communicate directly with your gastroenterologist to care for you. Seeing the Advanced Practice Practitioners on your physician's team can help you by facilitating care more promptly, often allowing for earlier appointments, access to diagnostic testing, procedures, and other specialty referrals.   We have sent the following medications to your pharmacy for you to pick up at your convenience:  START: Bentyl   10mg  one capsule three times daily as needed for abdominal pain.  REDUCE: omeprazole  40mg  one capsule daily.  Thank you for entrusting me with your care and choosing [facility].  Dr Federico

## 2024-08-18 NOTE — Progress Notes (Signed)
 Subjective:  Patient ID: STEPHAIE DARDIS, female    DOB: 09/10/1951  Age: 73 y.o. MRN: [id number]  CC: Follow-up (Just here to follow up on what was talked about two weeks ago. )   HPI LEGACY CARRENDER presents for neck and throat pain - better C/o pain in muscles and ligaments in hands, feet  Outpatient Medications Prior to Visit  Medication Sig Dispense Refill   acetaminophen  (TYLENOL ) 500 MG tablet Take 1,000 mg by mouth every 6 (six) hours as needed (for pain.).     ALPRAZolam  (XANAX ) 0.25 MG tablet Take 1 tablet (0.25 mg total) by mouth 2 (two) times daily as needed for sleep or anxiety. 60 tablet 2   aspirin  EC 81 MG tablet Take 81 mg by mouth daily.     atorvastatin  (LIPITOR) 20 MG tablet Take 1 tablet (20 mg total) by mouth daily. 90 tablet 3   cholecalciferol (VITAMIN D3) 25 MCG (1000 UNIT) tablet Take 1,000 Units by mouth daily.     dicyclomine  (BENTYL ) 10 MG capsule Take 1 capsule (10 mg total) by mouth 3 (three) times daily as needed for spasms. 90 capsule 3   Famotidine (PEPCID AC PO) Take 1 tablet by mouth daily in the afternoon.     ipratropium (ATROVENT ) 0.06 % nasal spray Place 2 sprays into the nose 3 (three) times daily. 15 mL 2   losartan  (COZAAR ) 25 MG tablet Take 25 mg by mouth 2 (two) times daily.     metroNIDAZOLE  (METROCREAM ) 0.75 % cream Apply 1 application topically daily as needed (for rosacea). 45 g 3   montelukast  (SINGULAIR ) 10 MG tablet Take 1 tablet (10 mg total) by mouth daily. 90 tablet 3   omeprazole  (PRILOSEC) 40 MG capsule Take 1 capsule (40 mg total) by mouth daily. 30 capsule 6   Simethicone (GAS-X PO) Take 1 tablet by mouth daily in the afternoon.     vitamin E 1000 UNIT capsule Take 1,000 Units by mouth daily.     XIIDRA 5 % SOLN Two doses per tube - per eye DR     nitroGLYCERIN  (NITROSTAT ) 0.4 MG SL tablet Place 1 tablet (0.4 mg total) under the tongue every 5 (five) minutes as needed for chest pain. (Patient not taking: Reported on 08/18/2024) 25  tablet 5   No facility-administered medications prior to visit.    ROS: Review of Systems  Constitutional:  Negative for activity change, appetite change, chills, fatigue and unexpected weight change.  HENT:  Negative for congestion, mouth sores and sinus pressure.   Eyes:  Negative for visual disturbance.  Respiratory:  Negative for cough and chest tightness.   Gastrointestinal:  Negative for abdominal pain and nausea.  Genitourinary:  Negative for difficulty urinating, frequency and vaginal pain.  Musculoskeletal:  Positive for arthralgias and myalgias. Negative for back pain and gait problem.  Skin:  Negative for pallor and rash.  Neurological:  Negative for dizziness, tremors, weakness, numbness and headaches.  Psychiatric/Behavioral:  Negative for confusion and sleep disturbance.     Objective:  BP 126/84   Pulse 74   Temp 98.2 F (36.8 C) (Oral)   Ht 5' 4 (1.626 m)   Wt 158 lb 6.4 oz (71.8 kg)   SpO2 97%   BMI 27.19 kg/m   BP Readings from Last 3 Encounters:  08/18/24 126/84  08/18/24 130/80  08/04/24 (!) 158/88    Wt Readings from Last 3 Encounters:  08/18/24 158 lb 6.4 oz (71.8 kg)  08/18/24 157  lb 12.8 oz (71.6 kg)  08/04/24 159 lb (72.1 kg)    Physical Exam Constitutional:      General: She is not in acute distress.    Appearance: She is well-developed.  HENT:     Head: Normocephalic.     Right Ear: External ear normal.     Left Ear: External ear normal.     Nose: Nose normal.  Eyes:     General:        Right eye: No discharge.        Left eye: No discharge.     Conjunctiva/sclera: Conjunctivae normal.     Pupils: Pupils are equal, round, and reactive to light.  Neck:     Thyroid : No thyromegaly.     Vascular: No JVD.     Trachea: No tracheal deviation.  Cardiovascular:     Rate and Rhythm: Normal rate and regular rhythm.     Heart sounds: Normal heart sounds.  Pulmonary:     Effort: No respiratory distress.     Breath sounds: No stridor.  No wheezing.  Abdominal:     General: Bowel sounds are normal. There is no distension.     Palpations: Abdomen is soft. There is no mass.     Tenderness: There is no abdominal tenderness. There is no guarding or rebound.  Musculoskeletal:        General: No tenderness.     Cervical back: Normal range of motion and neck supple. No rigidity.  Lymphadenopathy:     Cervical: No cervical adenopathy.  Skin:    Findings: No erythema or rash.  Neurological:     Mental Status: She is oriented to person, place, and time.     Cranial Nerves: No cranial nerve deficit.     Motor: No abnormal muscle tone.     Coordination: Coordination normal.     Deep Tendon Reflexes: Reflexes normal.  Psychiatric:        Behavior: Behavior normal.        Thought Content: Thought content normal.        Judgment: Judgment normal.     Lab Results  Component Value Date   WBC 6.5 08/04/2024   HGB 15.1 (H) 08/04/2024   HCT 45.8 08/04/2024   PLT 411.0 (H) 08/04/2024   GLUCOSE 93 08/04/2024   CHOL 162 01/09/2024   TRIG 53.0 01/09/2024   HDL 69.50 01/09/2024   LDLCALC 82 01/09/2024   ALT 19 08/04/2024   AST 18 08/04/2024   NA 142 08/04/2024   K 4.3 08/04/2024   CL 102 08/04/2024   CREATININE 0.74 08/04/2024   BUN 17 08/04/2024   CO2 29 08/04/2024   TSH 1.62 04/24/2024    ECHOCARDIOGRAM COMPLETE Result Date: 08/07/2024    ECHOCARDIOGRAM REPORT   Patient Name:   JOLISA INTRIAGO Date of Exam: 08/07/2024 Medical Rec #:  [id number]       Height:       64.0 in Accession #:    [id number]      Weight:       159.0 lb Date of Birth:  07/11/1951       BSA:          1.775 m Patient Age:    73 years        BP:           175/91 mmHg Patient Gender: F               HR:  69 bpm. Exam Location:  [address] Procedure: 2D Echo, Cardiac Doppler and Color Doppler (Both Spectral and Color            Flow Doppler were utilized during procedure). Indications:    Chest Pain R07.9  History:        Patient has prior  history of Echocardiogram examinations.                 Pacemaker, Arrythmias:Mobitz Type II; Risk                 Factors:Hyperlipidemia and Hypertension.  Sonographer:    Sharlet Hamilton RDCS Referring Phys: [id number] ANGELA NICOLE DUKE IMPRESSIONS  1. Left ventricular ejection fraction, by estimation, is 55 to 60%. The left ventricle has normal function.  2. Right ventricular systolic function is low normal. The right ventricular size is normal.  3. The mitral valve is normal in structure. Mild mitral valve regurgitation.  4. The aortic valve is tricuspid. Aortic valve regurgitation is not visualized.  5. The inferior vena cava is normal in size with greater than 50% respiratory variability, suggesting right atrial pressure of 3 mmHg. FINDINGS  Left Ventricle: Left ventricular ejection fraction, by estimation, is 55 to 60%. The left ventricle has normal function. The left ventricular internal cavity size was normal in size. There is no left ventricular hypertrophy. Right Ventricle: The right ventricular size is normal. Right vetricular wall thickness was not assessed. Right ventricular systolic function is low normal. Left Atrium: Left atrial size was normal in size. Right Atrium: Right atrial size was normal in size. Pericardium: There is no evidence of pericardial effusion. Mitral Valve: The mitral valve is normal in structure. Mild mitral valve regurgitation. MV peak gradient, 4.1 mmHg. The mean mitral valve gradient is 2.0 mmHg. Tricuspid Valve: The tricuspid valve is normal in structure. Tricuspid valve regurgitation is mild. Aortic Valve: The aortic valve is tricuspid. Aortic valve regurgitation is not visualized. Aortic valve mean gradient measures 3.0 mmHg. Aortic valve peak gradient measures 6.2 mmHg. Pulmonic Valve: The pulmonic valve was grossly normal. Pulmonic valve regurgitation is trivial. Aorta: The aortic root and ascending aorta are structurally normal, with no evidence of dilitation. Venous: The  inferior vena cava is normal in size with greater than 50% respiratory variability, suggesting right atrial pressure of 3 mmHg. IAS/Shunts: No atrial level shunt detected by color flow Doppler.  LEFT VENTRICLE PLAX 2D LVIDd:         3.90 cm LVIDs:         2.70 cm LV PW:         1.20 cm LV IVS:        1.00 cm LVOT diam:     2.00 cm LVOT Area:     3.14 cm  RIGHT VENTRICLE             IVC RV Basal diam:  3.10 cm     IVC diam: 1.50 cm RV Mid diam:    2.40 cm RV S prime:     14.80 cm/s TAPSE (M-mode): 2.3 cm RVSP:           24.9 mmHg LEFT ATRIUM             Index        RIGHT ATRIUM           Index LA diam:        3.90 cm 2.20 cm/m   RA Pressure: 3.00 mmHg LA Vol (A2C):   43.2 ml  24.34 ml/m  RA Area:     15.50 cm LA Vol (A4C):   42.8 ml 24.12 ml/m  RA Volume:   36.00 ml  20.29 ml/m LA Biplane Vol: 52.8 ml 29.75 ml/m  AORTIC VALVE AV Vmax:      125.00 cm/s AV Vmean:     82.000 cm/s AV VTI:       0.235 m AV Peak Grad: 6.2 mmHg AV Mean Grad: 3.0 mmHg  AORTA Ao Root diam: 2.90 cm Ao Asc diam:  3.10 cm MITRAL VALVE              TRICUSPID VALVE MV Peak grad: 4.1 mmHg    TR Peak grad:   21.9 mmHg MV Mean grad: 2.0 mmHg    TR Vmax:        234.00 cm/s MV Vmax:      1.01 m/s    Estimated RAP:  3.00 mmHg MV Vmean:     61.3 cm/s   RVSP:           24.9 mmHg MR Peak grad: 125.4 mmHg MR Vmax:      560.00 cm/s SHUNTS                           Systemic Diam: 2.00 cm Vina Gull MD Electronically signed by Vina Gull MD Signature Date/Time: 08/07/2024/5:03:27 PM    Final     Assessment & Plan:   Problem List Items Addressed This Visit     Carotid stenosis, asymptomatic   Degree of carotid narrowing less than 50% bilaterally by ultrasound Criteria - 2025. New myalgias - hands, feet: Hold Atorvastatin  for 1-2 weeks to see if better      Myalgia - Primary   New - hands, feet: Hold Atorvastatin  for 1-2 weeks to see if better      Polycythemia   Mild ?steroids related Cont to monitor CBC No OSA      Relevant Orders    CBC with Differential/Platelet      No orders of the defined types were placed in this encounter.     Follow-up: Return in about 2 months (around 10/18/2024) for a follow-up visit.  Marolyn Noel, MD

## 2024-08-18 NOTE — Assessment & Plan Note (Addendum)
 Mild ?steroids related Cont to monitor CBC No OSA

## 2024-08-18 NOTE — Patient Instructions (Signed)
 Hold Atorvastatin  for 1-2 weeks

## 2024-08-18 NOTE — Assessment & Plan Note (Signed)
 New - hands, feet: Hold Atorvastatin  for 1-2 weeks to see if better

## 2024-09-01 ENCOUNTER — Other Ambulatory Visit: Payer: Self-pay | Admitting: Physician Assistant

## 2024-09-01 ENCOUNTER — Encounter (HOSPITAL_COMMUNITY): Payer: Self-pay

## 2024-09-01 DIAGNOSIS — R0789 Other chest pain: Secondary | ICD-10-CM

## 2024-09-01 DIAGNOSIS — R9439 Abnormal result of other cardiovascular function study: Secondary | ICD-10-CM

## 2024-09-01 DIAGNOSIS — I2510 Atherosclerotic heart disease of native coronary artery without angina pectoris: Secondary | ICD-10-CM

## 2024-09-03 ENCOUNTER — Ambulatory Visit
Admission: RE | Admit: 2024-09-03 | Discharge: 2024-09-03 | Disposition: A | Discharge status: Home / Self Care | Source: Ambulatory Visit | Attending: Physician Assistant | Admitting: Physician Assistant

## 2024-09-03 DIAGNOSIS — I2510 Atherosclerotic heart disease of native coronary artery without angina pectoris: Secondary | ICD-10-CM | POA: Diagnosis not present

## 2024-09-03 DIAGNOSIS — J984 Other disorders of lung: Secondary | ICD-10-CM | POA: Insufficient documentation

## 2024-09-03 DIAGNOSIS — R0789 Other chest pain: Secondary | ICD-10-CM | POA: Diagnosis present

## 2024-09-03 MED ORDER — RUBIDIUM RB82 GENERATOR (RUBYFILL)
25.0000 | PACK | Freq: Once | INTRAVENOUS | Status: AC
  Administered 2024-09-03: 18.55 via INTRAVENOUS

## 2024-09-03 MED ORDER — REGADENOSON 0.4 MG/5ML IV SOLN
INTRAVENOUS | Status: AC
  Filled 2024-09-03: qty 5

## 2024-09-03 MED ORDER — REGADENOSON 0.4 MG/5ML IV SOLN
0.4000 mg | Freq: Once | INTRAVENOUS | Status: AC
Start: 2024-09-03 — End: 2024-09-03
  Administered 2024-09-03: 0.4 mg via INTRAVENOUS
  Filled 2024-09-03: qty 5

## 2024-09-03 MED ORDER — RUBIDIUM RB82 GENERATOR (RUBYFILL)
25.0000 | PACK | Freq: Once | INTRAVENOUS | Status: AC
  Administered 2024-09-03: 18.53 via INTRAVENOUS

## 2024-09-04 LAB — NM PET CT CARDIAC PERFUSION MULTI W/ABSOLUTE BLOODFLOW
MBFR: 2.77
Nuc Rest EF: 56 %
Nuc Stress EF: 67 %
Peak HR: 107 {beats}/min
Rest HR: 82 {beats}/min
Rest MBF: 0.91 ml/g/min
Rest Nuclear Isotope Dose: 18.5 mCi
SRS: 7
SSS: 1
ST Depression (mm): 0 mm
Stress MBF: 2.52 ml/g/min
Stress Nuclear Isotope Dose: 18.6 mCi
TID: 0.79

## 2024-09-05 NOTE — Progress Notes (Signed)
 Remote PPM Transmission

## 2024-09-06 ENCOUNTER — Ambulatory Visit: Payer: Self-pay | Admitting: Physician Assistant

## 2024-09-08 ENCOUNTER — Telehealth: Payer: Self-pay | Admitting: Internal Medicine

## 2024-09-08 ENCOUNTER — Telehealth (HOSPITAL_BASED_OUTPATIENT_CLINIC_OR_DEPARTMENT_OTHER): Payer: Self-pay | Admitting: *Deleted

## 2024-09-08 NOTE — Telephone Encounter (Signed)
   Patient Name: Anna Ellis  DOB: 05-19-1951 MRN: [id number]  Primary Cardiologist: Alm Clay, MD  Chart reviewed as part of pre-operative protocol coverage. Given past medical history and time since last visit, based on ACC/AHA guidelines, Anna Ellis is at acceptable risk for the planned procedure without further cardiovascular testing.  Recent PET stress test and echocardiogram are reassuring.  If needed, patient may hold aspirin  for 5 to 7 days prior to the surgery and restart as soon as possible afterward at the surgeon's discretion.  I will route this recommendation to the requesting party via Epic fax function and remove from pre-op  pool.  Please call with questions.  Anna Ellis, Anna Ellis 09/08/2024, 1:56 PM

## 2024-09-08 NOTE — Telephone Encounter (Signed)
   Pre-operative Risk Assessment    Patient Name: Anna Ellis  DOB: [birth year] MRN: [id number]   Date of last office visit: 07/02/24 LORETTE KAPUR, [facility] Date of next office visit: 10/09/24 DR. HARDING   Request for Surgical Clearance    Procedure:  LEFT TOTAL KNEE ARTHROPLASTY  Date of Surgery:  Clearance 12/15/24                                Surgeon:  DR. MATTHEW OLIN Surgeon's Group or Practice Name:  [facility] Phone number:  [phone] JOEN SIC  Fax number:  [phone]   Type of Clearance Requested:   - Medical  - Pharmacy:  Hold Aspirin      Type of Anesthesia:  Spinal   Additional requests/questions:    Bonney Niels Jest   09/08/2024, 1:30 PM

## 2024-09-08 NOTE — Telephone Encounter (Signed)
 Spoke with pt regarding Cardiac PET results. Pt verbalized understanding. Pt was advise to call our office if she had any further concerns. Pt thanked me for the call.

## 2024-09-08 NOTE — Telephone Encounter (Signed)
 Surgical clearance form received from [facility] via fax and placed in provider's box up front.

## 2024-10-05 ENCOUNTER — Ambulatory Visit: Admitting: Cardiology

## 2024-10-09 ENCOUNTER — Ambulatory Visit: Attending: Cardiology | Admitting: Cardiology

## 2024-10-09 ENCOUNTER — Encounter: Payer: Self-pay | Admitting: Cardiology

## 2024-10-09 VITALS — BP 136/84 | HR 71 | Ht 64.0 in | Wt 161.0 lb

## 2024-10-09 DIAGNOSIS — E785 Hyperlipidemia, unspecified: Secondary | ICD-10-CM

## 2024-10-09 DIAGNOSIS — I493 Ventricular premature depolarization: Secondary | ICD-10-CM

## 2024-10-09 DIAGNOSIS — I10 Essential (primary) hypertension: Secondary | ICD-10-CM

## 2024-10-09 DIAGNOSIS — Z01810 Encounter for preprocedural cardiovascular examination: Secondary | ICD-10-CM | POA: Diagnosis not present

## 2024-10-09 DIAGNOSIS — I441 Atrioventricular block, second degree: Secondary | ICD-10-CM | POA: Diagnosis not present

## 2024-10-09 DIAGNOSIS — I6523 Occlusion and stenosis of bilateral carotid arteries: Secondary | ICD-10-CM

## 2024-10-09 DIAGNOSIS — I340 Nonrheumatic mitral (valve) insufficiency: Secondary | ICD-10-CM

## 2024-10-09 DIAGNOSIS — I2510 Atherosclerotic heart disease of native coronary artery without angina pectoris: Secondary | ICD-10-CM

## 2024-10-09 NOTE — Patient Instructions (Addendum)
 Medication Instructions:  NO CHANGES   *If you need a refill on your cardiac medications before your next appointment, please call your pharmacy*   Lab Work: NOT NEEDED    Testing/Procedures:   NOT NEEDED  Follow-Up: At [facility], you and your health needs are our priority.  As part of our continuing mission to provide you with exceptional heart care, we have created designated Provider Care Teams.  These Care Teams include your primary Cardiologist (physician) and Advanced Practice Providers (APPs -  Physician Assistants and Nurse Practitioners) who all work together to provide you with the care you need, when you need it.     Your next appointment:   27 month(s) FROM JAN 2028  The format for your next appointment:   In Person  Provider:   Alm Clay, MD   Other Instructions    MORNING  OF PRE -OP ASSESSMENT TAKE BOTH LOSARTAN  DOS I THE MORNING   THE DAY OF SURGERY TAKE BOT DOSE OF LOSARTAN  THE NIGHT BEFORE

## 2024-10-09 NOTE — Progress Notes (Signed)
 Cardiology Office Note:  .   Date:  10/12/2024  ID:  Anna Ellis, DOB [birth year], MRN [id number] PCP: [name], MD  [facility] Providers Cardiologist:  Alm Clay, MD Electrophysiologist:  Eulas FORBES Furbish, MD     Chief Complaint  Patient presents with   Follow-up    Here to discuss results of echocardiogram and stress test; preoperative evaluation    Patient Profile: .     Anna Ellis is a 73 y.o. female with a PMH notable for nonobstructive CAD and PPM placement for Mobitz type II along with HTN HLD who presents here for 75-month follow-up to discuss test results at the request of Plotnikov, Karlynn GAILS, MD.   CAD (Cath 2019: Minimal CAD with 20% proximal LAD and 25% mid LAD),  Mobitz II s/p His Bundle dual chamber PPM,  Last checked was with Daphne Lever, NP from EP on January 17th 2025: EKG showed V PM with normal PPM function.  Also noted mildly elevated pressures. HTN, HLD,  chronic headaches, GERD, duodenal stricture & IDA      Anna Ellis was last seen on July 02, 2024 by Lorette Kapur, [place] for routine follow-up and evaluation of episode of chest pain that occurred on May 17, 2024.  She described a burning sensation in the chest that was 6 out of 10 and lasted for several seconds it was midsternal and occurred while walking.  She then also noticed some twinging sensation in her left and right breast.  She thought maybe she had some irregularity in her heart rate but the device interrogation did not show any abnormalities.  Apparently she had also had a Schatzki's ring identified during endoscopy.  By the time she was seen in this clinic visit, she denied any active symptoms.  She has required preoperative assessment for knee replacement surgery.  For this reason she was evaluated with an Echocardiogram and Cardiac Stress PET.  Also they recommend that she increase Lipitor to 28 to 10 mg (she noted that she was having difficulty taking the higher  dose)  Subjective  Discussed the use of AI scribe software for clinical note transcription with the patient, who gave verbal consent to proceed.  History of Present Illness Anna Ellis is a 73 year old female with coronary artery disease and a history of pacemaker placement who presents for preoperative cardiac evaluation for knee replacement surgery. She was referred by Dr. Plotnikoff for preoperative cardiac evaluation.  She has a history of coronary artery disease, initially identified through a heart catheterization in July 2019, which showed mild non-obstructive coronary artery disease with 20-25% stenosis in the proximal and mid LAD. She has a pacemaker placed and follows up for its management.  She has been experiencing severe indigestion following a colonoscopy, which led to a delay in her planned knee replacement surgery. An abdominal CT scan in April showed some aortic atherosclerosis. She also had an ultrasound and a second CT scan, which were not detailed.  She has been on atorvastatin  10 mg for many years, which was increased recently, leading to symptoms of pulsating veins and tingling. After stopping the medication for two weeks, her symptoms improved slightly. She has been off atorvastatin  for about two and a half weeks and reports feeling better.  She takes losartan  25 mg twice daily for blood pressure management. Her blood pressure is usually in the 120s to 130s at home. She feels lightheaded when her blood pressure was low on one  occasion.  No chest pain, tightness, or pressure with exertion, and no shortness of breath when lying flat or waking up. No pain in her calves or thighs when walking, heart racing, or skipping beats. She experiences knee pain due to end-stage arthritis, for which she is planning a knee replacement surgery.  She has a history of two ACL surgeries and has been diagnosed with end-stage arthritis in her knees, leading to significant pain and functional  limitations. She is planning a knee replacement surgery in January 2026-it was delayed because she was undergoing stress test and echo evaluation..   Cardiovascular ROS: no chest pain or dyspnea on exertion positive for - She only has exercise intolerance due to knee pain; she noted myalgias and arthralgias as well as upset stomach with the increased dose of atorvastatin .  She stopped taking it 2 to 3 weeks ago.. negative for - edema, irregular heartbeat, orthopnea, palpitations, paroxysmal nocturnal dyspnea, rapid heart rate, shortness of breath, or lightheadedness, dizziness or wooziness, syncope or near syncope, TIA or emesis fugax.  ROS:  Review of Systems - Negative except symptoms noted above.    Objective   Past Medical History - Bradycardia with pacemaker placement - Coronary artery disease with mild nonobstructive coronary plaque - Aortic atherosclerosis - Mild mitral regurgitation - Hypertension - Hyperlipidemia - End-stage knee osteoarthritis - Bronchitis  Surgical History: - Pacemaker placement: Performed by Doctor Allred - ACL reconstruction: Two surgeries with donor ACL  Current Meds Aspirin  81 mg daily Losartan  25 mg-2 tabs daily -Not currently taking atorvastatin  20 mg daily  Medication Sig   acetaminophen  (TYLENOL ) 500 MG tablet Take 1,000 mg by mouth every 6 (six) hours as needed (for pain.).   ALPRAZolam  (XANAX ) 0.25 MG tablet Take 1 tablet (0.25 mg total) by mouth 2 (two) times daily as needed for sleep or anxiety.   cholecalciferol (VITAMIN D3) 25 MCG (1000 UNIT) tablet Take 1,000 Units by mouth daily.   dicyclomine  (BENTYL ) 10 MG capsule Take 1 capsule (10 mg total) by mouth 3 (three) times daily as needed for spasms.   Famotidine (PEPCID AC PO) Take 1 tablet by mouth daily in the afternoon.   ipratropium (ATROVENT ) 0.06 % nasal spray Place 2 sprays into the nose 3 (three) times daily.   metroNIDAZOLE  (METROCREAM ) 0.75 % cream Apply 1 application topically  daily as needed (for rosacea).   montelukast  (SINGULAIR ) 10 MG tablet Take 1 tablet (10 mg total) by mouth daily.   nitroGLYCERIN  (NITROSTAT ) 0.4 MG SL tablet Place 1 tablet (0.4 mg total) under the tongue every 5 (five) minutes as needed for chest pain.   omeprazole  (PRILOSEC) 40 MG capsule Take 1 capsule (40 mg total) by mouth daily.   Simethicone (GAS-X PO) Take 1 tablet by mouth daily in the afternoon.   vitamin E 1000 UNIT capsule Take 1,000 Units by mouth daily.   XIIDRA 5 % SOLN Two doses per tube - per eye DR    Studies Reviewed: Anna Ellis        Lab Results  Component Value Date   CHOL 162 01/09/2024   HDL 69.50 01/09/2024   LDLCALC 82 01/09/2024   TRIG 53.0 01/09/2024   CHOLHDL 2 01/09/2024   Results RADIOLOGY Abdominal CT: Aortic atherosclerosis (03/2024)  DIAGNOSTIC Cardiac Catheterization: Mild stenosis in proximal LAD and mid LAD, 20-25%, otherwise normal coronary arteries (06/2018)   Echocardiogram: Normal left ventricular function-ejection Fraction 55-60%, no RWMA.  Mild mitral regurgitation, normal right ventricle size and function, normal aortic valve, normal  inferior vena cava and right atrium size (08/07/2024) Cardiac PET Stress Test: NORMAL LOW RISK-no ischemia-no infarction.,Resting EF 56% => Stress EF 67%, no evidence of prior myocardial infarction, no decreased perfusion, mild coronary calcification (09/03/2024) Carotid ultrasound: Normal (08/09/2024) PPM interrogation 07/16/2024: AV dual paced.  Battery and lead parameters stable with capture and sensing stable.  Appropriate functioning.  No significant arrhythmias.    Risk Assessment/Calculations:          Physical Exam:   VS:  BP 136/84   Pulse 71   Ht 5' 4 (1.626 m)   Wt 161 lb (73 kg)   SpO2 99%   BMI 27.64 kg/m    Wt Readings from Last 3 Encounters:  10/09/24 161 lb (73 kg)  08/18/24 158 lb 6.4 oz (71.8 kg)  08/18/24 157 lb 12.8 oz (71.6 kg)     GEN: Well nourished, well groomed in no acute  distress; otherwise healthy appearing NECK: No JVD; No carotid bruits CARDIAC: Normal S1, S2; RRR, no murmurs, rubs, gallops RESPIRATORY:  Clear to auscultation without rales, wheezing or rhonchi ; nonlabored, good air movement. ABDOMEN: Soft, non-tender, non-distended EXTREMITIES:  No edema; No deformity      ASSESSMENT AND PLAN: .    Problem List Items Addressed This Visit       Cardiology Problems   Carotid stenosis, asymptomatic (Chronic)   Normal carotid ultrasounds.      Coronary artery disease, non-occlusive (Chronic)   Mild coronary calcification and nonobstructive plaque with no significant progression. Cardiac stress PET showed no evidence of ischemia or infarction with normal EF and appropriate improvement in EF with stress.  This would argue that there is no progression of disease. - Continue routine follow-up for coronary artery disease. - Continue with soft for statin, and ARB for blood pressure and lipid control. - Continue 81 mg aspirin  although this can be held 5 to 7 days preop for surgeries or procedures.      Frequent PVCs (Chronic)   Asymptomatic.  Not on beta-blocker.  PPM in place.      HTN (hypertension) (Chronic)   Losartan  has been used intermittently due to symptoms.  Initial BP was elevated- likely due to white coat syndrome. She is currently taking losartan  25 mg twice daily - Monitor blood pressure at home regularly. - Take two losartan  tablets the morning of preoperative assessment if blood pressure is elevated. - Take two losartan  tablets the night before surgery.      Hyperlipidemia LDL goal <70 (Chronic)   LDL of 82-pretty well controlled given the fact that she only has modest CAD.  Would like to see LDL less than 70 but she is not tolerating Intolerance to atorvastatin  with vein pulsation and tingling. LDL was 82 on 10 mg atorvastatin . - Discontinue atorvastatin  for one month. - Restart atorvastatin  10 mg every other day after one month,  then increase to daily if tolerated.the 20 mg atorvastatin . => Low threshold to convert to rosuvastatin 10 mg.      Mild mitral regurgitation (Chronic)   Not causing symptoms. Noted on recent echocardiogram with normal cardiac function. - Monitor for any new symptoms or changes in cardiac function.  If murmur changes or symptoms change, would reassess with echo.      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  Functioning well.  No major issues. Pacemaker functioning well with regular follow-up. - Continue regular pacemaker monitoring. - Schedule follow-up with cardiologist every two  to three years.        Other   Preoperative cardiovascular examination - Primary   Low cardiovascular risk for surgery. Normal stress test and echocardiogram with mild mitral regurgitation. - Proceed with planned left knee replacement surgery on January 20th. - Monitor blood pressure closely before surgery.     Ms. Habib's perioperative risk of a major cardiac event is 0.4% according to the Revised Cardiac Risk Index (RCRI).  Therefore, she is at low risk for perioperative complications.   Her functional capacity is good at 7.25 METs according to the Duke Activity Status Index (DASI). Recommendations: According to ACC/AHA guidelines, no further cardiovascular testing needed.  The patient may proceed to surgery at acceptable risk.   Antiplatelet and/or Anticoagulation Recommendations: Aspirin  can be held for 5-7 days prior to her surgery.  Please resume Aspirin  post operatively when it is felt to be safe from a bleeding standpoint.  She is not on other medications that need to be held.                Follow-Up: Return in about 27 months (around 01/09/2027).  I spent 48 minutes in the care of Anna Ellis today including reviewing labs (1 minute), reviewing studies (echocardiogram and stress PET reviewed-5 minutes), face to face time discussing treatment  options (24 minutes), reviewing records from My note from 2019 as well as several APP notes and EP notes  (7 minutes), 11 minutes dictating, and documenting in the encounter.      Signed, Alm MICAEL Clay, MD, MS Alm Clay, M.D., M.S. Interventional Cardiologist  [facility] Pager # [phone]

## 2024-10-12 ENCOUNTER — Encounter: Payer: Self-pay | Admitting: Cardiology

## 2024-10-12 DIAGNOSIS — I340 Nonrheumatic mitral (valve) insufficiency: Secondary | ICD-10-CM | POA: Insufficient documentation

## 2024-10-12 DIAGNOSIS — I2510 Atherosclerotic heart disease of native coronary artery without angina pectoris: Secondary | ICD-10-CM | POA: Insufficient documentation

## 2024-10-12 NOTE — Assessment & Plan Note (Signed)
 Not causing symptoms. Noted on recent echocardiogram with normal cardiac function. - Monitor for any new symptoms or changes in cardiac function.  If murmur changes or symptoms change, would reassess with echo.

## 2024-10-12 NOTE — Assessment & Plan Note (Signed)
 Status post pacemaker placement in 2020.  Functioning well.  No major issues. Pacemaker functioning well with regular follow-up. - Continue regular pacemaker monitoring. - Schedule follow-up with cardiologist every two to three years.

## 2024-10-12 NOTE — Assessment & Plan Note (Signed)
 LDL of 82-pretty well controlled given the fact that she only has modest CAD.  Would like to see LDL less than 70 but she is not tolerating Intolerance to atorvastatin  with vein pulsation and tingling. LDL was 82 on 10 mg atorvastatin . - Discontinue atorvastatin  for one month. - Restart atorvastatin  10 mg every other day after one month, then increase to daily if tolerated.the 20 mg atorvastatin . => Low threshold to convert to rosuvastatin 10 mg.

## 2024-10-12 NOTE — Assessment & Plan Note (Signed)
Normal carotid ultrasounds!

## 2024-10-12 NOTE — Assessment & Plan Note (Signed)
 Losartan  has been used intermittently due to symptoms.  Initial BP was elevated- likely due to white coat syndrome. She is currently taking losartan  25 mg twice daily - Monitor blood pressure at home regularly. - Take two losartan  tablets the morning of preoperative assessment if blood pressure is elevated. - Take two losartan  tablets the night before surgery.

## 2024-10-12 NOTE — Assessment & Plan Note (Signed)
 Mild coronary calcification and nonobstructive plaque with no significant progression. Cardiac stress PET showed no evidence of ischemia or infarction with normal EF and appropriate improvement in EF with stress.  This would argue that there is no progression of disease. - Continue routine follow-up for coronary artery disease. - Continue with soft for statin, and ARB for blood pressure and lipid control. - Continue 81 mg aspirin  although this can be held 5 to 7 days preop for surgeries or procedures.

## 2024-10-12 NOTE — Assessment & Plan Note (Signed)
 Low cardiovascular risk for surgery. Normal stress test and echocardiogram with mild mitral regurgitation. - Proceed with planned left knee replacement surgery on January 20th. - Monitor blood pressure closely before surgery.     Ms. Xia's perioperative risk of a major cardiac event is 0.4% according to the Revised Cardiac Risk Index (RCRI).  Therefore, she is at low risk for perioperative complications.   Her functional capacity is good at 7.25 METs according to the Duke Activity Status Index (DASI). Recommendations: According to ACC/AHA guidelines, no further cardiovascular testing needed.  The patient may proceed to surgery at acceptable risk.   Antiplatelet and/or Anticoagulation Recommendations: Aspirin  can be held for 5-7 days prior to her surgery.  Please resume Aspirin  post operatively when it is felt to be safe from a bleeding standpoint.  She is not on other medications that need to be held.

## 2024-10-12 NOTE — Assessment & Plan Note (Signed)
 Asymptomatic.  Not on beta-blocker.  PPM in place.

## 2024-10-14 ENCOUNTER — Other Ambulatory Visit

## 2024-10-14 DIAGNOSIS — D751 Secondary polycythemia: Secondary | ICD-10-CM

## 2024-10-14 LAB — CBC WITH DIFFERENTIAL/PLATELET
Basophils Absolute: 0 K/uL (ref 0.0–0.1)
Basophils Relative: 1.1 % (ref 0.0–3.0)
Eosinophils Absolute: 0.2 K/uL (ref 0.0–0.7)
Eosinophils Relative: 5.3 % — ABNORMAL HIGH (ref 0.0–5.0)
HCT: 40.7 % (ref 36.0–46.0)
Hemoglobin: 13.7 g/dL (ref 12.0–15.0)
Lymphocytes Relative: 27.5 % (ref 12.0–46.0)
Lymphs Abs: 1.2 K/uL (ref 0.7–4.0)
MCHC: 33.5 g/dL (ref 30.0–36.0)
MCV: 95.4 fl (ref 78.0–100.0)
Monocytes Absolute: 0.6 K/uL (ref 0.1–1.0)
Monocytes Relative: 13.3 % — ABNORMAL HIGH (ref 3.0–12.0)
Neutro Abs: 2.3 K/uL (ref 1.4–7.7)
Neutrophils Relative %: 52.8 % (ref 43.0–77.0)
Platelets: 307 K/uL (ref 150.0–400.0)
RBC: 4.27 Mil/uL (ref 3.87–5.11)
RDW: 14.2 % (ref 11.5–15.5)
WBC: 4.3 K/uL (ref 4.0–10.5)

## 2024-10-15 ENCOUNTER — Ambulatory Visit: Payer: Medicare Other

## 2024-10-15 DIAGNOSIS — I441 Atrioventricular block, second degree: Secondary | ICD-10-CM | POA: Diagnosis not present

## 2024-10-18 ENCOUNTER — Ambulatory Visit: Payer: Self-pay | Admitting: Internal Medicine

## 2024-10-18 LAB — CUP PACEART REMOTE DEVICE CHECK
Battery Remaining Longevity: 39 mo
Battery Voltage: 2.94 V
Brady Statistic AP VP Percent: 42.61 %
Brady Statistic AP VS Percent: 0 %
Brady Statistic AS VP Percent: 57.13 %
Brady Statistic AS VS Percent: 0.26 %
Brady Statistic RA Percent Paced: 42.63 %
Brady Statistic RV Percent Paced: 99.74 %
Date Time Interrogation Session: 20251105221536
Implantable Lead Connection Status: 753985
Implantable Lead Connection Status: 753985
Implantable Lead Implant Date: 20190801
Implantable Lead Implant Date: 20190801
Implantable Lead Location: 753859
Implantable Lead Location: 753860
Implantable Lead Model: 3830
Implantable Lead Model: 5076
Implantable Pulse Generator Implant Date: 20190801
Lead Channel Impedance Value: 342 Ohm
Lead Channel Impedance Value: 380 Ohm
Lead Channel Impedance Value: 380 Ohm
Lead Channel Impedance Value: 475 Ohm
Lead Channel Pacing Threshold Amplitude: 0.75 V
Lead Channel Pacing Threshold Amplitude: 0.875 V
Lead Channel Pacing Threshold Pulse Width: 0.4 ms
Lead Channel Pacing Threshold Pulse Width: 0.4 ms
Lead Channel Sensing Intrinsic Amplitude: 4 mV
Lead Channel Sensing Intrinsic Amplitude: 4 mV
Lead Channel Sensing Intrinsic Amplitude: 8.5 mV
Lead Channel Sensing Intrinsic Amplitude: 8.5 mV
Lead Channel Setting Pacing Amplitude: 2 V
Lead Channel Setting Pacing Amplitude: 2.5 V
Lead Channel Setting Pacing Pulse Width: 0.4 ms
Lead Channel Setting Sensing Sensitivity: 1.2 mV
Zone Setting Status: 755011
Zone Setting Status: 755011

## 2024-10-20 ENCOUNTER — Ambulatory Visit: Admitting: Internal Medicine

## 2024-10-20 ENCOUNTER — Encounter: Payer: Self-pay | Admitting: Internal Medicine

## 2024-10-20 VITALS — BP 156/88 | HR 70 | Temp 97.6°F | Ht 64.0 in | Wt 158.0 lb

## 2024-10-20 DIAGNOSIS — I10 Essential (primary) hypertension: Secondary | ICD-10-CM | POA: Diagnosis not present

## 2024-10-20 DIAGNOSIS — R590 Localized enlarged lymph nodes: Secondary | ICD-10-CM

## 2024-10-20 DIAGNOSIS — F419 Anxiety disorder, unspecified: Secondary | ICD-10-CM | POA: Diagnosis not present

## 2024-10-20 DIAGNOSIS — D751 Secondary polycythemia: Secondary | ICD-10-CM | POA: Diagnosis not present

## 2024-10-20 MED ORDER — VALACYCLOVIR HCL 1 G PO TABS: 1000.0000 mg | ORAL_TABLET | Freq: Three times a day (TID) | ORAL | 0 refills | Status: DC

## 2024-10-20 NOTE — Progress Notes (Signed)
 Remote PPM Transmission

## 2024-10-20 NOTE — Assessment & Plan Note (Signed)
 Mild ?steroids related Cont to monitor CBC No OSA

## 2024-10-20 NOTE — Assessment & Plan Note (Signed)
BP is nl at home 

## 2024-10-20 NOTE — Assessment & Plan Note (Signed)
Chronic  Xanax prn  Potential benefits of a long term benzodiazepines  use as well as potential risks  and complications were explained to the patient and were aknowledged. 

## 2024-10-20 NOTE — Assessment & Plan Note (Signed)
 Persistent  L throat pain. L neck in the submandibular area is tender, swollen glands 3 months post - EGD in 06/2024. Tiny erosion on the L tongue. ENT ref Empiric Valtrex

## 2024-10-20 NOTE — Progress Notes (Signed)
 Subjective:  Patient ID: Anna Ellis, female    DOB: 12-12-1950  Age: 73 y.o. MRN: [id number]  CC: Medical Management of Chronic Issues (2 Month follow up. Last seen for general myalgia believed to be originating from statin medication)   HPI [name] presents for myalgias, burning in UE muscles - 50% better off Lipitor C/o ST L side post -EGD C/o L tongue lesion  Outpatient Medications Prior to Visit  Medication Sig Dispense Refill   acetaminophen  (TYLENOL ) 500 MG tablet Take 1,000 mg by mouth every 6 (six) hours as needed (for pain.).     ALPRAZolam  (XANAX ) 0.25 MG tablet Take 1 tablet (0.25 mg total) by mouth 2 (two) times daily as needed for sleep or anxiety. 60 tablet 2   aspirin  EC 81 MG tablet Take 81 mg by mouth daily.     cholecalciferol (VITAMIN D3) 25 MCG (1000 UNIT) tablet Take 1,000 Units by mouth daily.     dicyclomine  (BENTYL ) 10 MG capsule Take 1 capsule (10 mg total) by mouth 3 (three) times daily as needed for spasms. 90 capsule 3   Famotidine (PEPCID AC PO) Take 1 tablet by mouth daily in the afternoon.     ipratropium (ATROVENT ) 0.06 % nasal spray Place 2 sprays into the nose 3 (three) times daily. 15 mL 2   losartan  (COZAAR ) 25 MG tablet Take 25 mg by mouth 2 (two) times daily.     metroNIDAZOLE  (METROCREAM ) 0.75 % cream Apply 1 application topically daily as needed (for rosacea). 45 g 3   montelukast  (SINGULAIR ) 10 MG tablet Take 1 tablet (10 mg total) by mouth daily. 90 tablet 3   nitroGLYCERIN  (NITROSTAT ) 0.4 MG SL tablet Place 1 tablet (0.4 mg total) under the tongue every 5 (five) minutes as needed for chest pain. 25 tablet 5   omeprazole  (PRILOSEC) 40 MG capsule Take 1 capsule (40 mg total) by mouth daily. 30 capsule 6   Simethicone (GAS-X PO) Take 1 tablet by mouth daily in the afternoon.     vitamin E 1000 UNIT capsule Take 1,000 Units by mouth daily.     XIIDRA 5 % SOLN Two doses per tube - per eye DR     atorvastatin  (LIPITOR) 20 MG tablet Take  1 tablet (20 mg total) by mouth daily. (Patient not taking: Reported on 10/20/2024) 90 tablet 3   No facility-administered medications prior to visit.    ROS: Review of Systems  Constitutional:  Negative for activity change, appetite change, chills, fatigue and unexpected weight change.  HENT:  Negative for congestion, mouth sores and sinus pressure.   Eyes:  Negative for visual disturbance.  Respiratory:  Negative for cough and chest tightness.   Cardiovascular:  Negative for leg swelling.  Gastrointestinal:  Negative for abdominal pain and nausea.  Genitourinary:  Negative for difficulty urinating, frequency and vaginal pain.  Musculoskeletal:  Positive for arthralgias and myalgias. Negative for back pain and gait problem.  Skin:  Negative for pallor and rash.  Neurological:  Negative for dizziness, tremors, weakness, numbness and headaches.  Psychiatric/Behavioral:  Negative for confusion, sleep disturbance and suicidal ideas. The patient is not nervous/anxious.     Objective:  BP (!) 156/88   Pulse 70   Temp 97.6 F (36.4 C)   Ht 5' 4 (1.626 m)   Wt 158 lb (71.7 kg)   SpO2 97%   BMI 27.12 kg/m   BP Readings from Last 3 Encounters:  10/20/24 (!) 156/88  10/09/24 136/84  09/03/24 132/71    Wt Readings from Last 3 Encounters:  10/20/24 158 lb (71.7 kg)  10/09/24 161 lb (73 kg)  08/18/24 158 lb 6.4 oz (71.8 kg)    Physical Exam Constitutional:      General: She is not in acute distress.    Appearance: Normal appearance. She is well-developed.  HENT:     Head: Normocephalic.     Right Ear: External ear normal.     Left Ear: External ear normal.     Nose: Nose normal.  Eyes:     General:        Right eye: No discharge.        Left eye: No discharge.     Conjunctiva/sclera: Conjunctivae normal.     Pupils: Pupils are equal, round, and reactive to light.  Neck:     Thyroid : No thyromegaly.     Vascular: No JVD.     Trachea: No tracheal deviation.   Cardiovascular:     Rate and Rhythm: Normal rate and regular rhythm.     Heart sounds: Normal heart sounds.  Pulmonary:     Effort: No respiratory distress.     Breath sounds: No stridor. No wheezing.  Abdominal:     General: Bowel sounds are normal. There is no distension.     Palpations: Abdomen is soft. There is no mass.     Tenderness: There is no abdominal tenderness. There is no guarding or rebound.  Musculoskeletal:        General: No tenderness.     Cervical back: Normal range of motion and neck supple. No rigidity.  Lymphadenopathy:     Cervical: No cervical adenopathy.  Skin:    Findings: No erythema or rash.  Neurological:     Mental Status: She is oriented to person, place, and time.     Cranial Nerves: No cranial nerve deficit.     Motor: No abnormal muscle tone.     Coordination: Coordination normal.     Gait: Gait normal.     Deep Tendon Reflexes: Reflexes normal.  Psychiatric:        Behavior: Behavior normal.        Thought Content: Thought content normal.        Judgment: Judgment normal.   L neck in the submandibular area is tender, swollen glands Tiny erosion+bumps on the L tongue  Lab Results  Component Value Date   WBC 4.3 10/14/2024   HGB 13.7 10/14/2024   HCT 40.7 10/14/2024   PLT 307.0 10/14/2024   GLUCOSE 93 08/04/2024   CHOL 162 01/09/2024   TRIG 53.0 01/09/2024   HDL 69.50 01/09/2024   LDLCALC 82 01/09/2024   ALT 19 08/04/2024   AST 18 08/04/2024   NA 142 08/04/2024   K 4.3 08/04/2024   CL 102 08/04/2024   CREATININE 0.74 08/04/2024   BUN 17 08/04/2024   CO2 29 08/04/2024   TSH 1.62 04/24/2024    NM PET CT CARDIAC PERFUSION MULTI W/ABSOLUTE BLOODFLOW Result Date: 09/04/2024   The study is normal. The study is low risk.   LV perfusion is normal. There is no evidence of ischemia. There is no evidence of infarction. Decreased perfusion in a small area of the apex with normal wall motion suggests artifact.   Rest left ventricular  function is normal. Rest EF: 56%. Stress left ventricular function is normal. Stress EF: 67%. End diastolic cavity size is normal. End systolic cavity size is normal. No evidence of transient  ischemic dilation (TID) noted.   Myocardial blood flow was computed to be 0.91ml/g/min at rest and 2.52ml/g/min at stress. Global myocardial blood flow reserve was 2.77 and was normal.   Coronary calcium  was present on the attenuation correction CT images. Mild coronary calcifications were present. Coronary calcifications were present in the left anterior descending artery distribution(s).   Electronically signed by: Soyla DELENA Merck, MD CLINICAL DATA:  This over-read does not include interpretation of cardiac or coronary anatomy or pathology. The Cardiac PET CT interpretation by the cardiologist is attached. COMPARISON:  None available FINDINGS: Vascular: Left chest wall pacer with leads in the right atrial appendage and right ventricle. Coronary artery calcifications noted. Mediastinum/Nodes: No mediastinal mass or adenopathy identified. Lungs/Pleura: Mosaic attenuation pattern noted which may reflect underlying small airways disease. No pleural fluid, interstitial edema or pneumothorax. Upper Abdomen: No acute abnormality within the imaged portions of the upper abdomen. Cholecystectomy. Unchanged too small to characterize low-density structure in lateral segment of left lobe of liver measuring 6 mm, image 66/3. Musculoskeletal: No acute osseous findings IMPRESSION: 1. Mosaic attenuation pattern within the lungs noted, which may reflect underlying small airways disease. 2. Coronary artery calcifications noted. Electronically Signed   By: Waddell Calk M.D.   On: 09/04/2024 08:14   Assessment & Plan:   Problem List Items Addressed This Visit     Anxiety disorder   Chronic  Xanax  prn  Potential benefits of a long term benzodiazepines  use as well as potential risks  and complications were explained to the patient  and were aknowledged.      Cervical adenopathy   Persistent  L throat pain. L neck in the submandibular area is tender, swollen glands 3 months post - EGD in 06/2024. Tiny erosion on the L tongue. ENT ref Empiric Valtrex      Relevant Orders   Ambulatory referral to ENT   HTN (hypertension) (Chronic)   BP is nl at home      Polycythemia - Primary   Mild ?steroids related Cont to monitor CBC No OSA         Meds ordered this encounter  Medications   valACYclovir (VALTREX) 1000 MG tablet    Sig: Take 1 tablet (1,000 mg total) by mouth 3 (three) times daily.    Dispense:  21 tablet    Refill:  0      Follow-up: Return in about 3 months (around 01/20/2025) for a follow-up visit.  Marolyn Noel, MD

## 2024-10-21 ENCOUNTER — Ambulatory Visit: Payer: Self-pay | Admitting: Cardiovascular Disease

## 2024-11-03 ENCOUNTER — Encounter (INDEPENDENT_AMBULATORY_CARE_PROVIDER_SITE_OTHER): Payer: Self-pay | Admitting: Otolaryngology

## 2024-11-03 ENCOUNTER — Ambulatory Visit (INDEPENDENT_AMBULATORY_CARE_PROVIDER_SITE_OTHER): Admitting: Otolaryngology

## 2024-11-03 VITALS — BP 147/85 | HR 91 | Ht 64.0 in | Wt 156.0 lb

## 2024-11-03 DIAGNOSIS — R0982 Postnasal drip: Secondary | ICD-10-CM | POA: Diagnosis not present

## 2024-11-03 DIAGNOSIS — K219 Gastro-esophageal reflux disease without esophagitis: Secondary | ICD-10-CM | POA: Diagnosis not present

## 2024-11-03 DIAGNOSIS — M542 Cervicalgia: Secondary | ICD-10-CM | POA: Diagnosis not present

## 2024-11-03 NOTE — Progress Notes (Signed)
 Dear Dr. Garald, Here is my assessment for our mutual patient, Anna Ellis. Thank you for allowing me the opportunity to care for your patient. Please do not hesitate to contact me should you have any other questions. Sincerely, Dr. Eldora Blanch  Otolaryngology Clinic Note Referring provider: Dr. Garald HPI:  Initial visit (10/2024): Discussed the use of AI scribe software for clinical note transcription with the patient, who gave verbal consent to proceed.  History of Present Illness Anna Ellis is a 73 year old female who presents with persistent throat discomfort and swollen lymph nodes.  Patient notes that since this summer, she has had left-sided throat and neck discomfort. Seems as if started around GI procedure/Endo with cough/hoarseness/neck discomfort. Diagnosed with bronchitis, treated with abx, which have helped some but discomfort and swelling persists. She did have a CT scan which is noted as below. In addition, she is worried about throat cancer as she reports that she has recurrent sores on the back of her tongue. Orajel and a course of valacyclovir  gave temporary relief. A steroid mouthwash seems to worsen the sores. She does have h/o throat cancer. Sores come and go  She does have history of gastritis, hiatal hernia, and a Schatzki ring with prior esophageal dilation. She takes omeprazole  20 mg daily with improved reflux.  Of note, certain head positions and a heating pad reduce her neck pain.  She has never smoked or consumed alcohol.  Patient otherwise denies: - regular dysphagia, odynophagia, significant unintentional weight loss - changes in voice, shortness of breath, hemoptysis - ear pain  Personal or FHx of bleeding dz or anesthesia difficulty: no  GLP-1: no AP/AC: ASA 81  Tobacco: no  PMHx: HTN, Polycythemia, GERD, CAD, Mobitz Type II s/p PPM  Independent Review of Additional Tests or Records:  CT Neck with contrast (08/05/2024) interpreted:  unable to appreciate any worrisome/necrotic LAD or SMG or parotid nodules or other enhancing masses; slight asymmetry left GT sulcus; Report also notes ----- Advanced disc degeneration from C4-5 through C6-7. At least moderate neural foraminal stenosis on the right at C4-5 and bilaterally at C6-7 due to uncovertebral spurring. Mild spinal stenosis at C4-5 and likely moderate spinal stenosis at C5-6. CBC w/diff 10/14/2024: WBC 4.3, Hgb 13.7; ESR/CRP 08/04/2024: wnl Dr. Garald (10/20/2024): noted left sided tongue lesion(?) after EGD; left throat pain, L submandibular area is tender; Dx: Cervical adenopathy? And tongue lesion; Rx: Ref to ENT, Valtrex  Dr. Garald (07/2024): severe GERD flare up after colonoscopy, EGD 06/2024, ceftin  PO finishing, L neck Pain -- Dx: CT Neck  GI visit 06/19/2024 Alan Coombs: noted dysphagia, not repsonsive to PPI -- given alginate, PPI + PEpcid Dr. Federico Offer (06/29/2024) EGD:   PMH/Meds/All/SocHx/FamHx/ROS:   Past Medical History:  Diagnosis Date   ALLERGIC RHINITIS 09/19/2007   Allergy    ANEMIA-IRON DEFICIENCY 09/19/2007   ANXIETY 09/19/2007   Arthritis    neck (07/10/2018)   Basal cell cancer    back of my neck; scraped it off   CAROTID BRUIT 01/08/2011   Chronic headaches    GERD (gastroesophageal reflux disease)    High blood pressure    History of gallstones    Hyperlipidemia    Mobitz II    a. s/p MDT dual chamber (His Bundle) pacemaker 07/2018 - Dr Kelsie   OSTEOARTHRITIS 09/19/2007   Pneumonia      Past Surgical History:  Procedure Laterality Date   ANTERIOR CRUCIATE LIGAMENT REPAIR Left 2001   donor ACL   BREAST  LUMPECTOMY Bilateral 2002   both benign   COLONOSCOPY  09/12/2017   Dr Luis. have diverticulosis   ESOPHAGOGASTRODUODENOSCOPY  1994   INGUINAL HERNIA REPAIR Right 1971   KNEE CARTILAGE SURGERY Left 1986   w/ACL repair   LAPAROSCOPIC CHOLECYSTECTOMY  1994   LEFT HEART CATH AND CORONARY ANGIOGRAPHY N/A 07/08/2018    Procedure: LEFT HEART CATH AND CORONARY ANGIOGRAPHY;  Surgeon: Anner Alm ORN, MD;  Location: [facility] INVASIVE CV LAB;  Service: Cardiovascular;; Angiographically normal coronary arteries except ~20-25% prox & mid LAD.  Normal LVEDP.   PACEMAKER IMPLANT N/A 07/10/2018   Medtronic Azure XT DR MRI SureScan model [id number] (serial number [id number]) pacemaker by Dr Kelsie with His bundle lead   TEAR DUCT PROBING  1955   TONSILLECTOMY AND ADENOIDECTOMY  1968   TUBAL LIGATION  1982   UPPER GI ENDOSCOPY  06/28/2022   VAGINAL HYSTERECTOMY  1990   WISDOM TOOTH EXTRACTION  1967    Family History  Problem Relation Age of Onset   Breast cancer Mother        breast   Cancer Mother    Heart disease Father        h/o Rheumatic Fever - Rheumatic MV Dz.   Colon cancer Paternal Aunt 15 - 54   Tuberculosis Maternal Grandmother        died in her 33s   Throat cancer Maternal Grandfather    Pneumonia Paternal Grandmother    Allergies Son    Asthma Son    Esophageal cancer Neg Hx    Rectal cancer Neg Hx    Stomach cancer Neg Hx      Social Connections: Socially Integrated (10/19/2024)   Social Connection and Isolation Panel    Frequency of Communication with Friends and Family: More than three times a week    Frequency of Social Gatherings with Friends and Family: More than three times a week    Attends Religious Services: More than 4 times per year    Active Member of [employer] or Organizations: Yes    Attends Banker Meetings: 1 to 4 times per year    Marital Status: Married      Current Outpatient Medications:    acetaminophen  (TYLENOL ) 500 MG tablet, Take 1,000 mg by mouth every 6 (six) hours as needed (for pain.)., Disp: , Rfl:    ALPRAZolam  (XANAX ) 0.25 MG tablet, Take 1 tablet (0.25 mg total) by mouth 2 (two) times daily as needed for sleep or anxiety., Disp: 60 tablet, Rfl: 2   aspirin  EC 81 MG tablet, Take 81 mg by mouth daily., Disp: , Rfl:    cholecalciferol (VITAMIN D3) 25 MCG  (1000 UNIT) tablet, Take 1,000 Units by mouth daily., Disp: , Rfl:    dexamethasone 0.5 MG/5ML elixir, RINSE WITH 1 TEASPOONFUL FOR 2 MINUTES THEN SPIT OUT 4 TIMES A DAY, Disp: , Rfl:    dicyclomine  (BENTYL ) 10 MG capsule, Take 1 capsule (10 mg total) by mouth 3 (three) times daily as needed for spasms., Disp: 90 capsule, Rfl: 3   Famotidine (PEPCID AC PO), Take 1 tablet by mouth daily in the afternoon., Disp: , Rfl:    ipratropium (ATROVENT ) 0.06 % nasal spray, Place 2 sprays into the nose 3 (three) times daily., Disp: 15 mL, Rfl: 2   losartan  (COZAAR ) 25 MG tablet, Take 25 mg by mouth 2 (two) times daily., Disp: , Rfl:    metroNIDAZOLE  (METROCREAM ) 0.75 % cream, Apply 1 application topically daily as needed (  for rosacea)., Disp: 45 g, Rfl: 3   montelukast  (SINGULAIR ) 10 MG tablet, Take 1 tablet (10 mg total) by mouth daily., Disp: 90 tablet, Rfl: 3   nitroGLYCERIN  (NITROSTAT ) 0.4 MG SL tablet, Place 1 tablet (0.4 mg total) under the tongue every 5 (five) minutes as needed for chest pain., Disp: 25 tablet, Rfl: 5   omeprazole  (PRILOSEC) 40 MG capsule, Take 1 capsule (40 mg total) by mouth daily., Disp: 30 capsule, Rfl: 6   Simethicone (GAS-X PO), Take 1 tablet by mouth daily in the afternoon., Disp: , Rfl:    valACYclovir  (VALTREX ) 1000 MG tablet, Take 1 tablet (1,000 mg total) by mouth 3 (three) times daily., Disp: 21 tablet, Rfl: 0   vitamin E 1000 UNIT capsule, Take 1,000 Units by mouth daily., Disp: , Rfl:    XIIDRA 5 % SOLN, Two doses per tube - per eye DR, Disp: , Rfl:    atorvastatin  (LIPITOR) 20 MG tablet, Take 1 tablet (20 mg total) by mouth daily. (Patient not taking: Reported on 11/03/2024), Disp: 90 tablet, Rfl: 3   Physical Exam:   BP (!) 147/85 (BP Location: Left Arm, Patient Position: Sitting, Cuff Size: Large)   Pulse 91   Ht 5' 4 (1.626 m)   Wt 156 lb (70.8 kg)   SpO2 97%   BMI 26.78 kg/m   Salient findings:  CN II-XII intact Bilateral EAC clear and TM intact with well  pneumatized middle ear spaces Anterior rhinoscopy: Septum intact; bilateral inferior turbinates without significant hypertrophy No lesions of oral cavity/oropharynx; place that the patient points to from sore standpoint just appears to be a tongue papilla; palpable tongue base without abnormality No obviously palpable neck masses/lymphadenopathy/thyromegaly; area of tenderness appears to be over lateral part of left thyroid  cartilage, but no palpable abnormalities appreciated No respiratory distress or stridor; TFL was indicated to better evaluate the proximal airway, given the patient's history and exam findings, and is detailed below.  Seprately Identifiable Procedures:  Prior to initiating any procedures, risks/benefits/alternatives were explained to the patient and verbal consent obtained. Procedure Note Pre-procedure diagnosis: Persistent neck pain, rule out structural cause, GERD Post-procedure diagnosis: Same Procedure: Transnasal Fiberoptic Laryngoscopy, CPT 31575 - Mod 25 Indication: see above Complications: None apparent EBL: 0 mL  The procedure was undertaken to further evaluate the patient's complaint above, with mirror exam inadequate for appropriate examination due to gag reflex and poor patient tolerance  Procedure:  Patient was identified as correct patient. Verbal consent was obtained. The nose was sprayed with oxymetazoline and 4% lidocaine . The The flexible laryngoscope was passed through the nose to view the nasal cavity, pharynx (oropharynx, hypopharynx) and larynx.  The larynx was examined at rest and during multiple phonatory tasks. Documentation was obtained and reviewed with patient. The scope was removed. The patient tolerated the procedure well.  Findings: The nasal cavity and nasopharynx did not reveal any masses or lesions, mucosa appeared to be without obvious lesions. The tongue base, pharyngeal walls, piriform sinuses, vallecula, epiglottis and postcricoid region  are normal in appearance. The visualized portion of the subglottis and proximal trachea is widely patent. The vocal folds are mobile bilaterally. There are no lesions on the free edge of the vocal folds nor elsewhere in the larynx worrisome for malignancy.    Electronically signed by: Eldora KATHEE Blanch, MD 11/14/2024 1:28 PM   Impression & Plans:  Anna Ellis is a 73 y.o. female with:  1. Post-nasal drip   2. Neck discomfort   3.  Gastroesophageal reflux disease, unspecified whether esophagitis present    Appears to be most likely referred from neck given CT and TFL overall reassuring. Differential includes muscle tension, musculoskeletal issues, or reflux. Discussed options and we opted for observation. Consider repeat CT if sx persist. Advised her to d/w ortho regarding her neck findings Continue PPI as per GI for GERD; consider barrier agent  See below regarding exact medications prescribed this encounter including dosages and route: No orders of the defined types were placed in this encounter.     Thank you for allowing me the opportunity to care for your patient. Please do not hesitate to contact me should you have any other questions.  Sincerely, Eldora Blanch, MD Otolaryngologist (ENT), [facility] Phone: [phone] Fax: [phone]  11/14/2024, 1:28 PM   MDM:  I have personally spent 45 minutes involved in face-to-face and non-face-to-face activities for this patient on the day of the visit.  Professional time spent excludes any procedures performed but includes the following activities, in addition to those noted in the documentation: preparing to see the patient (review of outside documentation and results - multiple notes, including GI procedures), performing a medically appropriate examination, counseling, documenting in the electronic health record, independently interpreting results (CT).

## 2024-11-18 ENCOUNTER — Ambulatory Visit: Admitting: Family Medicine

## 2024-11-18 ENCOUNTER — Encounter: Payer: Self-pay | Admitting: Family Medicine

## 2024-11-18 VITALS — BP 124/76 | HR 95 | Temp 97.7°F | Ht 64.0 in | Wt 161.2 lb

## 2024-11-18 DIAGNOSIS — H938X1 Other specified disorders of right ear: Secondary | ICD-10-CM

## 2024-11-18 DIAGNOSIS — J019 Acute sinusitis, unspecified: Secondary | ICD-10-CM

## 2024-11-18 DIAGNOSIS — R0981 Nasal congestion: Secondary | ICD-10-CM

## 2024-11-18 LAB — POCT INFLUENZA A/B
Influenza A, POC: NEGATIVE
Influenza B, POC: NEGATIVE

## 2024-11-18 LAB — POC COVID19 BINAXNOW: SARS Coronavirus 2 Ag: NEGATIVE

## 2024-11-18 MED ORDER — AMOXICILLIN-POT CLAVULANATE 500-125 MG PO TABS: 1.0000 | ORAL_TABLET | Freq: Two times a day (BID) | ORAL | 0 refills | Status: DC

## 2024-11-18 MED ORDER — FLUTICASONE PROPIONATE 50 MCG/ACT NA SUSP: 1.0000 | Freq: Every day | NASAL | 0 refills | Status: AC

## 2024-11-18 NOTE — Progress Notes (Signed)
 Established Patient Office Visit   Subjective  Patient ID: Anna Ellis, female    DOB: 1951/07/14  Age: 73 y.o. MRN: [id number]  Chief Complaint  Patient presents with   Acute Visit    Patient is here for sinus pressure, headache, jaw and teeth hurt that began Sunday    Pt is a 73 yo female followed by Dr. Garald who was seen for acute concern.  Pt with HA, jaw pain, popping in ears, pain of R eye and face x 3 days.  States needs a white abx and prednisone .  Denies cough, st, sick contacts, fever.  Tried OTC cough/cold medication, antihistamine, Sudafed, Tylenol .  Taking Singulair .    Patient Active Problem List   Diagnosis Date Noted   Coronary artery disease, non-occlusive 10/12/2024   Mild mitral regurgitation 10/12/2024   Preoperative cardiovascular examination 10/09/2024   Myalgia 08/18/2024   Carotid stenosis, asymptomatic 08/18/2024   Polycythemia 08/18/2024   Neck pain 08/04/2024   Cervical adenopathy 08/04/2024   Diverticulitis 04/17/2024   Syncope and collapse 06/30/2023   RUQ pain 05/01/2022   Early satiety 05/01/2022   Hx of cholecystectomy 05/01/2022   Osteopenia 01/03/2022   Pacemaker 12/27/2021   HTN (hypertension) 02/23/2020   Allergy to food dye 06/23/2019   Sore in nose 06/23/2019   Second degree Mobitz II AV block 07/10/2018   Abnormal nuclear stress test 07/04/2018   Frequent PVCs 07/04/2018   Symptomatic bradycardia 07/02/2018   Chest tightness 07/01/2018   2nd degree AV block 07/01/2018   Tongue discoloration 11/19/2017   Throat pain in adult 09/03/2016   Chronic left SI joint pain 01/20/2015   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   Atypical facial pain 07/22/2013   Mouth ulcer 02/24/2013   Right ear pain 10/30/2012   Well adult exam 01/14/2012   Serum potassium elevated 01/14/2012   Cystitis 01/14/2012   Cerumen impaction 11/19/2011   Nausea 09/11/2011   Fibrocystic breast disease 08/06/2011   Family history of breast cancer 08/06/2011    Gastroesophageal reflux disease 07/11/2011   Asthma with hay fever 06/15/2011   Bilateral carotid bruits 01/08/2011   Headache 07/24/2010   Nonspecific (abnormal) findings on radiological and other examination of body structure 01/25/2010   ABNORMAL CHEST XRAY 01/25/2010   SHOULDER PAIN 01/06/2010   URTICARIA 12/27/2008   Allergic urticaria 12/15/2008   Asthma, cough variant 12/15/2008   ELEVATED BLOOD PRESSURE WITHOUT DIAGNOSIS OF HYPERTENSION 12/15/2008   Sinusitis 11/19/2007   Hyperlipidemia LDL goal <70 09/19/2007   ANEMIA-IRON DEFICIENCY 09/19/2007   Anxiety disorder 09/19/2007   Allergic rhinitis 09/19/2007   OSTEOARTHRITIS 09/19/2007   RASH-NONVESICULAR 09/19/2007   Past Medical History:  Diagnosis Date   ALLERGIC RHINITIS 09/19/2007   Allergy    ANEMIA-IRON DEFICIENCY 09/19/2007   ANXIETY 09/19/2007   Arthritis    neck (07/10/2018)   Basal cell cancer    back of my neck; scraped it off   CAROTID BRUIT 01/08/2011   Chronic headaches    GERD (gastroesophageal reflux disease)    High blood pressure    History of gallstones    Hyperlipidemia    Mobitz II    a. s/p MDT dual chamber (His Bundle) pacemaker 07/2018 - Dr Kelsie   OSTEOARTHRITIS 09/19/2007   Pneumonia    Past Surgical History:  Procedure Laterality Date   ANTERIOR CRUCIATE LIGAMENT REPAIR Left 2001   donor ACL   BREAST LUMPECTOMY Bilateral 2002   both benign   COLONOSCOPY  09/12/2017   Dr Luis.  have diverticulosis   ESOPHAGOGASTRODUODENOSCOPY  1994   INGUINAL HERNIA REPAIR Right 1971   KNEE CARTILAGE SURGERY Left 1986   w/ACL repair   LAPAROSCOPIC CHOLECYSTECTOMY  1994   LEFT HEART CATH AND CORONARY ANGIOGRAPHY N/A 07/08/2018   Procedure: LEFT HEART CATH AND CORONARY ANGIOGRAPHY;  Surgeon: Anner Alm ORN, MD;  Location: [facility] INVASIVE CV LAB;  Service: Cardiovascular;; Angiographically normal coronary arteries except ~20-25% prox & mid LAD.  Normal LVEDP.   PACEMAKER IMPLANT N/A  07/10/2018   Medtronic Azure XT DR MRI SureScan model [id number] (serial number [id number]) pacemaker by Dr Kelsie with His bundle lead   TEAR DUCT PROBING  1955   TONSILLECTOMY AND ADENOIDECTOMY  1968   TUBAL LIGATION  1982   UPPER GI ENDOSCOPY  06/28/2022   VAGINAL HYSTERECTOMY  1990   WISDOM TOOTH EXTRACTION  1967   Social History   Tobacco Use   Smoking status: Never   Smokeless tobacco: Never  Vaping Use   Vaping status: Never Used  Substance Use Topics   Alcohol use: Never   Drug use: Never   Family History  Problem Relation Age of Onset   Breast cancer Mother        breast   Cancer Mother    Heart disease Father        h/o Rheumatic Fever - Rheumatic MV Dz.   Colon cancer Paternal Aunt 88 - 89   Tuberculosis Maternal Grandmother        died in her 21s   Throat cancer Maternal Grandfather    Pneumonia Paternal Grandmother    Allergies Son    Asthma Son    Esophageal cancer Neg Hx    Rectal cancer Neg Hx    Stomach cancer Neg Hx    Allergies  Allergen Reactions   Clarithromycin Hives   Cobalt Rash    Found on Allergy Test   Etodolac  Other (See Comments)    Gastritis    Food Color Red Hives   Parabens Rash   Sulfonamide Derivatives Hives    ROS Negative unless stated above    Objective:     BP 124/76 (BP Location: Left Arm, Patient Position: Sitting, Cuff Size: Large)   Pulse 95   Temp 97.7 F (36.5 C) (Oral)   Ht 5' 4 (1.626 m)   Wt 161 lb 3.2 oz (73.1 kg)   SpO2 98%   BMI 27.67 kg/m  BP Readings from Last 3 Encounters:  11/18/24 124/76  11/03/24 (!) 147/85  10/20/24 (!) 156/88   Wt Readings from Last 3 Encounters:  11/18/24 161 lb 3.2 oz (73.1 kg)  11/03/24 156 lb (70.8 kg)  10/20/24 158 lb (71.7 kg)      Physical Exam Constitutional:      General: She is not in acute distress.    Appearance: Normal appearance.  HENT:     Head: Normocephalic and atraumatic.     Right Ear: Ear canal and external ear normal. Tympanic membrane is  not erythematous.     Left Ear: Ear canal and external ear normal. Tympanic membrane is not erythematous.     Ears:     Comments: B/l TMs full.    Nose: Nose normal.     Mouth/Throat:     Mouth: Mucous membranes are moist.  Cardiovascular:     Rate and Rhythm: Normal rate and regular rhythm.     Heart sounds: Normal heart sounds. No murmur heard.    No gallop.  Pulmonary:  Effort: Pulmonary effort is normal. No respiratory distress.     Breath sounds: Normal breath sounds. No wheezing, rhonchi or rales.  Skin:    General: Skin is warm and dry.  Neurological:     Mental Status: She is alert and oriented to person, place, and time.        09/05/2023   10:19 AM 06/11/2023    2:11 PM 06/03/2023   11:22 AM  Depression screen PHQ 2/9  Decreased Interest 0 0 0  Down, Depressed, Hopeless 0 0 0  PHQ - 2 Score 0 0 0      01/07/2023   10:31 AM  GAD 7 : Generalized Anxiety Score  Nervous, Anxious, on Edge 0  Control/stop worrying 0  Worry too much - different things 0  Trouble relaxing 0  Restless 0  Easily annoyed or irritable 0  Afraid - awful might happen 0  Total GAD 7 Score 0  Anxiety Difficulty Not difficult at all     Results for orders placed or performed in visit on 11/18/24  POC COVID-19 BinaxNow  Result Value Ref Range   SARS Coronavirus 2 Ag Negative Negative  POC Influenza A/B  Result Value Ref Range   Influenza A, POC Negative Negative   Influenza B, POC Negative Negative      Assessment & Plan:   Acute rhinosinusitis -     Amoxicillin -Pot Clavulanate; Take 1 tablet by mouth in the morning and at bedtime for 7 days.  Dispense: 14 tablet; Refill: 0 -     Fluticasone Propionate; Place 1 spray into both nostrils daily.  Dispense: 16 g; Refill: 0  Sinus congestion -     POC COVID-19 BinaxNow -     POCT Influenza A/B -     Fluticasone Propionate; Place 1 spray into both nostrils daily.  Dispense: 16 g; Refill: 0  Popping of right ear -     Fluticasone  Propionate; Place 1 spray into both nostrils daily.  Dispense: 16 g; Refill: 0  Acute URI/sinus sx.  PO COVID and flu testing negative.  Advised likely viral in etiology given duration.  Discussed supportive care with OTC cough/cold meds, heat, steam from shower, etc.  Given rx for flonase.  Wait an seen rx for abx sent to pharmacy, can pick up on Friday for continued sx.  PO steroid not indicated.  F/u with pcp.  Return if symptoms worsen or fail to improve.   Clotilda JONELLE Single, MD

## 2024-11-26 ENCOUNTER — Ambulatory Visit: Admitting: Internal Medicine

## 2024-11-26 ENCOUNTER — Encounter: Payer: Self-pay | Admitting: Internal Medicine

## 2024-11-26 VITALS — BP 132/80 | HR 68 | Temp 97.8°F | Ht 64.0 in

## 2024-11-26 DIAGNOSIS — K121 Other forms of stomatitis: Secondary | ICD-10-CM | POA: Diagnosis not present

## 2024-11-26 DIAGNOSIS — D751 Secondary polycythemia: Secondary | ICD-10-CM

## 2024-11-26 DIAGNOSIS — I10 Essential (primary) hypertension: Secondary | ICD-10-CM | POA: Diagnosis not present

## 2024-11-26 DIAGNOSIS — J0140 Acute pansinusitis, unspecified: Secondary | ICD-10-CM

## 2024-11-26 MED ORDER — CEFUROXIME AXETIL 500 MG PO TABS: 500.0000 mg | ORAL_TABLET | Freq: Two times a day (BID) | ORAL | 1 refills | Status: DC

## 2024-11-26 MED ORDER — VALACYCLOVIR HCL 1 G PO TABS: 1000.0000 mg | ORAL_TABLET | Freq: Three times a day (TID) | ORAL | 0 refills | Status: AC

## 2024-11-26 MED ORDER — PREDNISONE 10 MG PO TABS: ORAL_TABLET | ORAL | 1 refills | Status: DC

## 2024-11-26 NOTE — Progress Notes (Signed)
 Subjective:  Patient ID: Anna Ellis, female    DOB: Feb 27, 1951  Age: 73 y.o. MRN: [id number]  CC: Sinusitis (Sinus infection. Seen by [facility] for this issue last week. Treated with augmentin . Symptoms better but still present)   HPI Anna Ellis presents for sinusitis x2 weeks. Dr Mercer treated w/Augmentin  x7 d. Not better  Outpatient Medications Prior to Visit  Medication Sig Dispense Refill   acetaminophen  (TYLENOL ) 500 MG tablet Take 1,000 mg by mouth every 6 (six) hours as needed (for pain.).     ALPRAZolam  (XANAX ) 0.25 MG tablet Take 1 tablet (0.25 mg total) by mouth 2 (two) times daily as needed for sleep or anxiety. 60 tablet 2   aspirin  EC 81 MG tablet Take 81 mg by mouth daily.     cholecalciferol (VITAMIN D3) 25 MCG (1000 UNIT) tablet Take 1,000 Units by mouth daily.     Famotidine (PEPCID AC PO) Take 1 tablet by mouth daily in the afternoon.     fluticasone  (FLONASE ) 50 MCG/ACT nasal spray Place 1 spray into both nostrils daily. 16 g 0   ipratropium (ATROVENT ) 0.06 % nasal spray Place 2 sprays into the nose 3 (three) times daily. 15 mL 2   losartan  (COZAAR ) 25 MG tablet Take 25 mg by mouth 2 (two) times daily.     metroNIDAZOLE  (METROCREAM ) 0.75 % cream Apply 1 application topically daily as needed (for rosacea). 45 g 3   montelukast  (SINGULAIR ) 10 MG tablet Take 1 tablet (10 mg total) by mouth daily. 90 tablet 3   omeprazole  (PRILOSEC) 40 MG capsule Take 1 capsule (40 mg total) by mouth daily. (Patient taking differently: Take 20 mg by mouth daily.) 30 capsule 6   Simethicone (GAS-X PO) Take 1 tablet by mouth daily in the afternoon.     vitamin E 1000 UNIT capsule Take 1,000 Units by mouth daily.     XIIDRA 5 % SOLN Two doses per tube - per eye DR     amoxicillin -clavulanate (AUGMENTIN ) 500-125 MG tablet Take 1 tablet by mouth in the morning and at bedtime for 7 days. 14 tablet 0   valACYclovir  (VALTREX ) 1000 MG tablet Take 1 tablet (1,000 mg total) by mouth 3 (three)  times daily. 21 tablet 0   atorvastatin  (LIPITOR) 20 MG tablet Take 1 tablet (20 mg total) by mouth daily. (Patient not taking: Reported on 11/26/2024) 90 tablet 3   dicyclomine  (BENTYL ) 10 MG capsule Take 1 capsule (10 mg total) by mouth 3 (three) times daily as needed for spasms. (Patient not taking: Reported on 11/26/2024) 90 capsule 3   nitroGLYCERIN  (NITROSTAT ) 0.4 MG SL tablet Place 1 tablet (0.4 mg total) under the tongue every 5 (five) minutes as needed for chest pain. (Patient not taking: Reported on 11/26/2024) 25 tablet 5   dexamethasone 0.5 MG/5ML elixir RINSE WITH 1 TEASPOONFUL FOR 2 MINUTES THEN SPIT OUT 4 TIMES A DAY     No facility-administered medications prior to visit.    ROS: Review of Systems  Constitutional:  Negative for activity change, appetite change, chills, fatigue and unexpected weight change.  HENT:  Positive for postnasal drip, rhinorrhea, sinus pressure, sinus pain and sore throat. Negative for congestion and mouth sores.   Eyes:  Negative for visual disturbance.  Respiratory:  Negative for cough and chest tightness.   Gastrointestinal:  Negative for abdominal pain and nausea.  Genitourinary:  Negative for difficulty urinating, frequency and vaginal pain.  Musculoskeletal:  Negative for back pain and gait  problem.  Skin:  Negative for pallor and rash.  Neurological:  Negative for dizziness, tremors, weakness, numbness and headaches.  Psychiatric/Behavioral:  Negative for confusion and sleep disturbance.     Objective:  BP 132/80   Pulse 68   Temp 97.8 F (36.6 C)   Ht 5' 4 (1.626 m)   SpO2 97%   BMI 27.67 kg/m   BP Readings from Last 3 Encounters:  11/26/24 132/80  11/18/24 124/76  11/03/24 (!) 147/85    Wt Readings from Last 3 Encounters:  11/18/24 161 lb 3.2 oz (73.1 kg)  11/03/24 156 lb (70.8 kg)  10/20/24 158 lb (71.7 kg)    Physical Exam Constitutional:      General: She is not in acute distress.    Appearance: She is  well-developed.  HENT:     Head: Normocephalic.     Right Ear: External ear normal.     Left Ear: External ear normal.     Nose: Nose normal.  Eyes:     General:        Right eye: No discharge.        Left eye: No discharge.     Conjunctiva/sclera: Conjunctivae normal.     Pupils: Pupils are equal, round, and reactive to light.  Neck:     Thyroid : No thyromegaly.     Vascular: No JVD.     Trachea: No tracheal deviation.  Cardiovascular:     Rate and Rhythm: Normal rate and regular rhythm.     Heart sounds: Normal heart sounds.  Pulmonary:     Effort: No respiratory distress.     Breath sounds: No stridor. No wheezing.  Abdominal:     General: Bowel sounds are normal. There is no distension.     Palpations: Abdomen is soft. There is no mass.     Tenderness: There is no abdominal tenderness. There is no guarding or rebound.  Musculoskeletal:        General: No tenderness.     Cervical back: Normal range of motion and neck supple. No rigidity.  Lymphadenopathy:     Cervical: No cervical adenopathy.  Skin:    Findings: No erythema or rash.  Neurological:     Cranial Nerves: No cranial nerve deficit.     Motor: No abnormal muscle tone.     Coordination: Coordination normal.     Deep Tendon Reflexes: Reflexes normal.  Psychiatric:        Behavior: Behavior normal.        Thought Content: Thought content normal.        Judgment: Judgment normal.   Sore in the R nostril  Lab Results  Component Value Date   WBC 4.3 10/14/2024   HGB 13.7 10/14/2024   HCT 40.7 10/14/2024   PLT 307.0 10/14/2024   GLUCOSE 93 08/04/2024   CHOL 162 01/09/2024   TRIG 53.0 01/09/2024   HDL 69.50 01/09/2024   LDLCALC 82 01/09/2024   ALT 19 08/04/2024   AST 18 08/04/2024   NA 142 08/04/2024   K 4.3 08/04/2024   CL 102 08/04/2024   CREATININE 0.74 08/04/2024   BUN 17 08/04/2024   CO2 29 08/04/2024   TSH 1.62 04/24/2024    NM PET CT CARDIAC PERFUSION MULTI W/ABSOLUTE BLOODFLOW Result  Date: 09/04/2024   The study is normal. The study is low risk.   LV perfusion is normal. There is no evidence of ischemia. There is no evidence of infarction. Decreased perfusion in a small  area of the apex with normal wall motion suggests artifact.   Rest left ventricular function is normal. Rest EF: 56%. Stress left ventricular function is normal. Stress EF: 67%. End diastolic cavity size is normal. End systolic cavity size is normal. No evidence of transient ischemic dilation (TID) noted.   Myocardial blood flow was computed to be 0.91ml/g/min at rest and 2.52ml/g/min at stress. Global myocardial blood flow reserve was 2.77 and was normal.   Coronary calcium  was present on the attenuation correction CT images. Mild coronary calcifications were present. Coronary calcifications were present in the left anterior descending artery distribution(s).   Electronically signed by: Soyla DELENA Merck, MD CLINICAL DATA:  This over-read does not include interpretation of cardiac or coronary anatomy or pathology. The Cardiac PET CT interpretation by the cardiologist is attached. COMPARISON:  None available FINDINGS: Vascular: Left chest wall pacer with leads in the right atrial appendage and right ventricle. Coronary artery calcifications noted. Mediastinum/Nodes: No mediastinal mass or adenopathy identified. Lungs/Pleura: Mosaic attenuation pattern noted which may reflect underlying small airways disease. No pleural fluid, interstitial edema or pneumothorax. Upper Abdomen: No acute abnormality within the imaged portions of the upper abdomen. Cholecystectomy. Unchanged too small to characterize low-density structure in lateral segment of left lobe of liver measuring 6 mm, image 66/3. Musculoskeletal: No acute osseous findings IMPRESSION: 1. Mosaic attenuation pattern within the lungs noted, which may reflect underlying small airways disease. 2. Coronary artery calcifications noted. Electronically Signed   By: Waddell Calk  M.D.   On: 09/04/2024 08:14   Assessment & Plan:   Problem List Items Addressed This Visit     HTN (hypertension) (Chronic)   BP is nl at home      Mouth ulcer - Primary   ?H simplex Would use Valtrex  prn      Polycythemia   Monitor CBC      Sinusitis   Recurrent - worse Ceftin  and prednisone  taper usually helps Prednisone  10 mg: take 4 tabs a day x 3 days; then 3 tabs a day x 4 days; then 2 tabs a day x 4 days, then 1 tab a day x 6 days, then stop. Take pc.      Relevant Medications   valACYclovir  (VALTREX ) 1000 MG tablet   cefUROXime  (CEFTIN ) 500 MG tablet   predniSONE  (DELTASONE ) 10 MG tablet      Meds ordered this encounter  Medications   valACYclovir  (VALTREX ) 1000 MG tablet    Sig: Take 1 tablet (1,000 mg total) by mouth 3 (three) times daily.    Dispense:  21 tablet    Refill:  0   cefUROXime  (CEFTIN ) 500 MG tablet    Sig: Take 1 tablet (500 mg total) by mouth 2 (two) times daily with a meal.    Dispense:  20 tablet    Refill:  1   predniSONE  (DELTASONE ) 10 MG tablet    Sig: Prednisone  10 mg: take 4 tabs a day x 3 days; then 3 tabs a day x 4 days; then 2 tabs a day x 4 days, then 1 tab a day x 6 days, then stop. Take pc.    Dispense:  38 tablet    Refill:  1   mupirocin  ointment (BACTROBAN ) 2 %    Sig: On leg wound w/dressing change qd or bid    Dispense:  30 g    Refill:  0      Follow-up: Return in about 3 months (around 02/24/2025) for a follow-up visit.  Marolyn Noel, MD

## 2024-11-26 NOTE — Assessment & Plan Note (Signed)
BP is nl at home 

## 2024-11-26 NOTE — Assessment & Plan Note (Signed)
?  H simplex Would use Valtrex  prn

## 2024-11-26 NOTE — Assessment & Plan Note (Signed)
 Recurrent - worse Ceftin  and prednisone  taper usually helps Prednisone  10 mg: take 4 tabs a day x 3 days; then 3 tabs a day x 4 days; then 2 tabs a day x 4 days, then 1 tab a day x 6 days, then stop. Take pc.

## 2024-11-26 NOTE — Patient Instructions (Signed)

## 2024-11-26 NOTE — Assessment & Plan Note (Signed)
 Monitor CBC

## 2024-11-27 ENCOUNTER — Encounter: Payer: Self-pay | Admitting: Internal Medicine

## 2024-12-17 ENCOUNTER — Ambulatory Visit: Admitting: Cardiovascular Disease

## 2024-12-17 ENCOUNTER — Encounter (HOSPITAL_COMMUNITY): Payer: Self-pay

## 2024-12-17 NOTE — Patient Instructions (Signed)
 SURGICAL WAITING ROOM VISITATION  Patients having surgery or a procedure may have no more than 2 support people in the waiting area - these visitors may rotate.    Children ages 53 and under will not be able to visit patients in [facility] under most circumstances.   Visitors with respiratory illnesses are discouraged from visiting and should remain at home.  If the patient needs to stay at the hospital during part of their recovery, the visitor guidelines for inpatient rooms apply. Pre-op  nurse will coordinate an appropriate time for 1 support person to accompany patient in pre-op .  This support person may not rotate.    Please refer to the [facility] website for the visitor guidelines for Inpatients (after your surgery is over and you are in a regular room).       Your procedure is scheduled on:  12/29/2024    Report to [facility] Main Entrance    Report to admitting at  1030 AM   Call this number if you have problems the morning of surgery [phone]   Do not eat food :After Midnight.   After Midnight you may have the following liquids until  0945______ AM/ PM DAY OF SURGERY  Water Non-Citrus Juices (without pulp, NO RED-Apple, White grape, White cranberry) Black Coffee (NO MILK/CREAM OR CREAMERS, sugar ok)  Clear Tea (NO MILK/CREAM OR CREAMERS, sugar ok) regular and decaf                             Plain Jell-O (NO RED)                                           Fruit ices (not with fruit pulp, NO RED)                                     Popsicles (NO RED)                                                               Sports drinks like Gatorade (NO RED)                     The day of surgery:  Drink ONE (1) Pre-Surgery Clear Ensure or G2 at 0945 AM the morning of surgery. Drink in one sitting. Do not sip.  This drink was given to you during your hospital  pre-op  appointment visit. Nothing else to drink after completing the  Pre-Surgery Clear  Ensure or G2.          If you have questions, please contact your surgeons office.       Oral Hygiene is also important to reduce your risk of infection.                                    Remember - BRUSH YOUR TEETH THE MORNING OF SURGERY WITH YOUR REGULAR TOOTHPASTE  DENTURES WILL BE REMOVED PRIOR TO SURGERY PLEASE DO NOT  APPLY Poly grip OR ADHESIVES!!!   Do NOT smoke after Midnight   Stop all vitamins and herbal supplements 7 days before surgery.   Take these medicines the morning of surgery with A SIP OF WATER:  flonae, singulair , omeprazole    DO NOT TAKE ANY ORAL DIABETIC MEDICATIONS DAY OF YOUR SURGERY  Bring CPAP mask and tubing day of surgery.                              You may not have any metal on your body including hair pins, jewelry, and body piercing             Do not wear make-up, lotions, powders, perfumes/cologne, or deodorant  Do not wear nail polish including gel and S&S, artificial/acrylic nails, or any other type of covering on natural nails including finger and toenails. If you have artificial nails, gel coating, etc. that needs to be removed by a nail salon please have this removed prior to surgery or surgery may need to be canceled/ delayed if the surgeon/ anesthesia feels like they are unable to be safely monitored.   Do not shave  48 hours prior to surgery.               Men may shave face and neck.   Do not bring valuables to the hospital. [facility] IS NOT             RESPONSIBLE   FOR VALUABLES.   Contacts, glasses, dentures or bridgework may not be worn into surgery.   Bring small overnight bag day of surgery.   DO NOT BRING YOUR HOME MEDICATIONS TO THE HOSPITAL. PHARMACY WILL DISPENSE MEDICATIONS LISTED ON YOUR MEDICATION LIST TO YOU DURING YOUR ADMISSION IN THE HOSPITAL!    Patients discharged on the day of surgery will not be allowed to drive home.  Someone NEEDS to stay with you for the first 24 hours after anesthesia.   Special  Instructions: Bring a copy of your healthcare power of attorney and living will documents the day of surgery if you haven't scanned them before.              Please read over the following fact sheets you were given: IF YOU HAVE QUESTIONS ABOUT YOUR PRE-OP  INSTRUCTIONS PLEASE CALL [phone].   If you received a COVID test during your pre-op  visit  it is requested that you wear a mask when out in public, stay away from anyone that may not be feeling well and notify your surgeon if you develop symptoms. If you test positive for Covid or have been in contact with anyone that has tested positive in the last 10 days please notify you surgeon.      Pre-operative 4 CHG Bath Instructions   You can play a key role in reducing the risk of infection after surgery. Your skin needs to be as free of germs as possible. You can reduce the number of germs on your skin by washing with CHG (chlorhexidine  gluconate) soap before surgery. CHG is an antiseptic soap that kills germs and continues to kill germs even after washing.   DO NOT use if you have an allergy to chlorhexidine /CHG or antibacterial soaps. If your skin becomes reddened or irritated, stop using the CHG and notify one of our RNs at [phone].   Please shower with the CHG soap starting 4 days before surgery using the following schedule:  Please keep in mind the following:  DO NOT shave, including legs and underarms, starting the day of your first shower.   You may shave your face at any point before/day of surgery.  Place clean sheets on your bed the day you start using CHG soap. Use a clean washcloth (not used since being washed) for each shower. DO NOT sleep with pets once you start using the CHG.   CHG Shower Instructions:  If you choose to wash your hair and private area, wash first with your normal shampoo/soap.  After you use shampoo/soap, rinse your hair and body thoroughly to remove shampoo/soap residue.  Turn the water OFF and  apply about 3 tablespoons (45 ml) of CHG soap to a CLEAN washcloth.  Apply CHG soap ONLY FROM YOUR NECK DOWN TO YOUR TOES (washing for 3-5 minutes)  DO NOT use CHG soap on face, private areas, open wounds, or sores.  Pay special attention to the area where your surgery is being performed.  If you are having back surgery, having someone wash your back for you may be helpful. Wait 2 minutes after CHG soap is applied, then you may rinse off the CHG soap.  Pat dry with a clean towel  Put on clean clothes/pajamas   If you choose to wear lotion, please use ONLY the CHG-compatible lotions on the back of this paper.     Additional instructions for the day of surgery: DO NOT APPLY any lotions, deodorants, cologne, or perfumes.   Put on clean/comfortable clothes.  Brush your teeth.  Ask your nurse before applying any prescription medications to the skin.      CHG Compatible Lotions   Aveeno Moisturizing lotion  Cetaphil Moisturizing Cream  Cetaphil Moisturizing Lotion  Clairol Herbal Essence Moisturizing Lotion, Dry Skin  Clairol Herbal Essence Moisturizing Lotion, Extra Dry Skin  Clairol Herbal Essence Moisturizing Lotion, Normal Skin  Curel Age Defying Therapeutic Moisturizing Lotion with Alpha Hydroxy  Curel Extreme Care Body Lotion  Curel Soothing Hands Moisturizing Hand Lotion  Curel Therapeutic Moisturizing Cream, Fragrance-Free  Curel Therapeutic Moisturizing Lotion, Fragrance-Free  Curel Therapeutic Moisturizing Lotion, Original Formula  Eucerin Daily Replenishing Lotion  Eucerin Dry Skin Therapy Plus Alpha Hydroxy Crme  Eucerin Dry Skin Therapy Plus Alpha Hydroxy Lotion  Eucerin Original Crme  Eucerin Original Lotion  Eucerin Plus Crme Eucerin Plus Lotion  Eucerin TriLipid Replenishing Lotion  Keri Anti-Bacterial Hand Lotion  Keri Deep Conditioning Original Lotion Dry Skin Formula Softly Scented  Keri Deep Conditioning Original Lotion, Fragrance Free Sensitive Skin  Formula  Keri Lotion Fast Absorbing Fragrance Free Sensitive Skin Formula  Keri Lotion Fast Absorbing Softly Scented Dry Skin Formula  Keri Original Lotion  Keri Skin Renewal Lotion Keri Silky Smooth Lotion  Keri Silky Smooth Sensitive Skin Lotion  Nivea Body Creamy Conditioning Oil  Nivea Body Extra Enriched Teacher, Adult Education Moisturizing Lotion Nivea Crme  Nivea Skin Firming Lotion  NutraDerm 30 Skin Lotion  NutraDerm Skin Lotion  NutraDerm Therapeutic Skin Cream  NutraDerm Therapeutic Skin Lotion  ProShield Protective Hand Cream  Provon moisturizing lotion

## 2024-12-17 NOTE — Progress Notes (Addendum)
 Anesthesia Review:  PCP: Plotnikov- LOV 11/26/24 Clearance in media Tabe clearance dated 09/20/24  Cardiologist : harding LOV 10/09/24 preop clearance  EP- DR Mealor  LOV 01/01/23   PPM/ ICD:  Pacemaker - located left anterior chest  Device Orders:ORders requested on 12/22/24  Device orders on chart on 12/22/24.   Device Check- 10/18/24  Rep Notified:  Chest x-ray : EKG : 07/02/24  Echo : 08/07/24  Stress test: 09/04/24  Cardiac Cath :   Activity level: can do a flight of stairs without difficutly  Sleep Study/ CPAP : none  Fasting Blood Sugar :      / Checks Blood Sugar -- times a day:    Blood Thinner/ Instructions /Last Dose: ASA / Instructions/ Last Dose :    81 mg aspirin     PT has a Red Dye Allergy in some things but not in others.  PT is aware to do a test area with the hibiclens  prior to starting the showers.  IF any issues pt is aware to use Dial antibacterial soap.

## 2024-12-22 ENCOUNTER — Other Ambulatory Visit: Payer: Self-pay

## 2024-12-22 ENCOUNTER — Encounter (HOSPITAL_COMMUNITY)
Admission: RE | Admit: 2024-12-22 | Discharge: 2024-12-22 | Disposition: A | Source: Ambulatory Visit | Attending: Orthopedic Surgery

## 2024-12-22 ENCOUNTER — Encounter: Payer: Self-pay | Admitting: Cardiovascular Disease

## 2024-12-22 ENCOUNTER — Encounter (HOSPITAL_COMMUNITY): Payer: Self-pay

## 2024-12-22 VITALS — BP 158/72 | HR 71 | Temp 98.2°F | Resp 16 | Ht 64.0 in | Wt 160.9 lb

## 2024-12-22 DIAGNOSIS — Z950 Presence of cardiac pacemaker: Secondary | ICD-10-CM | POA: Diagnosis not present

## 2024-12-22 DIAGNOSIS — M1712 Unilateral primary osteoarthritis, left knee: Secondary | ICD-10-CM | POA: Insufficient documentation

## 2024-12-22 DIAGNOSIS — Z01818 Encounter for other preprocedural examination: Secondary | ICD-10-CM | POA: Diagnosis present

## 2024-12-22 DIAGNOSIS — K219 Gastro-esophageal reflux disease without esophagitis: Secondary | ICD-10-CM | POA: Insufficient documentation

## 2024-12-22 DIAGNOSIS — I441 Atrioventricular block, second degree: Secondary | ICD-10-CM | POA: Insufficient documentation

## 2024-12-22 DIAGNOSIS — Z01812 Encounter for preprocedural laboratory examination: Secondary | ICD-10-CM | POA: Diagnosis not present

## 2024-12-22 DIAGNOSIS — I2510 Atherosclerotic heart disease of native coronary artery without angina pectoris: Secondary | ICD-10-CM | POA: Insufficient documentation

## 2024-12-22 DIAGNOSIS — I340 Nonrheumatic mitral (valve) insufficiency: Secondary | ICD-10-CM | POA: Insufficient documentation

## 2024-12-22 DIAGNOSIS — I10 Essential (primary) hypertension: Secondary | ICD-10-CM | POA: Diagnosis not present

## 2024-12-22 HISTORY — DX: Other specified postprocedural states: R11.2

## 2024-12-22 LAB — BASIC METABOLIC PANEL WITH GFR
Anion gap: 9 (ref 5–15)
BUN: 14 mg/dL (ref 8–23)
CO2: 26 mmol/L (ref 22–32)
Calcium: 9.9 mg/dL (ref 8.9–10.3)
Chloride: 103 mmol/L (ref 98–111)
Creatinine, Ser: 0.72 mg/dL (ref 0.44–1.00)
GFR, Estimated: >60 mL/min
Glucose, Bld: 95 mg/dL (ref 70–99)
Potassium: 4.2 mmol/L (ref 3.5–5.1)
Sodium: 138 mmol/L (ref 135–145)

## 2024-12-22 LAB — SURGICAL PCR SCREEN
MRSA, PCR: NEGATIVE
Staphylococcus aureus: NEGATIVE

## 2024-12-22 LAB — CBC
HCT: 43.2 % (ref 36.0–46.0)
Hemoglobin: 14.1 g/dL (ref 12.0–15.0)
MCH: 32.1 pg (ref 26.0–34.0)
MCHC: 32.6 g/dL (ref 30.0–36.0)
MCV: 98.4 fL (ref 80.0–100.0)
Platelets: 297 K/uL (ref 150–400)
RBC: 4.39 MIL/uL (ref 3.87–5.11)
RDW: 13.3 % (ref 11.5–15.5)
WBC: 6 K/uL (ref 4.0–10.5)
nRBC: 0 % (ref 0.0–0.2)

## 2024-12-22 NOTE — Progress Notes (Signed)
 PERIOPERATIVE PRESCRIPTION FOR IMPLANTED CARDIAC DEVICE PROGRAMMING  Patient Information: Name:  Anna Ellis  DOB:  August 13, 1951  MRN:  [id number]    Planned Procedure:  left total knee arthroplasty  Surgeon:  Dr  Donnice Car  Date of Procedure:   12/29/2024  Cautery will be used.  Position during surgery:   Please send documentation back to:  Darryle Law (Fax # [phone])  Device Information:  Clinic EP Physician:  Dr. Eulas Furbish   Device Type:  Pacemaker Manufacturer and Phone #:  Medtronic: [phone] Pacemaker Dependent?:  Yes.   Date of Last Device Check:  10/14/2024  Normal Device Function?:  Yes.    Electrophysiologist's Recommendations:  Have magnet available. Provide continuous ECG monitoring when magnet is used or reprogramming is to be performed.  Procedure should not interfere with device function.  No device programming or magnet placement needed.  Per Device Clinic Standing Orders, Almarie ONEIDA Shutter, RN  10:50 AM 12/22/2024

## 2024-12-23 NOTE — Progress Notes (Signed)
 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 Last seen in clinic by Dr. Anner on 10/09/24. She had chest pain in the summer of 2025 and underwent an echo and stress test which came back grossly normal. She denied any further pain and was cleared for surgery:  Preoperative cardiovascular examination - Primary Low cardiovascular risk for surgery. Normal stress test and echocardiogram with mild mitral regurgitation. - Proceed with planned left knee replacement surgery on January 20th. - Monitor blood pressure closely before surgery. [name]'s perioperative risk of a major cardiac event is 0.4% according to the Revised Cardiac Risk Index (RCRI).  Therefore, she is at low risk for perioperative complications.   Her functional capacity is good at 7.25 METs according to the [facility] Activity Status Index (DASI).  Device orders:  Device Information:   Clinic EP Physician:  Dr. Eulas Furbish    Device Type:  Pacemaker Manufacturer and Phone #:  Medtronic: [phone] Pacemaker Dependent?:  Yes.   Date of Last Device Check:  10/14/2024      Normal Device Function?:  Yes.     Electrophysiologist's Recommendations:   Have magnet available. Provide continuous ECG monitoring when magnet is used or reprogramming is to be performed.  Procedure should not interfere with device function.  No device programming or magnet placement needed.    VS: BP (!) 158/72   Pulse 71   Temp 36.8 C (Oral)   Resp 16    Ht 5' 4 (1.626 m)   Wt 73 kg   SpO2 100%   BMI 27.62 kg/m   PROVIDERS: Plotnikov, Karlynn GAILS, MD   LABS: Labs reviewed: Acceptable for surgery. (all labs ordered are listed, but only abnormal results are displayed)  Labs Reviewed  SURGICAL PCR SCREEN  CBC  BASIC METABOLIC PANEL WITH GFR     CT Chest overread 09/03/24:  IMPRESSION: 1. Mosaic attenuation pattern within the lungs noted, which may reflect underlying small airways disease. 2. Coronary artery calcifications noted.    EKG 07/02/24:  Atrial sensed ventricular paced rhythm with prolonged AV conduction   NM PET/CT cardiac perfusion 09/03/2024:  Narrative & Impression     The study is normal. The study is low risk.   LV perfusion is normal. There is no evidence of ischemia. There is no evidence of infarction. Decreased perfusion in a small area of the apex with normal wall motion suggests artifact.   Rest left ventricular function is normal. Rest EF: 56%. Stress left ventricular function is normal. Stress EF: 67%. End diastolic cavity size is normal. End systolic cavity size is normal. No evidence of transient ischemic dilation (TID) noted.   Myocardial blood flow was computed to be 0.91ml/g/min at rest and 2.52ml/g/min at stress. Global myocardial blood flow reserve was 2.77 and was normal.   Coronary calcium  was present on the attenuation correction CT images. Mild coronary calcifications were present. Coronary calcifications were present in the left anterior descending artery distribution(s).  Echo  08/07/2024:  IMPRESSIONS    1. Left ventricular ejection fraction, by estimation, is 55 to 60%. The left ventricle has normal function.  2. Right ventricular systolic function is low normal. The right ventricular size is normal.  3. The mitral valve is normal in structure. Mild mitral valve regurgitation.  4. The aortic valve is tricuspid. Aortic valve regurgitation is not visualized.  5. The inferior vena cava  is normal in size with greater than 50% respiratory variability, suggesting right atrial pressure of 3 mmHg.  Past Medical History:  Diagnosis Date   ALLERGIC RHINITIS 09/19/2007   Allergy    ANEMIA-IRON DEFICIENCY 09/19/2007   Arthritis    neck (07/10/2018)   Basal cell cancer    back of my neck; scraped it off   CAROTID BRUIT 01/08/2011   GERD (gastroesophageal reflux disease)    High blood pressure    History of gallstones    Hyperlipidemia    Mobitz II    a. s/p MDT dual chamber (His Bundle) pacemaker 07/2018 - Dr Kelsie   OSTEOARTHRITIS 09/19/2007   Pneumonia    PONV (postoperative nausea and vomiting)    Presence of permanent cardiac pacemaker     Past Surgical History:  Procedure Laterality Date   ANTERIOR CRUCIATE LIGAMENT REPAIR Left 2001   donor ACL   BREAST LUMPECTOMY Bilateral 2002   both benign   COLONOSCOPY  09/12/2017   Dr Luis. have diverticulosis   ESOPHAGOGASTRODUODENOSCOPY  1994   INGUINAL HERNIA REPAIR Right 1971   KNEE CARTILAGE SURGERY Left 1986   w/ACL repair   LAPAROSCOPIC CHOLECYSTECTOMY  1994   LEFT HEART CATH AND CORONARY ANGIOGRAPHY N/A 07/08/2018   Procedure: LEFT HEART CATH AND CORONARY ANGIOGRAPHY;  Surgeon: Anner Alm ORN, MD;  Location: [facility] INVASIVE CV LAB;  Service: Cardiovascular;; Angiographically normal coronary arteries except ~20-25% prox & mid LAD.  Normal LVEDP.   PACEMAKER IMPLANT N/A 07/10/2018   Medtronic Azure XT DR MRI SureScan model [id number] (serial number [id number]) pacemaker by Dr Kelsie with His bundle lead   TEAR DUCT PROBING  1955   TONSILLECTOMY AND ADENOIDECTOMY  1968   TUBAL LIGATION  1982   UPPER GI ENDOSCOPY  06/28/2022   VAGINAL HYSTERECTOMY  1990   WISDOM TOOTH EXTRACTION  1967    MEDICATIONS:  acetaminophen  (TYLENOL ) 500 MG tablet   ALPRAZolam  (XANAX ) 0.25 MG tablet   aspirin  EC 81 MG tablet   atorvastatin  (LIPITOR) 10 MG tablet   atorvastatin  (LIPITOR) 20 MG tablet   cefUROXime  (CEFTIN ) 500 MG  tablet   cholecalciferol (VITAMIN D3) 25 MCG (1000 UNIT) tablet   dicyclomine  (BENTYL ) 10 MG capsule   famotidine (PEPCID) 20 MG tablet   fluticasone  (FLONASE ) 50 MCG/ACT nasal spray   ipratropium (ATROVENT ) 0.06 % nasal spray   losartan  (COZAAR ) 25 MG tablet   metroNIDAZOLE  (METROCREAM ) 0.75 % cream   montelukast  (SINGULAIR ) 10 MG tablet   mupirocin  ointment (BACTROBAN ) 2 %   nitroGLYCERIN  (NITROSTAT ) 0.4 MG SL tablet   omeprazole  (PRILOSEC) 20 MG capsule   omeprazole  (PRILOSEC) 40 MG capsule   Polyethyl Glycol-Propyl Glycol (SYSTANE OP)   Polyethylene Glycol 400 (BLINK TEARS OP)   predniSONE  (DELTASONE ) 10 MG tablet   valACYclovir  (VALTREX ) 1000 MG tablet   vitamin E 1000 UNIT capsule   XIIDRA 5 % SOLN   No current facility-administered medications for this encounter.   Burnard CHRISTELLA Odis DEVONNA [facility] Surgical Short Stay/Anesthesiology [facility] Phone [phone] 12/23/2024 10:56 AM        "

## 2024-12-23 NOTE — Anesthesia Preprocedure Evaluation (Addendum)
 "                                  Anesthesia Evaluation  Patient identified by MRN, date of birth, ID band Patient awake    Reviewed: Allergy & Precautions, NPO status , Patient's Chart, lab work & pertinent test results  History of Anesthesia Complications (+) PONV and history of anesthetic complications  Airway Mallampati: III  TM Distance: >3 FB Neck ROM: Full    Dental  (+) Dental Advisory Given   Pulmonary neg shortness of breath, asthma , neg sleep apnea, neg recent URI   breath sounds clear to auscultation       Cardiovascular hypertension, + CAD  + dysrhythmias + pacemaker  Rhythm:Regular     Neuro/Psych  Headaches PSYCHIATRIC DISORDERS Anxiety        GI/Hepatic Neg liver ROS,GERD  ,,  Endo/Other  negative endocrine ROS    Renal/GU negative Renal ROS     Musculoskeletal  (+) Arthritis ,    Abdominal   Peds  Hematology  (+) Blood dyscrasia, anemia Lab Results      Component                Value               Date                      WBC                      12.4 (H)            12/30/2024                HGB                      10.8 (L)            12/30/2024                HCT                      33.2 (L)            12/30/2024                MCV                      97.9                12/30/2024                PLT                      278                 12/30/2024              Anesthesia Other Findings   Reproductive/Obstetrics                              Anesthesia Physical Anesthesia Plan  ASA: 2  Anesthesia Plan: MAC, Spinal and Regional   Post-op Pain Management: Regional block*   Induction: Intravenous  PONV Risk Score and Plan: 4 or greater and Ondansetron  and Propofol  infusion  Airway Management Planned: Nasal Cannula, Natural Airway and  Simple Face Mask  Additional Equipment: None  Intra-op Plan:   Post-operative Plan:   Informed Consent: I have reviewed the patients History and  Physical, chart, labs and discussed the procedure including the risks, benefits and alternatives for the proposed anesthesia with the patient or authorized representative who has indicated his/her understanding and acceptance.     Dental advisory given  Plan Discussed with: CRNA  Anesthesia Plan Comments: (See PAT note from 1/13)         Anesthesia Quick Evaluation  "

## 2024-12-29 ENCOUNTER — Ambulatory Visit (HOSPITAL_COMMUNITY): Payer: Self-pay | Admitting: Medical

## 2024-12-29 ENCOUNTER — Encounter (HOSPITAL_COMMUNITY): Payer: Self-pay | Admitting: Orthopedic Surgery

## 2024-12-29 ENCOUNTER — Encounter (HOSPITAL_COMMUNITY): Admission: RE | Disposition: A | Payer: Self-pay | Source: Ambulatory Visit | Attending: Orthopedic Surgery

## 2024-12-29 ENCOUNTER — Observation Stay (HOSPITAL_COMMUNITY)

## 2024-12-29 ENCOUNTER — Other Ambulatory Visit: Payer: Self-pay

## 2024-12-29 ENCOUNTER — Ambulatory Visit (HOSPITAL_COMMUNITY): Admitting: Registered Nurse

## 2024-12-29 ENCOUNTER — Observation Stay (HOSPITAL_COMMUNITY)
Admission: RE | Admit: 2024-12-29 | Discharge: 2024-12-30 | Disposition: A | Discharge status: Home / Self Care | Source: Ambulatory Visit | Attending: Orthopedic Surgery | Admitting: Orthopedic Surgery

## 2024-12-29 DIAGNOSIS — I2510 Atherosclerotic heart disease of native coronary artery without angina pectoris: Secondary | ICD-10-CM | POA: Diagnosis not present

## 2024-12-29 DIAGNOSIS — Z96652 Presence of left artificial knee joint: Principal | ICD-10-CM

## 2024-12-29 DIAGNOSIS — I10 Essential (primary) hypertension: Secondary | ICD-10-CM | POA: Insufficient documentation

## 2024-12-29 DIAGNOSIS — M25562 Pain in left knee: Secondary | ICD-10-CM | POA: Diagnosis present

## 2024-12-29 DIAGNOSIS — Z85828 Personal history of other malignant neoplasm of skin: Secondary | ICD-10-CM | POA: Insufficient documentation

## 2024-12-29 DIAGNOSIS — Z01818 Encounter for other preprocedural examination: Secondary | ICD-10-CM

## 2024-12-29 DIAGNOSIS — M1712 Unilateral primary osteoarthritis, left knee: Secondary | ICD-10-CM

## 2024-12-29 DIAGNOSIS — Z79899 Other long term (current) drug therapy: Secondary | ICD-10-CM | POA: Insufficient documentation

## 2024-12-29 DIAGNOSIS — Z955 Presence of coronary angioplasty implant and graft: Secondary | ICD-10-CM | POA: Diagnosis not present

## 2024-12-29 DIAGNOSIS — J45909 Unspecified asthma, uncomplicated: Secondary | ICD-10-CM | POA: Diagnosis not present

## 2024-12-29 HISTORY — PX: TOTAL KNEE ARTHROPLASTY: SHX125

## 2024-12-29 MED ORDER — ACETAMINOPHEN 500 MG PO TABS
1000.0000 mg | ORAL_TABLET | Freq: Four times a day (QID) | ORAL | Status: DC
  Administered 2024-12-29 – 2024-12-30 (×3): 1000 mg via ORAL
  Filled 2024-12-29 (×3): qty 2

## 2024-12-29 MED ORDER — DIPHENHYDRAMINE HCL 12.5 MG/5ML PO ELIX: 12.5000 mg | ORAL_SOLUTION | ORAL | Status: DC | PRN

## 2024-12-29 MED ORDER — CEFAZOLIN SODIUM-DEXTROSE 2-4 GM/100ML-% IV SOLN
2.0000 g | INTRAVENOUS | Status: AC
  Administered 2024-12-29: 2 g via INTRAVENOUS
  Filled 2024-12-29: qty 100

## 2024-12-29 MED ORDER — DEXAMETHASONE SOD PHOSPHATE PF 10 MG/ML IJ SOLN
10.0000 mg | Freq: Once | INTRAMUSCULAR | Status: AC
  Administered 2024-12-30: 10 mg via INTRAVENOUS
  Filled 2024-12-29: qty 1

## 2024-12-29 MED ORDER — OXYCODONE HCL 5 MG/5ML PO SOLN: 5.0000 mg | Freq: Once | ORAL | Status: DC | PRN

## 2024-12-29 MED ORDER — METOCLOPRAMIDE HCL 5 MG/ML IJ SOLN: 5.0000 mg | Freq: Three times a day (TID) | INTRAMUSCULAR | Status: DC | PRN

## 2024-12-29 MED ORDER — PROPOFOL 1000 MG/100ML IV EMUL
INTRAVENOUS | Status: AC
  Filled 2024-12-29: qty 100

## 2024-12-29 MED ORDER — PROPOFOL 10 MG/ML IV BOLUS
INTRAVENOUS | Status: DC | PRN
  Administered 2024-12-29: 40 mg via INTRAVENOUS

## 2024-12-29 MED ORDER — PHENYLEPHRINE HCL-NACL 20-0.9 MG/250ML-% IV SOLN
INTRAVENOUS | Status: DC | PRN
  Administered 2024-12-29: 25 ug/min via INTRAVENOUS

## 2024-12-29 MED ORDER — ONDANSETRON HCL 4 MG/2ML IJ SOLN
INTRAMUSCULAR | Status: DC | PRN
  Administered 2024-12-29: 4 mg via INTRAVENOUS

## 2024-12-29 MED ORDER — METHOCARBAMOL 1000 MG/10ML IJ SOLN: 500.0000 mg | Freq: Four times a day (QID) | INTRAMUSCULAR | Status: DC | PRN

## 2024-12-29 MED ORDER — 0.9 % SODIUM CHLORIDE (POUR BTL) OPTIME
TOPICAL | Status: DC | PRN
  Administered 2024-12-29: 1000 mL

## 2024-12-29 MED ORDER — BUPIVACAINE-EPINEPHRINE (PF) 0.25% -1:200000 IJ SOLN
INTRAMUSCULAR | Status: AC
  Filled 2024-12-29: qty 30

## 2024-12-29 MED ORDER — ORAL CARE MOUTH RINSE: 15.0000 mL | Freq: Once | OROMUCOSAL | Status: AC

## 2024-12-29 MED ORDER — TRANEXAMIC ACID-NACL 1000-0.7 MG/100ML-% IV SOLN
1000.0000 mg | INTRAVENOUS | Status: AC
  Administered 2024-12-29: 1000 mg via INTRAVENOUS
  Filled 2024-12-29: qty 100

## 2024-12-29 MED ORDER — ALUM & MAG HYDROXIDE-SIMETH 200-200-20 MG/5ML PO SUSP: 30.0000 mL | ORAL | Status: DC | PRN

## 2024-12-29 MED ORDER — ONDANSETRON HCL 4 MG PO TABS: 4.0000 mg | ORAL_TABLET | Freq: Four times a day (QID) | ORAL | Status: DC | PRN

## 2024-12-29 MED ORDER — FENTANYL CITRATE (PF) 50 MCG/ML IJ SOSY: 25.0000 ug | PREFILLED_SYRINGE | INTRAMUSCULAR | Status: DC

## 2024-12-29 MED ORDER — DEXAMETHASONE SOD PHOSPHATE PF 10 MG/ML IJ SOLN
8.0000 mg | Freq: Once | INTRAMUSCULAR | Status: AC
  Administered 2024-12-29: 8 mg via INTRAVENOUS

## 2024-12-29 MED ORDER — ONDANSETRON HCL 4 MG/2ML IJ SOLN: 4.0000 mg | Freq: Four times a day (QID) | INTRAMUSCULAR | Status: DC | PRN

## 2024-12-29 MED ORDER — SENNA 8.6 MG PO TABS
2.0000 | ORAL_TABLET | Freq: Every day | ORAL | Status: DC
  Administered 2024-12-29: 17.2 mg via ORAL
  Filled 2024-12-29: qty 2

## 2024-12-29 MED ORDER — FAMOTIDINE 20 MG PO TABS
20.0000 mg | ORAL_TABLET | Freq: Every day | ORAL | Status: DC
  Administered 2024-12-29: 20 mg via ORAL
  Filled 2024-12-29: qty 1

## 2024-12-29 MED ORDER — METOCLOPRAMIDE HCL 5 MG PO TABS: 5.0000 mg | ORAL_TABLET | Freq: Three times a day (TID) | ORAL | Status: DC | PRN

## 2024-12-29 MED ORDER — KETOROLAC TROMETHAMINE 30 MG/ML IJ SOLN
INTRAMUSCULAR | Status: AC
  Filled 2024-12-29: qty 1

## 2024-12-29 MED ORDER — SODIUM CHLORIDE (PF) 0.9 % IJ SOLN
INTRAMUSCULAR | Status: DC | PRN
  Administered 2024-12-29: 61 mL

## 2024-12-29 MED ORDER — DEXMEDETOMIDINE HCL IN NACL 80 MCG/20ML IV SOLN
INTRAVENOUS | Status: AC
  Filled 2024-12-29: qty 20

## 2024-12-29 MED ORDER — BISACODYL 10 MG RE SUPP: 10.0000 mg | Freq: Every day | RECTAL | Status: DC | PRN

## 2024-12-29 MED ORDER — LACTATED RINGERS IV SOLN: INTRAVENOUS | Status: DC

## 2024-12-29 MED ORDER — HYDROMORPHONE HCL 1 MG/ML IJ SOLN: 0.5000 mg | INTRAMUSCULAR | Status: DC | PRN

## 2024-12-29 MED ORDER — LOSARTAN POTASSIUM 25 MG PO TABS
25.0000 mg | ORAL_TABLET | Freq: Two times a day (BID) | ORAL | Status: DC
  Administered 2024-12-30: 25 mg via ORAL
  Filled 2024-12-29: qty 1

## 2024-12-29 MED ORDER — ASPIRIN 81 MG PO CHEW
81.0000 mg | CHEWABLE_TABLET | Freq: Two times a day (BID) | ORAL | Status: DC
  Administered 2024-12-29 – 2024-12-30 (×2): 81 mg via ORAL
  Filled 2024-12-29 (×2): qty 1

## 2024-12-29 MED ORDER — MENTHOL 3 MG MT LOZG: 1.0000 | LOZENGE | OROMUCOSAL | Status: DC | PRN

## 2024-12-29 MED ORDER — BUPIVACAINE IN DEXTROSE 0.75-8.25 % IT SOLN
INTRATHECAL | Status: DC | PRN
  Administered 2024-12-29: 1.6 mL via INTRATHECAL

## 2024-12-29 MED ORDER — OXYCODONE HCL 5 MG PO TABS: 10.0000 mg | ORAL_TABLET | ORAL | Status: DC | PRN

## 2024-12-29 MED ORDER — ACETAMINOPHEN 10 MG/ML IV SOLN
INTRAVENOUS | Status: AC
  Filled 2024-12-29: qty 100

## 2024-12-29 MED ORDER — DEXAMETHASONE SOD PHOSPHATE PF 10 MG/ML IJ SOLN
INTRAMUSCULAR | Status: AC
  Filled 2024-12-29: qty 1

## 2024-12-29 MED ORDER — PROPOFOL 500 MG/50ML IV EMUL
INTRAVENOUS | Status: DC | PRN
  Administered 2024-12-29: 40 ug/kg/min via INTRAVENOUS

## 2024-12-29 MED ORDER — OXYCODONE HCL 5 MG PO TABS: 5.0000 mg | ORAL_TABLET | Freq: Once | ORAL | Status: DC | PRN

## 2024-12-29 MED ORDER — TRANEXAMIC ACID-NACL 1000-0.7 MG/100ML-% IV SOLN
1000.0000 mg | Freq: Once | INTRAVENOUS | Status: AC
  Administered 2024-12-29: 1000 mg via INTRAVENOUS
  Filled 2024-12-29: qty 100

## 2024-12-29 MED ORDER — STERILE WATER FOR IRRIGATION IR SOLN
Status: DC | PRN
  Administered 2024-12-29: 2000 mL

## 2024-12-29 MED ORDER — PANTOPRAZOLE SODIUM 40 MG PO TBEC
40.0000 mg | DELAYED_RELEASE_TABLET | Freq: Every day | ORAL | Status: DC
  Administered 2024-12-30: 40 mg via ORAL
  Filled 2024-12-29: qty 1

## 2024-12-29 MED ORDER — SODIUM CHLORIDE 0.9 % IV SOLN: INTRAVENOUS | Status: DC

## 2024-12-29 MED ORDER — ATORVASTATIN CALCIUM 10 MG PO TABS: 10.0000 mg | ORAL_TABLET | Freq: Every evening | ORAL | Status: DC

## 2024-12-29 MED ORDER — CEFAZOLIN SODIUM-DEXTROSE 2-4 GM/100ML-% IV SOLN
2.0000 g | Freq: Four times a day (QID) | INTRAVENOUS | Status: AC
  Administered 2024-12-29 (×2): 2 g via INTRAVENOUS
  Filled 2024-12-29 (×2): qty 100

## 2024-12-29 MED ORDER — MONTELUKAST SODIUM 10 MG PO TABS
10.0000 mg | ORAL_TABLET | Freq: Every day | ORAL | Status: DC
  Administered 2024-12-30: 10 mg via ORAL
  Filled 2024-12-29: qty 1

## 2024-12-29 MED ORDER — ONDANSETRON HCL 4 MG/2ML IJ SOLN
INTRAMUSCULAR | Status: AC
  Filled 2024-12-29: qty 2

## 2024-12-29 MED ORDER — POLYETHYLENE GLYCOL 3350 17 G PO PACK
17.0000 g | PACK | Freq: Two times a day (BID) | ORAL | Status: DC
  Administered 2024-12-29 – 2024-12-30 (×2): 17 g via ORAL
  Filled 2024-12-29 (×2): qty 1

## 2024-12-29 MED ORDER — SODIUM CHLORIDE 0.9 % IR SOLN
Status: DC | PRN
  Administered 2024-12-29: 1000 mL

## 2024-12-29 MED ORDER — FENTANYL CITRATE (PF) 50 MCG/ML IJ SOSY: 25.0000 ug | PREFILLED_SYRINGE | INTRAMUSCULAR | Status: DC | PRN

## 2024-12-29 MED ORDER — CHLORHEXIDINE GLUCONATE 0.12 % MT SOLN
15.0000 mL | Freq: Once | OROMUCOSAL | Status: AC
  Administered 2024-12-29: 15 mL via OROMUCOSAL

## 2024-12-29 MED ORDER — OXYCODONE HCL 5 MG PO TABS
5.0000 mg | ORAL_TABLET | ORAL | Status: DC | PRN
  Administered 2024-12-30 (×2): 5 mg via ORAL
  Filled 2024-12-29 (×2): qty 1

## 2024-12-29 MED ORDER — MIDAZOLAM HCL (PF) 2 MG/2ML IJ SOLN: 1.0000 mg | INTRAMUSCULAR | Status: DC

## 2024-12-29 MED ORDER — PHENOL 1.4 % MT LIQD: 1.0000 | OROMUCOSAL | Status: DC | PRN

## 2024-12-29 MED ORDER — ACETAMINOPHEN 10 MG/ML IV SOLN
INTRAVENOUS | Status: DC | PRN
  Administered 2024-12-29: 1000 mg via INTRAVENOUS

## 2024-12-29 MED ORDER — DEXMEDETOMIDINE HCL IN NACL 80 MCG/20ML IV SOLN
INTRAVENOUS | Status: DC | PRN
  Administered 2024-12-29: 8 ug via INTRAVENOUS
  Administered 2024-12-29: 4 ug via INTRAVENOUS
  Administered 2024-12-29: 12 ug via INTRAVENOUS

## 2024-12-29 MED ORDER — POVIDONE-IODINE 10 % EX SWAB
2.0000 | Freq: Once | CUTANEOUS | Status: AC
  Administered 2024-12-29: 2 via TOPICAL

## 2024-12-29 MED ORDER — SODIUM CHLORIDE (PF) 0.9 % IJ SOLN
INTRAMUSCULAR | Status: AC
  Filled 2024-12-29: qty 30

## 2024-12-29 MED ORDER — METHOCARBAMOL 500 MG PO TABS
500.0000 mg | ORAL_TABLET | Freq: Four times a day (QID) | ORAL | Status: DC | PRN
  Administered 2024-12-30: 500 mg via ORAL
  Filled 2024-12-29: qty 1

## 2024-12-29 MED ORDER — BUPIVACAINE-EPINEPHRINE (PF) 0.5% -1:200000 IJ SOLN
INTRAMUSCULAR | Status: DC | PRN
  Administered 2024-12-29: 20 mL via PERINEURAL

## 2024-12-29 MED ORDER — ACETAMINOPHEN 10 MG/ML IV SOLN: 1000.0000 mg | Freq: Once | INTRAVENOUS | Status: DC | PRN

## 2024-12-29 NOTE — Anesthesia Procedure Notes (Addendum)
 Spinal  Patient location during procedure: OR Start time: 12/29/2024 9:38 AM End time: 12/29/2024 9:45 AM Reason for block: surgical anesthesia  Staffing Performed: anesthesiologist  Authorized by: Leopoldo Bruckner, MD   Performed by: Leopoldo Bruckner, MD  Preanesthetic Checklist Completed: patient identified, IV checked, risks and benefits discussed, surgical consent, monitors and equipment checked, pre-op  evaluation and timeout performed Spinal Block Patient position: sitting Prep: DuraPrep Patient monitoring: heart rate, cardiac monitor, continuous pulse ox and blood pressure Approach: midline Location: L4-5 Injection technique: single-shot Needle Needle type: Pencan  Needle gauge: 24 G Needle length: 9 cm Assessment Events: CSF return and second provider

## 2024-12-29 NOTE — Evaluation (Signed)
 Physical Therapy Evaluation Patient Details Name: Anna Ellis MRN: [id number] DOB: Sep 19, 1951 Today's Date: 12/29/2024  History of Present Illness  74 yo female presents to therapy s/p L TKA on 12/29/2024 due to failure of conservative measures. Pt PMH includes but is not limited to: CAD, mitral regurgitation, carotid stenosis, cervical adenopathy and pain, syncope, pacemaker, HTN, OA, HLD, arthritis, and GERD.  Clinical Impression      Anna Ellis is a 74 y.o. female POD 0 s/p L TKA. Patient reports IND with mobility at baseline. Patient is now limited by functional impairments (see PT problem list below) and requires S for bed mobility and CGA for transfers. Patient was able to ambulate 45 feet with RW and CGA level of assist. Patient instructed in exercise to facilitate ROM and circulation to manage edema. Patient will benefit from continued skilled PT interventions to address impairments and progress towards PLOF. Acute PT will follow to progress mobility and stair training in preparation for safe discharge home with family support and OPPT services.     If plan is discharge home, recommend the following: A little help with walking and/or transfers;A little help with bathing/dressing/bathroom;Assistance with cooking/housework;Assist for transportation;Help with stairs or ramp for entrance   Can travel by private vehicle        Equipment Recommendations Rolling walker (2 wheels)  Recommendations for Other Services       Functional Status Assessment Patient has had a recent decline in their functional status and demonstrates the ability to make significant improvements in function in a reasonable and predictable amount of time.     Precautions / Restrictions Precautions Precautions: Knee;Fall Restrictions Weight Bearing Restrictions Per Provider Order: No      Mobility  Bed Mobility Overal bed mobility: Needs Assistance Bed Mobility: Supine to Sit     Supine to sit:  Supervision, HOB elevated     General bed mobility comments: min cues    Transfers Overall transfer level: Needs assistance Equipment used: Rolling walker (2 wheels) Transfers: Sit to/from Stand Sit to Stand: Contact guard assist           General transfer comment: min cues    Ambulation/Gait Ambulation/Gait assistance: Contact guard assist Gait Distance (Feet): 45 Feet Assistive device: Rolling walker (2 wheels) Gait Pattern/deviations: Step-to pattern, Decreased stance time - left, Antalgic, Trunk flexed Gait velocity: decreased     General Gait Details: slight trunk flexion with B UE support at RW to offload L LE in stance phase with pt performing step almost through pattern with min cues  Stairs            Wheelchair Mobility     Tilt Bed    Modified Rankin (Stroke Patients Only)       Balance Overall balance assessment: Needs assistance Sitting-balance support: Feet supported Sitting balance-Leahy Scale: Good     Standing balance support: Bilateral upper extremity supported, During functional activity, Reliant on assistive device for balance Standing balance-Leahy Scale: Poor                               Pertinent Vitals/Pain Pain Assessment Pain Assessment: 0-10 Pain Score: 0-No pain Pain Location: L LE and knee Pain Descriptors / Indicators: Heaviness Pain Intervention(s): Monitored during session, Premedicated before session, Repositioned, Ice applied, Limited activity within patient's tolerance    Home Living Family/patient expects to be discharged to:: Private residence Living Arrangements: Spouse/significant other Available Help at Discharge:  Family Type of Home: House Home Access: Stairs to enter Entrance Stairs-Rails: Right (one step in the front of the house no handrail) Entrance Stairs-Number of Steps: 3   Home Layout: Two level;Able to live on main level with bedroom/bathroom Home Equipment: Grab bars -  tub/shower;Shower seat - built in      Prior Function Prior Level of Function : Independent/Modified Independent;Driving             Mobility Comments: IND no AD for all ADLs self care tasks and IADLs       Extremity/Trunk Assessment        Lower Extremity Assessment Lower Extremity Assessment: LLE deficits/detail LLE Deficits / Details: ankle DF/PF  5/5; SLR < 10 degree lag LLE Sensation: decreased proprioception;decreased light touch    Cervical / Trunk Assessment Cervical / Trunk Assessment: Normal  Communication   Communication Communication: No apparent difficulties    Cognition Arousal: Alert Behavior During Therapy: WFL for tasks assessed/performed   PT - Cognitive impairments: No apparent impairments                         Following commands: Intact       Cueing       General Comments      Exercises Total Joint Exercises Ankle Circles/Pumps: AROM, Both, 10 reps Heel Slides: AROM, Left, 5 reps   Assessment/Plan    PT Assessment Patient needs continued PT services  PT Problem List Decreased strength;Decreased range of motion;Decreased activity tolerance;Decreased balance;Decreased mobility;Decreased coordination;Pain       PT Treatment Interventions DME instruction;Gait training;Stair training;Functional mobility training;Therapeutic activities;Therapeutic exercise;Balance training;Neuromuscular re-education;Patient/family education;Modalities    PT Goals (Current goals can be found in the Care Plan section)  Acute Rehab PT Goals Patient Stated Goal: to be able to climb the step stool and attend baseball games this spring PT Goal Formulation: With patient Time For Goal Achievement: 01/12/25 Potential to Achieve Goals: Good    Frequency 7X/week     Co-evaluation               AM-PAC PT 6 Clicks Mobility  Outcome Measure Help needed turning from your back to your side while in a flat bed without using bedrails?:  None Help needed moving from lying on your back to sitting on the side of a flat bed without using bedrails?: A Little Help needed moving to and from a bed to a chair (including a wheelchair)?: A Little Help needed standing up from a chair using your arms (e.g., wheelchair or bedside chair)?: A Little Help needed to walk in hospital room?: A Little Help needed climbing 3-5 steps with a railing? : A Lot 6 Click Score: 18    End of Session Equipment Utilized During Treatment: Gait belt Activity Tolerance: Patient tolerated treatment well;No increased pain Patient left: in chair;with call bell/phone within reach;with chair alarm set;with family/visitor present Nurse Communication: Mobility status PT Visit Diagnosis: Unsteadiness on feet (R26.81);Other abnormalities of gait and mobility (R26.89);Muscle weakness (generalized) (M62.81);Difficulty in walking, not elsewhere classified (R26.2);Pain Pain - Right/Left: Left Pain - part of body: Knee;Leg    Time: 8364-8344 PT Time Calculation (min) (ACUTE ONLY): 20 min   Charges:   PT Evaluation $PT Eval Low Complexity: 1 Low   PT General Charges $$ ACUTE PT VISIT: 1 Visit         [place], PT Acute Rehab   [place] VEAR Drone 12/29/2024, 6:00 PM

## 2024-12-29 NOTE — Interval H&P Note (Signed)
 History and Physical Interval Note:  12/29/2024 8:33 AM  Anna Ellis  has presented today for surgery, with the diagnosis of Left knee osteoarthritis.  The various methods of treatment have been discussed with the patient and family. After consideration of risks, benefits and other options for treatment, the patient has consented to  Procedures: ARTHROPLASTY, KNEE, TOTAL (Left) as a surgical intervention.  The patient's history has been reviewed, patient examined, no change in status, stable for surgery.  I have reviewed the patient's chart and labs.  Questions were answered to the patient's satisfaction.     [name]

## 2024-12-29 NOTE — H&P (Signed)
 TOTAL KNEE ADMISSION H&P  Patient is being admitted for left total knee arthroplasty.  Therapy Plans: outpatient therapy at [facility] Disposition: Home with husband Planned DVT Prophylaxis: aspirin  81mg  BID DME needed: walker PCP: Dr. Garald - clearance received Cardio: Dr. Anner - clearance received TXA: IV Allergies: cobalt - rash , etodolac  - gastritis, clarithromycin, red dye - hives Anesthesia Concerns: none BMI: 28.6 Last HgbA1c: Not diabetic   Other: - NICKEL FREE BIOMET // TIVANIUM - Ice machine at hospital - oxycodone , robaxin , tylenol  - NO NSAIDs -- major gastritis/reflux issues that just resolved  Subjective:  Chief Complaint: Left knee pain.  HPI: Anna Ellis, 74 y.o. female has a history of pain and functional disability in the left knee due to osteoarthritis and has failed non-surgical conservative treatments for greater than 12 weeks to include NSAID's and/or analgesics, corticosteriod injections, and activity modification. Onset of symptoms was gradual, starting 2 years ago with gradually worsening course since that time. The patient noted no past surgery on the left knee.  Patient currently rates pain in the left knee at 8 out of 10 with activity. Patient has worsening of pain with activity and weight bearing and pain that interferes with activities of daily living. Patient has evidence of joint space narrowing by imaging studies. There is no active infection.  Patient Active Problem List   Diagnosis Date Noted   Coronary artery disease, non-occlusive 10/12/2024   Mild mitral regurgitation 10/12/2024   Preoperative cardiovascular examination 10/09/2024   Myalgia 08/18/2024   Carotid stenosis, asymptomatic 08/18/2024   Polycythemia 08/18/2024   Neck pain 08/04/2024   Cervical adenopathy 08/04/2024   Diverticulitis 04/17/2024   Syncope and collapse 06/30/2023   RUQ pain 05/01/2022   Early satiety 05/01/2022   Hx of cholecystectomy 05/01/2022   Osteopenia  01/03/2022   Pacemaker 12/27/2021   HTN (hypertension) 02/23/2020   Allergy to food dye 06/23/2019   Sore in nose 06/23/2019   Second degree Mobitz II AV block 07/10/2018   Abnormal nuclear stress test 07/04/2018   Frequent PVCs 07/04/2018   Symptomatic bradycardia 07/02/2018   Chest tightness 07/01/2018   2nd degree AV block 07/01/2018   Tongue discoloration 11/19/2017   Throat pain in adult 09/03/2016   Chronic left SI joint pain 01/20/2015   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   Atypical facial pain 07/22/2013   Mouth ulcer 02/24/2013   Right ear pain 10/30/2012   Well adult exam 01/14/2012   Serum potassium elevated 01/14/2012   Cystitis 01/14/2012   Cerumen impaction 11/19/2011   Nausea 09/11/2011   Fibrocystic breast disease 08/06/2011   Family history of breast cancer 08/06/2011   Gastroesophageal reflux disease 07/11/2011   Asthma with hay fever 06/15/2011   Bilateral carotid bruits 01/08/2011   Headache 07/24/2010   Nonspecific (abnormal) findings on radiological and other examination of body structure 01/25/2010   ABNORMAL CHEST XRAY 01/25/2010   SHOULDER PAIN 01/06/2010   URTICARIA 12/27/2008   Allergic urticaria 12/15/2008   Asthma, cough variant 12/15/2008   ELEVATED BLOOD PRESSURE WITHOUT DIAGNOSIS OF HYPERTENSION 12/15/2008   Sinusitis 11/19/2007   Hyperlipidemia LDL goal <70 09/19/2007   ANEMIA-IRON DEFICIENCY 09/19/2007   Anxiety disorder 09/19/2007   Allergic rhinitis 09/19/2007   OSTEOARTHRITIS 09/19/2007   RASH-NONVESICULAR 09/19/2007    Past Medical History:  Diagnosis Date   ALLERGIC RHINITIS 09/19/2007   Allergy    ANEMIA-IRON DEFICIENCY 09/19/2007   Arthritis    neck (07/10/2018)   Basal cell cancer    back of my  neck; scraped it off   CAROTID BRUIT 01/08/2011   GERD (gastroesophageal reflux disease)    High blood pressure    History of gallstones    Hyperlipidemia    Mobitz II    a. s/p MDT dual chamber (His Bundle) pacemaker 07/2018 -  Dr Kelsie   OSTEOARTHRITIS 09/19/2007   Pneumonia    PONV (postoperative nausea and vomiting)    Presence of permanent cardiac pacemaker     Past Surgical History:  Procedure Laterality Date   ANTERIOR CRUCIATE LIGAMENT REPAIR Left 2001   donor ACL   BREAST LUMPECTOMY Bilateral 2002   both benign   COLONOSCOPY  09/12/2017   Dr Luis. have diverticulosis   ESOPHAGOGASTRODUODENOSCOPY  1994   INGUINAL HERNIA REPAIR Right 1971   KNEE CARTILAGE SURGERY Left 1986   w/ACL repair   LAPAROSCOPIC CHOLECYSTECTOMY  1994   LEFT HEART CATH AND CORONARY ANGIOGRAPHY N/A 07/08/2018   Procedure: LEFT HEART CATH AND CORONARY ANGIOGRAPHY;  Surgeon: Anner Alm ORN, MD;  Location: [facility] INVASIVE CV LAB;  Service: Cardiovascular;; Angiographically normal coronary arteries except ~20-25% prox & mid LAD.  Normal LVEDP.   PACEMAKER IMPLANT N/A 07/10/2018   Medtronic Azure XT DR MRI SureScan model [id number] (serial number [id number]) pacemaker by Dr Kelsie with His bundle lead   TEAR DUCT PROBING  1955   TONSILLECTOMY AND ADENOIDECTOMY  1968   TUBAL LIGATION  1982   UPPER GI ENDOSCOPY  06/28/2022   VAGINAL HYSTERECTOMY  1990   WISDOM TOOTH EXTRACTION  1967    Prior to Admission medications  Medication Sig Start Date End Date Taking? Authorizing Provider  acetaminophen  (TYLENOL ) 500 MG tablet Take 1,000 mg by mouth every 6 (six) hours as needed for moderate pain (pain score 4-6).   Yes [provider]  aspirin  EC 81 MG tablet Take 81 mg by mouth daily.   Yes [provider]  atorvastatin  (LIPITOR) 10 MG tablet Take 10 mg by mouth daily.   Yes [provider]  cefUROXime  (CEFTIN ) 500 MG tablet Take 1 tablet (500 mg total) by mouth 2 (two) times daily with a meal. 11/26/24  Yes Plotnikov, Karlynn GAILS, MD  cholecalciferol (VITAMIN D3) 25 MCG (1000 UNIT) tablet Take 1,000 Units by mouth daily.   Yes [provider]  famotidine  (PEPCID ) 20 MG tablet Take 20 mg by mouth at  bedtime.   Yes [provider]  losartan  (COZAAR ) 25 MG tablet Take 25 mg by mouth 2 (two) times daily.   Yes [provider]  montelukast  (SINGULAIR ) 10 MG tablet Take 1 tablet (10 mg total) by mouth daily. 01/29/24 01/28/25 Yes Plotnikov, Karlynn GAILS, MD  nitroGLYCERIN  (NITROSTAT ) 0.4 MG SL tablet Place 1 tablet (0.4 mg total) under the tongue every 5 (five) minutes as needed for chest pain. 07/02/24 12/17/24 Yes Duke, Jon Garre, PA  omeprazole  (PRILOSEC) 20 MG capsule Take 20 mg by mouth daily. 11/26/24  Yes [provider]  Polyethyl Glycol-Propyl Glycol (SYSTANE OP) Place 1 drop into both eyes as needed (dry eyes).   Yes [provider]  Polyethylene Glycol 400 (BLINK TEARS OP) Place 1 drop into both eyes as needed (dry eyes).   Yes [provider]  vitamin E 1000 UNIT capsule Take 1,000 Units by mouth daily.   Yes [provider]  XIIDRA 5 % SOLN Place 1 drop into both eyes in the morning and at bedtime. 05/23/23  Yes [provider]  ALPRAZolam  (XANAX ) 0.25 MG tablet Take  1 tablet (0.25 mg total) by mouth 2 (two) times daily as needed for sleep or anxiety. Patient not taking: Reported on 12/17/2024 01/09/24   Plotnikov, Aleksei V, MD  atorvastatin  (LIPITOR) 20 MG tablet Take 1 tablet (20 mg total) by mouth daily. Patient not taking: No sig reported 07/02/24 09/30/24  Madie Jon Garre, PA  dicyclomine  (BENTYL ) 10 MG capsule Take 1 capsule (10 mg total) by mouth 3 (three) times daily as needed for spasms. Patient not taking: No sig reported 08/18/24   Federico Rosario BROCKS, MD  fluticasone  (FLONASE ) 50 MCG/ACT nasal spray Place 1 spray into both nostrils daily. Patient not taking: Reported on 12/17/2024 11/18/24   Mercer Clotilda SAUNDERS, MD  ipratropium (ATROVENT ) 0.06 % nasal spray Place 2 sprays into the nose 3 (three) times daily. Patient not taking: Reported on 12/17/2024 01/09/24 01/08/25  Plotnikov, Karlynn GAILS, MD  metroNIDAZOLE  (METROCREAM ) 0.75 %  cream Apply 1 application topically daily as needed (for rosacea). Patient not taking: Reported on 12/17/2024 11/04/19   Plotnikov, Aleksei V, MD  mupirocin  ointment (BACTROBAN ) 2 % On leg wound w/dressing change qd or bid Patient not taking: Reported on 12/17/2024 11/26/24   Plotnikov, Aleksei V, MD  omeprazole  (PRILOSEC) 40 MG capsule Take 1 capsule (40 mg total) by mouth daily. Patient not taking: Reported on 12/17/2024 08/18/24   Federico Rosario BROCKS, MD  predniSONE  (DELTASONE ) 10 MG tablet Prednisone  10 mg: take 4 tabs a day x 3 days; then 3 tabs a day x 4 days; then 2 tabs a day x 4 days, then 1 tab a day x 6 days, then stop. Take pc. 11/26/24   Plotnikov, Karlynn GAILS, MD  valACYclovir  (VALTREX ) 1000 MG tablet Take 1 tablet (1,000 mg total) by mouth 3 (three) times daily. Patient not taking: Reported on 12/17/2024 11/26/24   Plotnikov, Karlynn GAILS, MD    Allergies[1]  Social History   Socioeconomic History   Marital status: Married    Spouse name: Not on file   Number of children: 3   Years of education: Not on file   Highest education level: 12th grade  Occupational History   Occupation: Retired    Associate Professor: RETIRED    Comment: Environmental Health Practitioner  Tobacco Use   Smoking status: Never   Smokeless tobacco: Never  Vaping Use   Vaping status: Never Used  Substance and Sexual Activity   Alcohol use: Never   Drug use: Never   Sexual activity: Yes  Other Topics Concern   Not on file  Social History Narrative   She is a married (49 years).  They have 3 children and 5 grandchildren.  She is a retired horticulturist, commercial for [employer].   She regularly exercises walking and using stairs etc.   Social Drivers of Health   Tobacco Use: Low Risk (12/22/2024)   Patient History    Smoking Tobacco Use: Never    Smokeless Tobacco Use: Never    Passive Exposure: Not on file  Financial Resource Strain: Low Risk (10/19/2024)   Overall Financial Resource Strain (CARDIA)    Difficulty  of Paying Living Expenses: Not hard at all  Food Insecurity: No Food Insecurity (10/19/2024)   Epic    Worried About Programme Researcher, Broadcasting/film/video in the Last Year: Never true    Ran Out of Food in the Last Year: Never true  Transportation Needs: No Transportation Needs (10/19/2024)   Epic    Lack of Transportation (Medical): No    Lack of Transportation (  Non-Medical): No  Physical Activity: Unknown (10/19/2024)   Exercise Vital Sign    Days of Exercise per Week: Patient declined    Minutes of Exercise per Session: Not on file  Stress: No Stress Concern Present (10/19/2024)   [employer] of Occupational Health - Occupational Stress Questionnaire    Feeling of Stress: Not at all  Social Connections: Socially Integrated (10/19/2024)   Social Connection and Isolation Panel    Frequency of Communication with Friends and Family: More than three times a week    Frequency of Social Gatherings with Friends and Family: More than three times a week    Attends Religious Services: More than 4 times per year    Active Member of [employer] or Organizations: Yes    Attends Banker Meetings: 1 to 4 times per year    Marital Status: Married  Catering Manager Violence: Not on file  Depression (PHQ2-9): Low Risk (09/05/2023)   Depression (PHQ2-9)    PHQ-2 Score: 0  Alcohol Screen: Not on file  Housing: Low Risk (10/19/2024)   Epic    Unable to Pay for Housing in the Last Year: No    Number of Times Moved in the Last Year: 0    Homeless in the Last Year: No  Utilities: Not on file  Health Literacy: Not on file    Tobacco Use: Low Risk (12/22/2024)   Patient History    Smoking Tobacco Use: Never    Smokeless Tobacco Use: Never    Passive Exposure: Not on file   Social History   Substance and Sexual Activity  Alcohol Use Never    Family History  Problem Relation Age of Onset   Breast cancer Mother        breast   Cancer Mother    Heart disease Father        h/o Rheumatic Fever -  Rheumatic MV Dz.   Colon cancer Paternal Aunt 17 - 23   Tuberculosis Maternal Grandmother        died in her 16s   Throat cancer Maternal Grandfather    Pneumonia Paternal Grandmother    Allergies Son    Asthma Son    Esophageal cancer Neg Hx    Rectal cancer Neg Hx    Stomach cancer Neg Hx     Review Of Systems: Constitutional: Constitutional: no fever, chills, night sweats, or significant weight loss. Cardiovascular: Cardiovascular: no palpitations or chest pain. Respiratory: Respiratory: no cough or shortness of breath and No COPD. Gastrointestinal: Gastrointestinal: no vomiting or nausea. Musculoskeletal: Musculoskeletal: Joint Pain and swelling in Joints. Neurologic: Neurologic: no numbness, tingling, or difficulty with balance.  Objective:  Physical Exam: Bilateral knee exams: No palpable effusions, warmth or erythema Valgus bilateral knees with passively correctable valgus Slight flexion contractures left greater than right with flexion over 110 degrees Tenderness over the lateral and anterior aspect knees  Vital signs in last 24 hours:    Imaging Review Plain radiographs demonstrate severe degenerative joint disease of the left knee.  The bone quality appears to be adequate for age and reported activity level.  Assessment/Plan:  End stage arthritis, left knee   The patient history, physical examination, clinical judgment of the provider and imaging studies are consistent with end stage degenerative joint disease of the left knee and total knee arthroplasty is deemed medically necessary. The treatment options including medical management, injection therapy arthroscopy and arthroplasty were discussed at length. The risks and benefits of total knee arthroplasty were presented  and reviewed. The risks due to aseptic loosening, infection, stiffness, patella tracking problems, thromboembolic complications and other imponderables were discussed. The patient acknowledged the  explanation, agreed to proceed with the plan and consent was signed. Patient is being admitted for inpatient treatment for surgery, pain control, PT, OT, prophylactic antibiotics, VTE prophylaxis, progressive ambulation and ADLs and discharge planning. The patient is planning to be discharged home.   Patient's anticipated LOS is less than 2 midnights, meeting these requirements: - Younger than 48 - Lives within 1 hour of care - Has a competent adult at home to recover with post-op recover - NO history of  - Chronic pain requiring opiods  - Diabetes  - Coronary Artery Disease  - Heart failure  - Heart attack  - Stroke  - DVT/VTE  - Cardiac arrhythmia  - Respiratory Failure/COPD  - Renal failure  - Anemia  - Advanced Liver disease    Rosina Calin, PA-C Orthopedic Surgery [facility] [place] [phone]      [1]  Allergies Allergen Reactions   Clarithromycin Hives   C[place]alt Rash    Found on Allergy Test   Etodolac  Other (See Comments)    Gastritis    Parabens Rash   Red Dye #40 (Allura Red) Hives and Rash    Found on allergy test   Sulfonamide Derivatives Hives

## 2024-12-29 NOTE — Plan of Care (Signed)
" °  Problem: Education: Goal: Knowledge of the prescribed therapeutic regimen will improve Outcome: Progressing   Problem: Bowel/Gastric: Goal: Gastrointestinal status for postoperative course will improve Outcome: Progressing   Problem: Cardiac: Goal: Ability to maintain an adequate cardiac output Outcome: Progressing Goal: Will show no evidence of cardiac arrhythmias Outcome: Progressing   Problem: Nutritional: Goal: Will attain and maintain optimal nutritional status Outcome: Completed/Met   Problem: Neurological: Goal: Will regain or maintain usual level of consciousness Outcome: Progressing   Problem: Clinical Measurements: Goal: Ability to maintain clinical measurements within normal limits Outcome: Progressing Goal: Postoperative complications will be avoided or minimized Outcome: Progressing   Problem: Respiratory: Goal: Will regain and/or maintain adequate ventilation Outcome: Progressing Goal: Respiratory status will improve Outcome: Progressing   Problem: Skin Integrity: Goal: Demonstrates signs of wound healing without infection Outcome: Progressing   Problem: Urinary Elimination: Goal: Will remain free from infection Outcome: Progressing Goal: Ability to achieve and maintain adequate urine output Outcome: Progressing   Problem: Education: Goal: Knowledge of General Education information will improve Description: Including pain rating scale, medication(s)/side effects and non-pharmacologic comfort measures Outcome: Progressing   Problem: Health Behavior/Discharge Planning: Goal: Ability to manage health-related needs will improve Outcome: Progressing   Problem: Clinical Measurements: Goal: Ability to maintain clinical measurements within normal limits will improve Outcome: Progressing Goal: Will remain free from infection Outcome: Progressing Goal: Diagnostic test results will improve Outcome: Progressing Goal: Respiratory complications will  improve Outcome: Progressing Goal: Cardiovascular complication will be avoided Outcome: Progressing   Problem: Activity: Goal: Risk for activity intolerance will decrease Outcome: Progressing   Problem: Nutrition: Goal: Adequate nutrition will be maintained Outcome: Completed/Met   Problem: Coping: Goal: Level of anxiety will decrease Outcome: Progressing   Problem: Elimination: Goal: Will not experience complications related to bowel motility Outcome: Progressing Goal: Will not experience complications related to urinary retention Outcome: Progressing   Problem: Pain Managment: Goal: General experience of comfort will improve and/or be controlled Outcome: Progressing   Problem: Safety: Goal: Ability to remain free from injury will improve Outcome: Progressing   Problem: Skin Integrity: Goal: Risk for impaired skin integrity will decrease Outcome: Progressing   Problem: Education: Goal: Knowledge of the prescribed therapeutic regimen will improve Outcome: Progressing Goal: Individualized Educational Video(s) Outcome: Completed/Met   Problem: Activity: Goal: Ability to avoid complications of mobility impairment will improve Outcome: Progressing Goal: Range of joint motion will improve Outcome: Adequate for Discharge   Problem: Clinical Measurements: Goal: Postoperative complications will be avoided or minimized Outcome: Progressing   Problem: Pain Management: Goal: Pain level will decrease with appropriate interventions Outcome: Progressing   Problem: Skin Integrity: Goal: Will show signs of wound healing Outcome: Progressing   "

## 2024-12-29 NOTE — Op Note (Signed)
 " NAME:  Anna Ellis                      MEDICAL RECORD NO.:  [id number]                             FACILITY:  [facility]      PHYSICIAN:  Donnice BIRCH. Ernie, M.D.  DATE OF BIRTH:  [birth year]      DATE OF PROCEDURE:  12/29/2024                                     OPERATIVE REPORT         PREOPERATIVE DIAGNOSIS:  left knee osteoarthritis.      POSTOPERATIVE DIAGNOSIS:  left knee osteoarthritis.      FINDINGS:  The patient was noted to have complete loss of cartilage and   bone-on-bone arthritis with associated osteophytes in the lateral and patellofemoral compartments of   the knee with a significant synovitis and associated effusion.  The patient had failed months of conservative treatment including medications, injection therapy, activity modification.     PROCEDURE:  left total knee replacement.      COMPONENTS USED:  [employer] Person[employer] nickel free Tivanium CR MC knee system, a size left 6 N femur, left E tibia, size 11 mm CR MC Vivacit E insert, and 35 patellar dome.      SURGEON:  Donnice BIRCH. Ernie, M.D.      ASSISTANT:  Rosina Calin, PA-C.      ANESTHESIA:  Spinal.      SPECIMENS:  None.      COMPLICATION:  None.      DRAINS:  None.  EBL: 250cc      TOURNIQUET TIME:  tourniquet was not used      The patient was stable to the recovery room.      INDICATION FOR PROCEDURE:  Anna Ellis is a 74 y.o. female patient of   mine.  The patient had been seen, evaluated, and treated for months conservatively in the   office with medication, activity modification, and injections.  The patient had   radiographic changes of complete loss of joint space with endplate sclerosis and osteophytes noted.  Based on the radiographic changes and failed conservative measures, the patient   decided to proceed with total knee replacement as definitive treatment.  Risks of infection, DVT, component failure, stiffness and the need for revision surgery, neurovascular injury were reviewed in  the office setting.  The postop course was reviewed stressing the efforts to maximize post-operative range of motion, satisfaction and function.  Consent was obtained for benefit of pain   relief.      PROCEDURE IN DETAIL:  The patient was brought to the operative theater.   Once adequate anesthesia, preoperative antibiotics, 2 gm of Ancef ,1 gm of Tranexamic Acid , and 10 mg of Decadron  administered, the patient was positioned supine with bony prominences padded and protected.  The  left lower extremity was prepped and draped in sterile fashion.  A time-   out was performed identifying the patient, planned procedure, and the appropriate extremity.      The left lower extremity was placed in the [facility] leg holder.  A midline incision was   made followed by median parapatellar arthrotomy.  Following initial   exposure, attention was first directed  to the patella.  Precut   measurement was noted to be 22 mm.  I resected down to 13 mm and used a   35 patellar dome to restore patellar height as well as cover the cut surface.  Limited lateral facetecomy was performed.     The lug holes were drilled and a the trial button was placed to protect the   patella from retractors and saw blade during the procedure.      At this point, attention was now directed to the femur.  The femoral   canal was opened with a drill, irrigated to try to prevent fat emboli.  An   intramedullary rod was passed at 5 degrees valgus, 11 mm of bone was   resected off the distal femur in order to get some resection off the distal lateral femur due to her valgus knee.  Following this resection, the tibia was   subluxated anteriorly.  Using the extramedullary guide, 4 mm of bone was resected off   the proximal medial tibia.  We confirmed the gap would be   stable medially and laterally with a size 10 spacer block as well as confirmed that the tibial cut was perpendicular in the coronal plane, checking with an alignment rod.      Initially using the sizing guide I determined that we would use a size 7 femur.  The drill holes were made and the 4-in-1 cutting block was impacted to the distal femur.  The anterior posterior chamfer cuts were made without complication.  We then placed the trial 7 component. Once this was done the tibia was subluxated anteriorly.  We sized the tibia to be a size E baseplate.  It was pinned into position drilled and keel punched aligned with the medial third of the tibial tubercle.  Again it was noted that we were able to get the 10 spacer block in extension however when I did my initial trial reduction with a size 7 femoral component to flexion gap appeared to be tight.  For this reason I elected to downsize to a size 6 femur.  Using the angel wing with a flat edge placed on the anterior cut of the femur we repositioned the cutting block and impacted the size six 4-in-1 cutting block to the distal femur.  I then revisited the posterior and chamfer cuts.  The size 6 femoral component was then impacted onto the distal femur. At this point we attempted to do a trial reduction.  When I was trying to place a size 10 mm insert onto the tibial tray the medial epicondyle gave way.  There was approximately 1-1/2 to 2 cm segment of bone which appeared to be more cortical in nature with obvious attachment of the medial collateral ligament. At this point based on this I removed the trial components.  I actually resected more of the proximal tibia at this point and added a degree or 2 of slope.  I then prepared the proximal tibia again based on this.  I removed the trial components.  I now focused on this avulsion of the medial epicondyle.  I reapproximated it using a towel clamp initially and then a bone clamp tenaculum.  It was anatomically reduced.  We open the small frag set.  At this point I elected based on the fragment to use fully threaded 4.0 mm noncannulated screws.  The width of her distal femur was greater  than 70 mm.  The first screw replaced was transverse I  then placed divergent screws with 2 of the screws being bicortical.  This medial epicondylar fragment was anatomically reduced now to the medial condyle. Once this was performed we placed the trial components back in the knee.  I first placed a size 6 femoral component.  I then placed the tibial component with a 10 mm insert.  With this I found the knee came to full extension with slight hyperextension but the flexion gap at this point appeared to be match.  During the placement of the components as well as assessment of her range of motion with the patella there was no evidence of any motion of this medial epicondylar piece. Given these findings we opened up the final components.  I elected to use a size 6 narrow Nitrided femoral component, the size left knee tibial baseplate and the 35 patella button.  The final components were opened.  During this period of time the posterior capsule region of the knee was injected with .25% Marcaine  1 cc of Toradol  and saline.  The knee was then irrigated with normal saline solution.  Cement was mixed. The final components were then cemented onto the distal femur and proximal tibia with a 10 mm insert placed.  The knee was brought to extension to allow the cement to cure.  Once the cement fully cured any excessive remaining cement was removed.  Based on the trial reduction I elected to use a size 11 mm insert.  The 11 mm insert to match the left E tibial baseplate as well as a 6 femur was opened.  This was a cruciate retaining medial congruent insert.  Once the final components were placed the knee was reirrigated normal saline solution.  There is no significant hemostasis that was required. The extensor mechanism was reapproximated using a combination #1 Vicryl No. 1 STRATAFIX suture.  The remainder of the wound was closed with 2-0 Vicryl and a running Monocryl stitch.  The wound was cleaned dried with sterile using  surgical glue and Aquacel dressing.  Patient was brought to the recovery room in stable condition tolerated procedure well.  Given the findings of the medial epicondylar avulsion as well as the fixation that was obtained I will likely allow her to work on range of motion but may protect her knee with an economy knee brace in case there is any potential abnormal stresses or strains during her recovery.  Findings were reviewed with her husband but I will review these findings with the patient in the morning.     Please note that PA Patti was present for the entirety of the case, and was utilized for pre-operative positioning, peri-operative retractor management, general facilitation of the procedure and for primary wound closure at the end of the case.              Donnice CORDOBA Ernie, M.D.    12/29/2024 11:29 AM "

## 2024-12-29 NOTE — Transfer of Care (Signed)
 Immediate Anesthesia Transfer of Care Note  Patient: Anna Ellis  Procedure(s) Performed: ARTHROPLASTY, KNEE, TOTAL (Left: Knee)  Patient Location: PACU  Anesthesia Type:MAC and Spinal  Level of Consciousness: awake, alert , oriented, and patient cooperative  Airway & Oxygen Therapy: Patient Spontanous Breathing and Patient connected to face mask oxygen  Post-op Assessment: Report given to RN and Post -op Vital signs reviewed and stable  Post vital signs: Reviewed and stable  Last Vitals:  Vitals Value Taken Time  BP 111/58 12/29/24 11:45  Temp    Pulse 67 12/29/24 11:47  Resp 11 12/29/24 11:47  SpO2 100 % 12/29/24 11:47  Vitals shown include unfiled device data.  Last Pain:  Vitals:   12/29/24 0755  TempSrc:   PainSc: 0-No pain         Complications: No notable events documented.

## 2024-12-29 NOTE — Anesthesia Procedure Notes (Signed)
 Anesthesia Regional Block: Adductor canal block   Pre-Anesthetic Checklist: , timeout performed,  Correct Patient, Correct Site, Correct Laterality,  Correct Procedure, Correct Position, site marked,  Risks and benefits discussed,  Surgical consent,  Pre-op  evaluation,  At surgeon's request and post-op pain management  Laterality: Left and Lower  Prep: chloraprep       Needles:  Injection technique: Single-shot      Needle Length: 9cm  Needle Gauge: 22     Additional Needles: Arrow StimuQuik ECHO Echogenic Stimulating PNB Needle  Procedures:,,,, ultrasound used (permanent image in chart),,    Narrative:  Start time: 12/29/2024 11:19 AM End time: 12/29/2024 11:24 AM Injection made incrementally with aspirations every 5 mL.  Performed by: Personally  Anesthesiologist: Leopoldo Bruckner, MD

## 2024-12-29 NOTE — Discharge Instructions (Signed)

## 2024-12-29 NOTE — Anesthesia Procedure Notes (Signed)
 Procedure Name: MAC Date/Time: 12/29/2024 9:35 AM  Performed by: Memory Armida LABOR, CRNAPre-anesthesia Checklist: Patient identified, Emergency Drugs available, Suction available, Patient being monitored and Timeout performed Patient Re-evaluated:Patient Re-evaluated prior to induction Oxygen Delivery Method: Simple face mask Placement Confirmation: positive ETCO2 Dental Injury: Teeth and Oropharynx as per pre-operative assessment

## 2024-12-30 ENCOUNTER — Other Ambulatory Visit (HOSPITAL_COMMUNITY): Payer: Self-pay

## 2024-12-30 DIAGNOSIS — M1712 Unilateral primary osteoarthritis, left knee: Secondary | ICD-10-CM | POA: Diagnosis not present

## 2024-12-30 LAB — BASIC METABOLIC PANEL WITH GFR
Anion gap: 8 (ref 5–15)
BUN: 10 mg/dL (ref 8–23)
CO2: 23 mmol/L (ref 22–32)
Calcium: 9 mg/dL (ref 8.9–10.3)
Chloride: 109 mmol/L (ref 98–111)
Creatinine, Ser: 0.68 mg/dL (ref 0.44–1.00)
GFR, Estimated: >60 mL/min
Glucose, Bld: 114 mg/dL — ABNORMAL HIGH (ref 70–99)
Potassium: 4 mmol/L (ref 3.5–5.1)
Sodium: 140 mmol/L (ref 135–145)

## 2024-12-30 LAB — CBC
HCT: 33.2 % — ABNORMAL LOW (ref 36.0–46.0)
Hemoglobin: 10.8 g/dL — ABNORMAL LOW (ref 12.0–15.0)
MCH: 31.9 pg (ref 26.0–34.0)
MCHC: 32.5 g/dL (ref 30.0–36.0)
MCV: 97.9 fL (ref 80.0–100.0)
Platelets: 278 K/uL (ref 150–400)
RBC: 3.39 MIL/uL — ABNORMAL LOW (ref 3.87–5.11)
RDW: 13.6 % (ref 11.5–15.5)
WBC: 12.4 K/uL — ABNORMAL HIGH (ref 4.0–10.5)
nRBC: 0 % (ref 0.0–0.2)

## 2024-12-30 MED ORDER — POLYETHYLENE GLYCOL 3350 17 GM/SCOOP PO POWD
17.0000 g | Freq: Two times a day (BID) | ORAL | 0 refills | Status: AC
  Filled 2024-12-30: qty 238, 7d supply, fill #0

## 2024-12-30 MED ORDER — METHOCARBAMOL 500 MG PO TABS
500.0000 mg | ORAL_TABLET | Freq: Four times a day (QID) | ORAL | 0 refills | Status: AC | PRN
  Filled 2024-12-30: qty 40, 10d supply, fill #0

## 2024-12-30 MED ORDER — ONDANSETRON HCL 4 MG PO TABS
4.0000 mg | ORAL_TABLET | Freq: Four times a day (QID) | ORAL | 0 refills | Status: AC | PRN
  Filled 2024-12-30: qty 20, 5d supply, fill #0

## 2024-12-30 MED ORDER — ASPIRIN EC 81 MG PO TBEC
81.0000 mg | DELAYED_RELEASE_TABLET | Freq: Every day | ORAL | 0 refills | Status: AC
  Filled 2024-12-30: qty 28, 28d supply, fill #0

## 2024-12-30 MED ORDER — OXYCODONE HCL 5 MG PO TABS
2.5000 mg | ORAL_TABLET | ORAL | 0 refills | Status: AC | PRN
  Filled 2024-12-30: qty 42, 7d supply, fill #0

## 2024-12-30 MED ORDER — SENNA 8.6 MG PO TABS
2.0000 | ORAL_TABLET | Freq: Every day | ORAL | 0 refills | Status: AC
  Filled 2024-12-30: qty 28, 14d supply, fill #0

## 2024-12-30 NOTE — Progress Notes (Signed)
 Physical Therapy Treatment Patient Details Name: Anna Ellis MRN: [id number] DOB: [birth year] Today's Date: 12/30/2024   History of Present Illness 74 yo female presents to therapy s/p L TKA on 12/29/2024 due to failure of conservative measures. pt with in op medial femoral condyle avulsion injury.  PMH includes but is not limited to: CAD, mitral regurgitation, carotid stenosis, cervical adenopathy and pain, syncope, pacemaker, HTN, OA, HLD, arthritis, and GERD.    PT Comments  Pt progressing very well this session. Education provided to pt and spouse on limiting pivoting/rotational movements, avoiding aggressive knee flexion L knee d/t medial condyle injury. Pt is able to return demonstrate during session. Reviewed mobility as below as well as APs. QS and gentle knee flexion. Pt is meeting goals and is ready to d/c from PT standpoint.    If plan is discharge home, recommend the following: A little help with walking and/or transfers;A little help with bathing/dressing/bathroom;Assistance with cooking/housework;Assist for transportation;Help with stairs or ramp for entrance   Can travel by private vehicle        Equipment Recommendations  Rolling walker (2 wheels)    Recommendations for Other Services       Precautions / Restrictions Precautions Precautions: Knee;Fall Recall of Precautions/Restrictions: Intact Restrictions Weight Bearing Restrictions Per Provider Order: No LLE Weight Bearing Per Provider Order: Weight bearing as tolerated Other Position/Activity Restrictions: avulsion injury of her medial femoral condyle in surgery, allow her generally normal function,  be more gentle in terms of knee flexion--per secure chat with Rosina Calin, PA     Mobility  Bed Mobility Overal bed mobility: Needs Assistance Bed Mobility: Supine to Sit     Supine to sit: Supervision          Transfers Overall transfer level: Needs assistance Equipment used: Rolling walker (2  wheels) Transfers: Sit to/from Stand Sit to Stand: Supervision           General transfer comment: for safety    Ambulation/Gait Ambulation/Gait assistance: Supervision, Contact guard assist Gait Distance (Feet): 120 Feet (15' more) Assistive device: Rolling walker (2 wheels) Gait Pattern/deviations: Step-to pattern, Step-through pattern, Decreased stance time - left Gait velocity: decreased but functional     General Gait Details: cues for sequence, RW position. discussed avoiding pivoting/rotating motions LLE.   Stairs Stairs: Yes Stairs assistance: Contact guard assist, Supervision Stair Management: One rail Right, Step to pattern, Forwards, With cane Number of Stairs: 3 General stair comments: cues for sequence, technique using cane. good stability. no LOB. reinfoced ascending with good leg to avoid stress medial L knee   Wheelchair Mobility     Tilt Bed    Modified Rankin (Stroke Patients Only)       Balance                                            Communication Communication Communication: No apparent difficulties  Cognition Arousal: Alert Behavior During Therapy: WFL for tasks assessed/performed   PT - Cognitive impairments: No apparent impairments                         Following commands: Intact      Cueing    Exercises Total Joint Exercises Ankle Circles/Pumps: AROM, Both, 10 reps Quad Sets: AROM, Both, 5 reps Heel Slides: AROM, Left, 5 reps, Limitations Heel Slides Limitations: educated on gentle  flexion, no agressive ROM/knee flexion per Dr Ernie. pt demonstrates and verbalizes understanding    General Comments        Pertinent Vitals/Pain Pain Assessment Pain Assessment: 0-10 Pain Score: 3  Pain Location: L LE and knee Pain Descriptors / Indicators: Heaviness, Sore Pain Intervention(s): Limited activity within patient's tolerance, Monitored during session, Premedicated before session, Repositioned,  Ice applied    Home Living                          Prior Function            PT Goals (current goals can now be found in the care plan section) Acute Rehab PT Goals Patient Stated Goal: to be able to climb the step stool and attend baseball games this spring PT Goal Formulation: With patient Time For Goal Achievement: 01/12/25 Potential to Achieve Goals: Good Progress towards PT goals: Progressing toward goals    Frequency    7X/week      PT Plan      Co-evaluation              AM-PAC PT 6 Clicks Mobility   Outcome Measure  Help needed turning from your back to your side while in a flat bed without using bedrails?: None Help needed moving from lying on your back to sitting on the side of a flat bed without using bedrails?: A Little Help needed moving to and from a bed to a chair (including a wheelchair)?: A Little Help needed standing up from a chair using your arms (e.g., wheelchair or bedside chair)?: A Little Help needed to walk in hospital room?: A Little Help needed climbing 3-5 steps with a railing? : A Little 6 Click Score: 19    End of Session Equipment Utilized During Treatment: Gait belt Activity Tolerance: Patient tolerated treatment well;No increased pain Patient left: in chair;with call bell/phone within reach;with chair alarm set;with family/visitor present Nurse Communication: Mobility status PT Visit Diagnosis: Unsteadiness on feet (R26.81);Other abnormalities of gait and mobility (R26.89);Muscle weakness (generalized) (M62.81);Difficulty in walking, not elsewhere classified (R26.2);Pain Pain - Right/Left: Left Pain - part of body: Knee;Leg     Time: 9163-9084 PT Time Calculation (min) (ACUTE ONLY): 39 min  Charges:    $Gait Training: 38-52 mins PT General Charges $$ ACUTE PT VISIT: 1 Visit                     Judeen Geralds, PT  Acute Rehab Dept [facility]) [phone]  12/30/2024    [facility] 12/30/2024, 9:26 AM

## 2024-12-30 NOTE — Progress Notes (Signed)
 "  Subjective: 1 Day Post-Op Procedures (LRB): ARTHROPLASTY, KNEE, TOTAL (Left) Patient reports pain as mild.   Patient seen in rounds with Dr. Ernie. Patient is well, and has had no acute complaints or problems. No acute events overnight. Foley catheter removed. Patient ambulated 45 feet with PT. Dr. Ernie discussed the intra-op findings in detail today and showed her the x-rays we obtained yesterday. We will start therapy today.   Objective: Vital signs in last 24 hours: Temp:  [97.1 F (36.2 C)-98.3 F (36.8 C)] 98.3 F (36.8 C) (01/21 0628) Pulse Rate:  [59-97] 61 (01/21 0628) Resp:  [10-18] 18 (01/21 0628) BP: (107-154)/(49-99) 154/70 (01/21 0628) SpO2:  [96 %-100 %] 99 % (01/21 0628) Weight:  [73 kg] 73 kg (01/20 1438)  Intake/Output from previous day:  Intake/Output Summary (Last 24 hours) at 12/30/2024 0823 Last data filed at 12/30/2024 0600 Gross per 24 hour  Intake 4056.52 ml  Output 3250 ml  Net 806.52 ml     Intake/Output this shift: No intake/output data recorded.  Labs: Recent Labs    12/30/24 0359  HGB 10.8*   Recent Labs    12/30/24 0359  WBC 12.4*  RBC 3.39*  HCT 33.2*  PLT 278   Recent Labs    12/30/24 0359  NA 140  K 4.0  CL 109  CO2 23  BUN 10  CREATININE 0.68  GLUCOSE 114*  CALCIUM  9.0   No results for input(s): LABPT, INR in the last 72 hours.  Exam: General - Patient is Alert and Oriented Extremity - Neurologically intact Sensation intact distally Intact pulses distally Dorsiflexion/Plantar flexion intact Dressing - dressing C/D/I Motor Function - intact, moving foot and toes well on exam.   Past Medical History:  Diagnosis Date   ALLERGIC RHINITIS 09/19/2007   Allergy    ANEMIA-IRON DEFICIENCY 09/19/2007   Arthritis    neck (07/10/2018)   Basal cell cancer    back of my neck; scraped it off   CAROTID BRUIT 01/08/2011   GERD (gastroesophageal reflux disease)    High blood pressure    History of gallstones     Hyperlipidemia    Mobitz II    a. s/p MDT dual chamber (His Bundle) pacemaker 07/2018 - Dr Kelsie   OSTEOARTHRITIS 09/19/2007   Pneumonia    PONV (postoperative nausea and vomiting)    Presence of permanent cardiac pacemaker     Assessment/Plan: 1 Day Post-Op Procedures (LRB): ARTHROPLASTY, KNEE, TOTAL (Left) Principal Problem:   S/P total knee arthroplasty, left  Estimated body mass index is 27.62 kg/m as calculated from the following:   Height as of this encounter: 5' 4 (1.626 m).   Weight as of this encounter: 73 kg. Advance diet Up with therapy D/C IV fluids   Patient's anticipated LOS is less than 2 midnights, meeting these requirements: - Younger than 17 - Lives within 1 hour of care - Has a competent adult at home to recover with post-op recover - NO history of  - Chronic pain requiring opiods  - Diabetes  - Coronary Artery Disease  - Heart failure  - Heart attack  - Stroke  - DVT/VTE  - Cardiac arrhythmia  - Respiratory Failure/COPD  - Renal failure  - Anemia  - Advanced Liver disease     DVT Prophylaxis - Aspirin   Able to perform gentle ROM  WBAT Avoid lateral motions/pivoting   We will get her an economy hinge brace on Friday at her first OPPT appointment  Plan  is to go Home after hospital stay. Plan for discharge today following 1-2 sessions of PT as long as they are meeting their goals. Patient is scheduled for OPPT. Follow up in the office in 2 weeks.   Rosina Calin, PA-C Orthopedic Surgery [phone] 12/30/2024, 8:23 AM  "

## 2024-12-30 NOTE — Progress Notes (Signed)
 Discharge medications delivered to patient at the bedside in a secure bag.

## 2024-12-30 NOTE — Progress Notes (Signed)
 Discharge instructions given to patient and family questions asked and answered.

## 2024-12-30 NOTE — TOC Transition Note (Signed)
 Transition of Care [facility]) - Discharge Note   Patient Details  Name: Anna Ellis MRN: [id number] Date of Birth: [birth year]  [facility] CM/SW Contact:  Alfonse JONELLE Rex, RN Phone Number: 12/30/2024, 9:57 AM   Clinical Narrative:   Admitted 12/29/24 for scheduled L  TKA, dc therapy OPPT, RW delivered to bedside by [employer]. No CM needs     Final next level of care: OP Rehab Barriers to Discharge: No Barriers Identified   Patient Goals and CMS Choice Patient states their goals for this hospitalization and ongoing recovery are:: return home          Discharge Placement                       Discharge Plan and Services Additional resources added to the After Visit Summary for                                       Social Drivers of Health (SDOH) Interventions SDOH Screenings   Food Insecurity: No Food Insecurity (12/29/2024)  Housing: Low Risk (12/29/2024)  Transportation Needs: No Transportation Needs (12/29/2024)  Utilities: Not At Risk (12/29/2024)  Depression (PHQ2-9): Low Risk (09/05/2023)  Financial Resource Strain: Low Risk (10/19/2024)  Physical Activity: Unknown (10/19/2024)  Social Connections: Socially Integrated (12/29/2024)  Stress: No Stress Concern Present (10/19/2024)  Tobacco Use: Low Risk (12/29/2024)     Readmission Risk Interventions     No data to display

## 2024-12-31 ENCOUNTER — Encounter (HOSPITAL_COMMUNITY): Payer: Self-pay | Admitting: Orthopedic Surgery

## 2024-12-31 NOTE — Anesthesia Postprocedure Evaluation (Signed)
"   Anesthesia Post Note  Patient: Anna Ellis  Procedure(s) Performed: ARTHROPLASTY, KNEE, TOTAL (Left: Knee)     Patient location during evaluation: PACU Anesthesia Type: MAC, Spinal and Regional Level of consciousness: awake and alert Pain management: pain level controlled Vital Signs Assessment: post-procedure vital signs reviewed and stable Respiratory status: spontaneous breathing, nonlabored ventilation and respiratory function stable Cardiovascular status: stable and blood pressure returned to baseline Postop Assessment: no apparent nausea or vomiting Anesthetic complications: no   No notable events documented.                  Dub Maclellan      "

## 2025-01-07 NOTE — Discharge Summary (Signed)
 Patient ID: Anna Ellis MRN: [id number] DOB/AGE: 76-Feb-1952 76 y.o.  Admit date: 12/29/2024 Discharge date: 12/30/2024  Admission Diagnoses:  Left knee osteoarthritis  Discharge Diagnoses:  Principal Problem:   S/P total knee arthroplasty, left   Past Medical History:  Diagnosis Date   ALLERGIC RHINITIS 09/19/2007   Allergy    ANEMIA-IRON DEFICIENCY 09/19/2007   Arthritis    neck (07/10/2018)   Basal cell cancer    back of my neck; scraped it off   CAROTID BRUIT 01/08/2011   GERD (gastroesophageal reflux disease)    High blood pressure    History of gallstones    Hyperlipidemia    Mobitz II    a. 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 female who was admitted 12/29/2024 for operative treatment ofS/P total knee arthroplasty, left. Patient has severe unremitting pain that affects sleep, daily activities, and work/hobbies. After pre-op  clearance the patient was taken to the operating room on 12/29/2024 and underwent  Procedures: ARTHROPLASTY, KNEE, TOTAL.    Patient was given perioperative antibiotics:  Anti-infectives (From admission, onward)    Start     Dose/Rate Route Frequency Ordered Stop   12/29/24 1600  ceFAZolin  (ANCEF ) IVPB 2g/100 mL premix        2 g 200 mL/hr over 30 Minutes Intravenous Every 6 hours 12/29/24 1430 12/29/24 2311   12/29/24 0745  ceFAZolin  (ANCEF ) IVPB 2g/100 mL premix        2 g 200 mL/hr over 30 Minutes Intravenous On call to O.R. 12/29/24 0740 12/29/24 1016        Patient was given sequential compression devices, early ambulation, and chemoprophylaxis to prevent DVT. Patient worked with PT and was meeting their goals  regarding safe ambulation and transfers.  Patient benefited maximally from hospital stay and there were no complications.    Recent vital signs: No data found.   Recent laboratory studies: No results for input(s): WBC, HGB, HCT, PLT, NA, K, CL, CO2, BUN, CREATININE, GLUCOSE, INR, CALCIUM  in the last 72 hours.  Invalid input(s): PT, 2   Discharge Medications:   Allergies as of 12/30/2024       Reactions   Clarithromycin Hives   Cobalt Rash   Found on Allergy Test   Etodolac  Other (See Comments)   Gastritis    Parabens Rash   Red Dye #40 (allura Red) Hives, Rash   Found on allergy test   Sulfonamide Derivatives Hives        Medication List     STOP taking these medications    cefUROXime  500 MG tablet Commonly known as: CEFTIN    predniSONE  10 MG tablet Commonly known as: DELTASONE        TAKE these medications    acetaminophen  500 MG tablet Commonly known as: TYLENOL  Take 1,000 mg by mouth every 6 (six) hours as needed for moderate pain (pain score 4-6).   ALPRAZolam  0.25 MG tablet Commonly known as: XANAX  Take 1 tablet (0.25 mg total) by mouth 2 (two) times daily as needed for sleep or anxiety.   Aspirin  Low Dose 81 MG tablet Generic drug: aspirin  EC Take 1 tablet (81 mg total) by mouth daily for 28 days.  atorvastatin  10 MG tablet Commonly known as: LIPITOR Take 10 mg by mouth daily.   atorvastatin  20 MG tablet Commonly known as: LIPITOR Take 1 tablet (20 mg total) by mouth daily.   BLINK TEARS OP Place 1 drop into both eyes as needed (dry eyes).   cholecalciferol 25 MCG (1000 UNIT) tablet Commonly known as: VITAMIN D3 Take 1,000 Units by mouth daily.   dicyclomine  10 MG capsule Commonly known as: BENTYL  Take 1 capsule (10 mg total) by mouth 3 (three) times daily as needed for spasms.   famotidine  20 MG tablet Commonly known as: PEPCID  Take 20 mg by mouth at bedtime.   fluticasone  50 MCG/ACT nasal  spray Commonly known as: FLONASE  Place 1 spray into both nostrils daily.   ipratropium 0.06 % nasal spray Commonly known as: ATROVENT  Place 2 sprays into the nose 3 (three) times daily.   losartan  25 MG tablet Commonly known as: COZAAR  Take 25 mg by mouth 2 (two) times daily.   methocarbamol  500 MG tablet Commonly known as: ROBAXIN  Take 1 tablet (500 mg total) by mouth every 6 (six) hours as needed for muscle spasms.   metroNIDAZOLE  0.75 % cream Commonly known as: METROCREAM  Apply 1 application topically daily as needed (for rosacea).   montelukast  10 MG tablet Commonly known as: SINGULAIR  Take 1 tablet (10 mg total) by mouth daily.   mupirocin  ointment 2 % Commonly known as: BACTROBAN  On leg wound w/dressing change qd or bid   nitroGLYCERIN  0.4 MG SL tablet Commonly known as: NITROSTAT  Place 1 tablet (0.4 mg total) under the tongue every 5 (five) minutes as needed for chest pain.   omeprazole  40 MG capsule Commonly known as: PRILOSEC Take 1 capsule (40 mg total) by mouth daily.   omeprazole  20 MG capsule Commonly known as: PRILOSEC Take 20 mg by mouth daily.   ondansetron  4 MG tablet Commonly known as: ZOFRAN  Take 1 tablet (4 mg total) by mouth every 6 (six) hours as needed for nausea.   oxyCODONE  5 MG immediate release tablet Commonly known as: Oxy IR/ROXICODONE  Take 0.5-1 tablets (2.5-5 mg total) by mouth every 4 (four) hours as needed for severe pain (pain score 7-10).   polyethylene glycol powder 17 GM/SCOOP powder Commonly known as: GLYCOLAX /MIRALAX  Take 17 g by mouth 2 (two) times daily. Dissolve 1 capful (17g) in 4-8 ounces of liquid and take by mouth twice daily.   senna 8.6 MG Tabs tablet Commonly known as: SENOKOT Take 2 tablets (17.2 mg total) by mouth at bedtime for 14 days.   SYSTANE OP Place 1 drop into both eyes as needed (dry eyes).   valACYclovir  1000 MG tablet Commonly known as: VALTREX  Take 1 tablet (1,000 mg total) by mouth 3 (three)  times daily.   vitamin E 1000 UNIT capsule Take 1,000 Units by mouth daily.   Xiidra 5 % Soln Generic drug: Lifitegrast Place 1 drop into both eyes in the morning and at bedtime.               Discharge Care Instructions  (From admission, onward)           Start     Ordered   12/30/24 0000  Change dressing       Comments: Maintain surgical dressing until follow up in the clinic. If the edges start to pull up, may reinforce with tape. If the dressing is no longer working, may remove and cover with gauze and tape, but must keep the area dry and clean.  Call with any questions or concerns.   12/30/24 0830            Diagnostic Studies: DG Knee Left Port Result Date: 12/29/2024 CLINICAL DATA:  Status post total knee arthroplasty. EXAM: PORTABLE LEFT KNEE - 1-2 VIEW COMPARISON:  None Available. FINDINGS: Left knee arthroplasty in expected alignment. Additional screws traverse the distal femur. No periprosthetic lucency or fracture. There has been patellar resurfacing. Recent postsurgical change includes air and edema in the soft tissues and joint space. IMPRESSION: Left knee arthroplasty without immediate postoperative complication. Electronically Signed   By: Andrea Gasman M.D.   On: 12/29/2024 14:22    Disposition: Discharge disposition: 01-Home or Self Care       Discharge Instructions     Call MD / Call 911   Complete by: As directed    If you experience chest pain or shortness of breath, CALL 911 and be transported to the hospital emergency room.  If you develope a fever above 101 F, pus (white drainage) or increased drainage or redness at the wound, or calf pain, call your surgeon's office.   Change dressing   Complete by: As directed    Maintain surgical dressing until follow up in the clinic. If the edges start to pull up, may reinforce with tape. If the dressing is no longer working, may remove and cover with gauze and tape, but must keep the area dry and  clean.  Call with any questions or concerns.   Constipation Prevention   Complete by: As directed    Drink plenty of fluids.  Prune juice may be helpful.  You may use a stool softener, such as Colace (over the counter) 100 mg twice a day.  Use MiraLax  (over the counter) for constipation as needed.   Diet - low sodium heart healthy   Complete by: As directed    Increase activity slowly as tolerated   Complete by: As directed    Weight bearing as tolerated with assist device (walker, cane, etc) as directed, use it as long as suggested by your surgeon or therapist, typically at least 4-6 weeks.   Post-operative opioid taper instructions:   Complete by: As directed    POST-OPERATIVE OPIOID TAPER INSTRUCTIONS: It is important to wean off of your opioid medication as soon as possible. If you do not need pain medication after your surgery it is ok to stop day one. Opioids include: Codeine, Hydrocodone(Norco, Vicodin), Oxycodone (Percocet, oxycontin ) and hydromorphone  amongst others.  Long term and even short term use of opiods can cause: Increased pain response Dependence Constipation Depression Respiratory depression And more.  Withdrawal symptoms can include Flu like symptoms Nausea, vomiting And more Techniques to manage these symptoms Hydrate well Eat regular healthy meals Stay active Use relaxation techniques(deep breathing, meditating, yoga) Do Not substitute Alcohol to help with tapering If you have been on opioids for less than two weeks and do not have pain than it is ok to stop all together.  Plan to wean off of opioids This plan should start within one week post op of your joint replacement. Maintain the same interval or time between taking each dose and first decrease the dose.  Cut the total daily intake of opioids by one tablet each day Next start to increase the time between doses. The last dose that should be eliminated is the evening dose.      TED hose   Complete  by: As directed    Use stockings (TED hose) for 2  weeks on both leg(s).  You may remove them at night for sleeping.        Follow-up Information     Ernie Cough, MD. Schedule an appointment as soon as possible for a visit in 2 week(s).   Specialty: Orthopedic Surgery Contact information: [address] [place] [address] [phone]                  Signed: Rosina JONELLE Calin 01/07/2025, 10:55 AM

## 2025-01-12 ENCOUNTER — Other Ambulatory Visit: Payer: Self-pay | Admitting: Internal Medicine

## 2025-01-14 ENCOUNTER — Ambulatory Visit

## 2025-01-15 LAB — CUP PACEART REMOTE DEVICE CHECK
Battery Remaining Longevity: 38 mo
Battery Voltage: 2.93 V
Brady Statistic AP VP Percent: 31.81 %
Brady Statistic AP VS Percent: 0 %
Brady Statistic AS VP Percent: 67.99 %
Brady Statistic AS VS Percent: 0.2 %
Brady Statistic RA Percent Paced: 31.82 %
Brady Statistic RV Percent Paced: 99.8 %
Date Time Interrogation Session: 20260204202248
Implantable Lead Connection Status: 753985
Implantable Lead Connection Status: 753985
Implantable Lead Implant Date: 20190801
Implantable Lead Implant Date: 20190801
Implantable Lead Location: 753859
Implantable Lead Location: 753860
Implantable Lead Model: 3830
Implantable Lead Model: 5076
Implantable Pulse Generator Implant Date: 20190801
Lead Channel Impedance Value: 323 Ohm
Lead Channel Impedance Value: 361 Ohm
Lead Channel Impedance Value: 380 Ohm
Lead Channel Impedance Value: 456 Ohm
Lead Channel Pacing Threshold Amplitude: 0.75 V
Lead Channel Pacing Threshold Amplitude: 0.75 V
Lead Channel Pacing Threshold Pulse Width: 0.4 ms
Lead Channel Pacing Threshold Pulse Width: 0.4 ms
Lead Channel Sensing Intrinsic Amplitude: 4.875 mV
Lead Channel Sensing Intrinsic Amplitude: 4.875 mV
Lead Channel Sensing Intrinsic Amplitude: 8.5 mV
Lead Channel Sensing Intrinsic Amplitude: 8.5 mV
Lead Channel Setting Pacing Amplitude: 2 V
Lead Channel Setting Pacing Amplitude: 2.5 V
Lead Channel Setting Pacing Pulse Width: 0.4 ms
Lead Channel Setting Sensing Sensitivity: 1.2 mV
Zone Setting Status: 755011
Zone Setting Status: 755011

## 2025-02-17 ENCOUNTER — Ambulatory Visit: Admitting: Cardiovascular Disease

## 2025-04-15 ENCOUNTER — Ambulatory Visit

## 2025-07-15 ENCOUNTER — Ambulatory Visit

## 2025-10-14 ENCOUNTER — Ambulatory Visit

## 2026-01-13 ENCOUNTER — Ambulatory Visit
